# Patient Record
Sex: Female | Born: 1955
Health system: Southern US, Community
[De-identification: ages and names within clinical notes are randomized; demographics above are authoritative.]

## PROBLEM LIST (undated history)

## (undated) DIAGNOSIS — E079 Disorder of thyroid, unspecified: Secondary | ICD-10-CM

## (undated) DIAGNOSIS — E119 Type 2 diabetes mellitus without complications: Secondary | ICD-10-CM

## (undated) DIAGNOSIS — E785 Hyperlipidemia, unspecified: Secondary | ICD-10-CM

## (undated) DIAGNOSIS — I8390 Asymptomatic varicose veins of unspecified lower extremity: Secondary | ICD-10-CM

## (undated) DIAGNOSIS — E039 Hypothyroidism, unspecified: Secondary | ICD-10-CM

## (undated) DIAGNOSIS — C541 Malignant neoplasm of endometrium: Secondary | ICD-10-CM

## (undated) DIAGNOSIS — M8580 Other specified disorders of bone density and structure, unspecified site: Secondary | ICD-10-CM

## (undated) DIAGNOSIS — I10 Essential (primary) hypertension: Secondary | ICD-10-CM

## (undated) DIAGNOSIS — E559 Vitamin D deficiency, unspecified: Secondary | ICD-10-CM

## (undated) DIAGNOSIS — D509 Iron deficiency anemia, unspecified: Secondary | ICD-10-CM

## (undated) DIAGNOSIS — Z780 Asymptomatic menopausal state: Secondary | ICD-10-CM

## (undated) HISTORY — DX: Vitamin D deficiency, unspecified: E55.9

## (undated) HISTORY — DX: Asymptomatic varicose veins of unspecified lower extremity: I83.90

## (undated) HISTORY — DX: Hyperlipidemia, unspecified: E78.5

## (undated) HISTORY — DX: Malignant neoplasm of endometrium: C54.1

## (undated) HISTORY — DX: Asymptomatic menopausal state: Z78.0

## (undated) HISTORY — DX: Iron deficiency anemia, unspecified: D50.9

## (undated) HISTORY — DX: Essential (primary) hypertension: I10

## (undated) HISTORY — DX: Type 2 diabetes mellitus without complications: E11.9

## (undated) HISTORY — DX: Other specified disorders of bone density and structure, unspecified site: M85.80

## (undated) HISTORY — DX: Disorder of thyroid, unspecified: E07.9

---

## 1988-03-04 DIAGNOSIS — I739 Peripheral vascular disease, unspecified: Secondary | ICD-10-CM

## 1988-03-04 HISTORY — DX: Peripheral vascular disease, unspecified: I73.9

## 1997-04-07 ENCOUNTER — Ambulatory Visit (HOSPITAL_COMMUNITY): Admission: RE | Admit: 1997-04-07 | Discharge: 1997-04-07 | Payer: Self-pay | Admitting: *Deleted

## 1997-05-10 ENCOUNTER — Other Ambulatory Visit: Admission: RE | Admit: 1997-05-10 | Discharge: 1997-05-10 | Payer: Self-pay | Admitting: *Deleted

## 2000-10-24 ENCOUNTER — Encounter: Payer: Self-pay | Admitting: Internal Medicine

## 2000-10-24 ENCOUNTER — Ambulatory Visit (HOSPITAL_COMMUNITY): Admission: RE | Admit: 2000-10-24 | Discharge: 2000-10-24 | Payer: Self-pay | Admitting: Internal Medicine

## 2003-09-06 ENCOUNTER — Other Ambulatory Visit: Admission: RE | Admit: 2003-09-06 | Discharge: 2003-09-06 | Payer: Self-pay | Admitting: *Deleted

## 2004-02-15 ENCOUNTER — Encounter: Payer: Self-pay | Admitting: Internal Medicine

## 2004-03-15 ENCOUNTER — Ambulatory Visit (HOSPITAL_COMMUNITY): Admission: RE | Admit: 2004-03-15 | Discharge: 2004-03-15 | Payer: Self-pay | Admitting: Internal Medicine

## 2006-12-09 ENCOUNTER — Other Ambulatory Visit: Admission: RE | Admit: 2006-12-09 | Discharge: 2006-12-09 | Payer: Self-pay | Admitting: Internal Medicine

## 2008-10-18 ENCOUNTER — Encounter: Payer: Self-pay | Admitting: Internal Medicine

## 2008-11-30 ENCOUNTER — Ambulatory Visit: Payer: Self-pay | Admitting: Internal Medicine

## 2008-12-13 ENCOUNTER — Encounter: Payer: Self-pay | Admitting: Internal Medicine

## 2008-12-13 ENCOUNTER — Ambulatory Visit: Payer: Self-pay | Admitting: Internal Medicine

## 2008-12-13 HISTORY — PX: COLONOSCOPY: SHX174

## 2008-12-15 ENCOUNTER — Encounter: Payer: Self-pay | Admitting: Internal Medicine

## 2009-01-04 ENCOUNTER — Ambulatory Visit: Payer: Self-pay | Admitting: Oncology

## 2009-01-16 LAB — CMP (CANCER CENTER ONLY)
ALT(SGPT): 22 U/L (ref 10–47)
Albumin: 3.9 g/dL (ref 3.3–5.5)
CO2: 28 mEq/L (ref 18–33)
Calcium: 9.4 mg/dL (ref 8.0–10.3)
Chloride: 103 mEq/L (ref 98–108)
Creat: 0.6 mg/dl (ref 0.6–1.2)
Potassium: 3.9 mEq/L (ref 3.3–4.7)
Sodium: 139 mEq/L (ref 128–145)
Total Protein: 7.3 g/dL (ref 6.4–8.1)

## 2009-01-16 LAB — MORPHOLOGY - CHCC SATELLITE: PLT EST ~~LOC~~: INCREASED

## 2009-01-16 LAB — CBC WITH DIFFERENTIAL (CANCER CENTER ONLY)
Eosinophils Absolute: 0.1 10*3/uL (ref 0.0–0.5)
LYMPH#: 1.8 10*3/uL (ref 0.9–3.3)
MONO#: 0.4 10*3/uL (ref 0.1–0.9)
NEUT#: 5 10*3/uL (ref 1.5–6.5)
Platelets: 407 10*3/uL — ABNORMAL HIGH (ref 145–400)
RBC: 5.02 10*6/uL (ref 3.70–5.32)
WBC: 7.4 10*3/uL (ref 3.9–10.0)

## 2009-01-18 LAB — ERYTHROPOIETIN: Erythropoietin: 63.3 m[IU]/mL — ABNORMAL HIGH (ref 2.6–34.0)

## 2009-01-18 LAB — PROTEIN ELECTROPHORESIS, SERUM
Alpha-1-Globulin: 4.5 % (ref 2.9–4.9)
Alpha-2-Globulin: 10.2 % (ref 7.1–11.8)
Total Protein, Serum Electrophoresis: 7 g/dL (ref 6.0–8.3)

## 2009-01-18 LAB — RETICULOCYTES (CHCC)
ABS Retic: 51 10*3/uL (ref 19.0–186.0)
RBC.: 5.1 MIL/uL (ref 3.87–5.11)
Retic Ct Pct: 1 % (ref 0.4–3.1)

## 2009-01-18 LAB — VITAMIN B12: Vitamin B-12: 526 pg/mL (ref 211–911)

## 2009-01-18 LAB — LACTATE DEHYDROGENASE: LDH: 143 U/L (ref 94–250)

## 2009-01-18 LAB — HEMOGLOBINOPATHY EVALUATION
Hemoglobin Other: 0 % (ref 0.0–0.0)
Hgb S Quant: 0 % (ref 0.0–0.0)

## 2009-02-15 ENCOUNTER — Emergency Department (HOSPITAL_BASED_OUTPATIENT_CLINIC_OR_DEPARTMENT_OTHER): Admission: EM | Admit: 2009-02-15 | Discharge: 2009-02-15 | Payer: Self-pay | Admitting: Emergency Medicine

## 2009-02-15 ENCOUNTER — Ambulatory Visit: Payer: Self-pay | Admitting: Diagnostic Radiology

## 2009-02-15 IMAGING — CT CT PELVIS W/ CM
2 of 4 series · 16 of 46 positions shown, 18 images · IV contrast (APPLIED)
Comparison: None.

CT ABDOMEN

CLINICAL DATA: Right groin pain, leg pain.

CT ABDOMEN AND PELVIS WITH CONTRAST
TECHNIQUE: Multidetector CT imaging of the abdomen and pelvis was
performed using the standard protocol following bolus
administration of intravenous contrast.
Contrast: 100 ml Omnipaque 300 IV.

[Series 2: abd/pelvis 5.0 b31f · axial · 0.65mm/px · z∈[+894,+1334]mm · 13 of 98 slices shown, 15 images]
[im 5/98  soft-tissue]
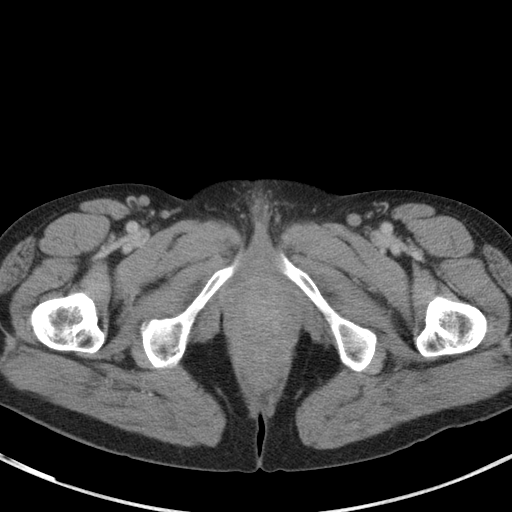
[im 5/98  bone]
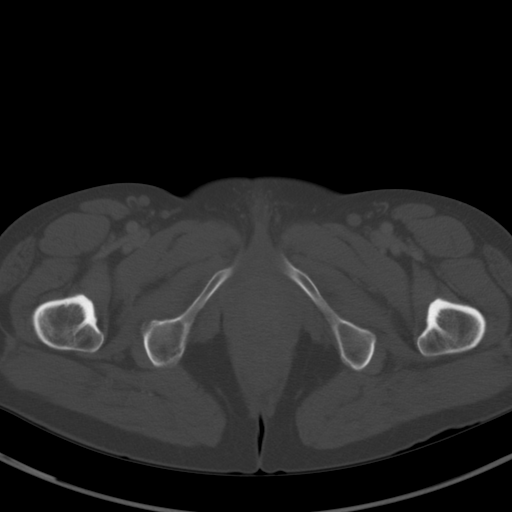
[im 13/98  soft-tissue]
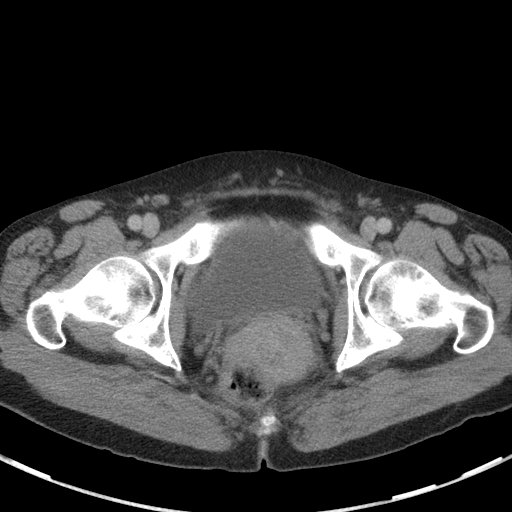
[im 22/98  soft-tissue]
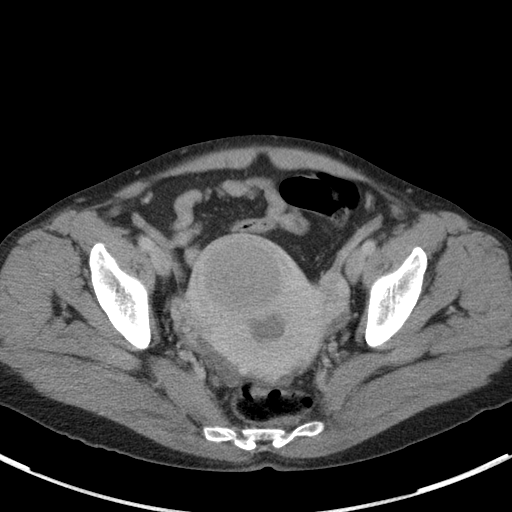
[im 26/98  soft-tissue]
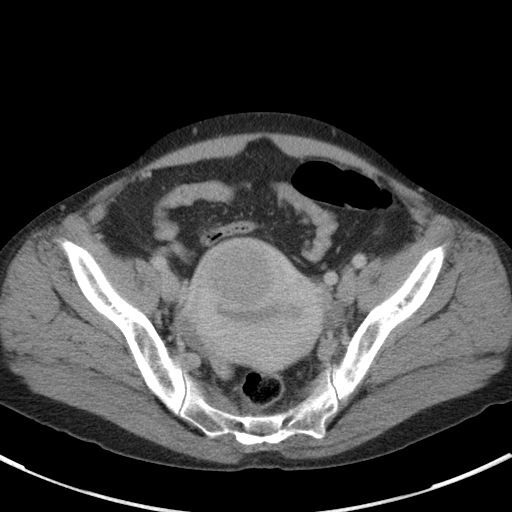
[im 34/98  soft-tissue]
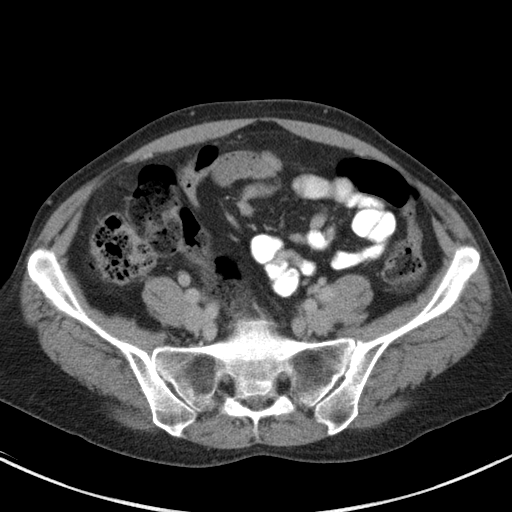
[im 43/98  soft-tissue]
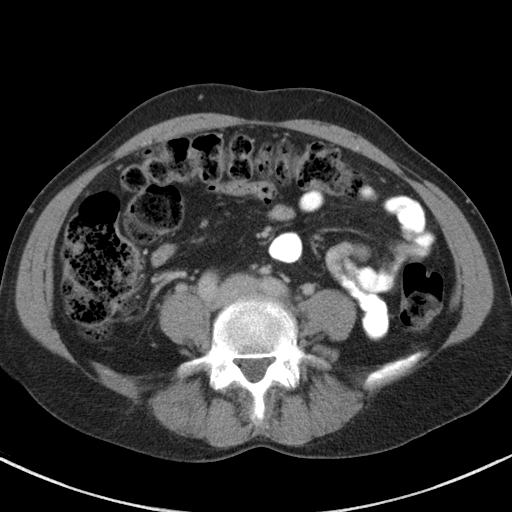
[im 51/98  soft-tissue]
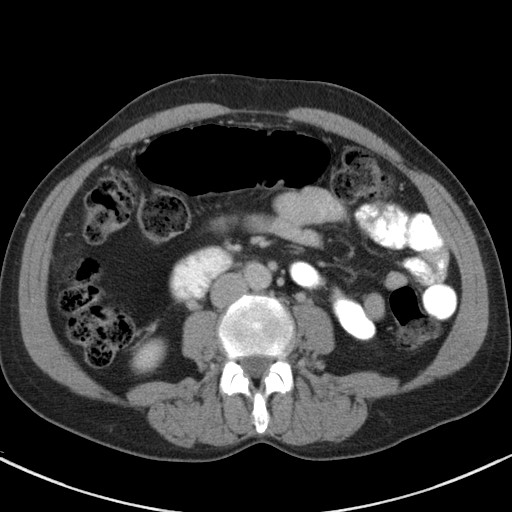
[im 55/98  soft-tissue]
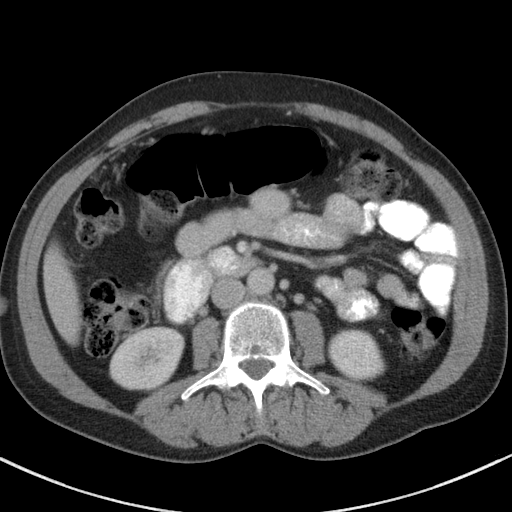
[im 64/98  soft-tissue]
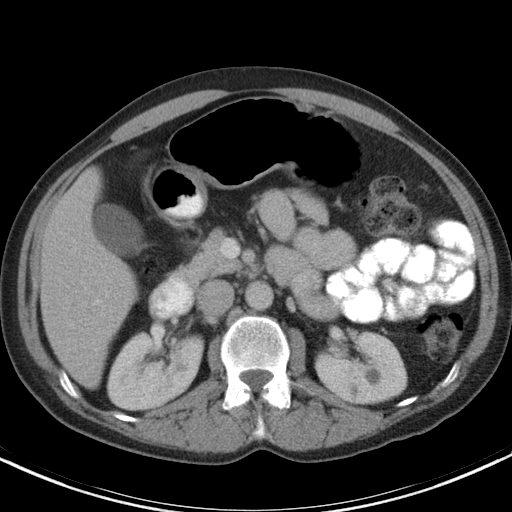
[im 64/98  bone]
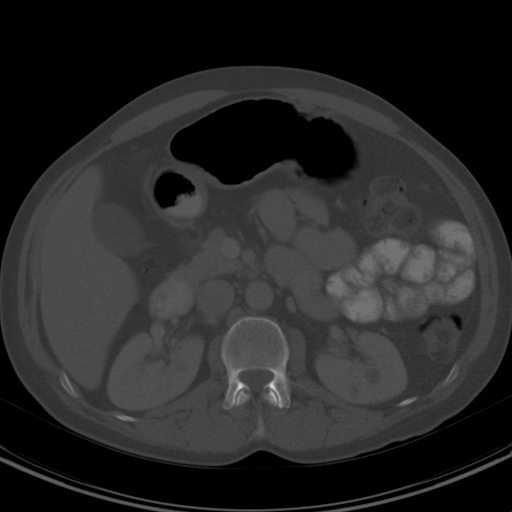
[im 72/98  soft-tissue]
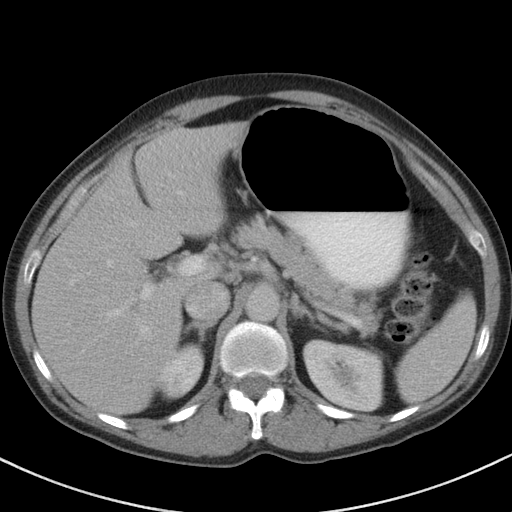
[im 76/98  soft-tissue]
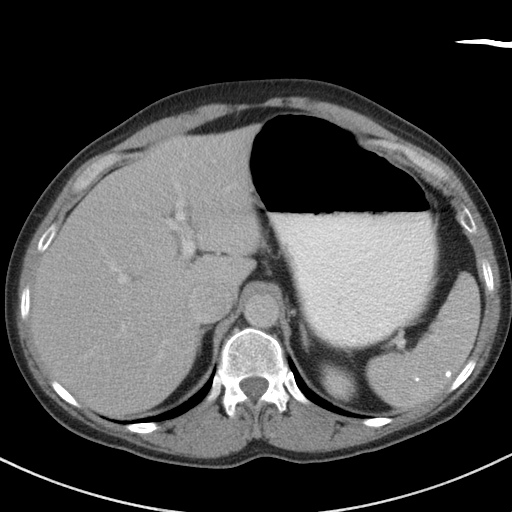
[im 85/98  soft-tissue]
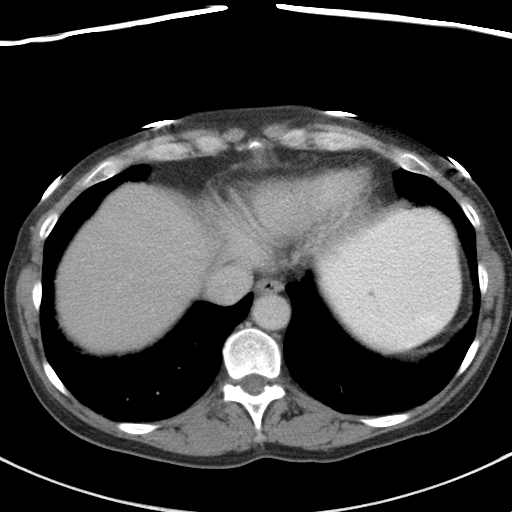
[im 93/98  soft-tissue]
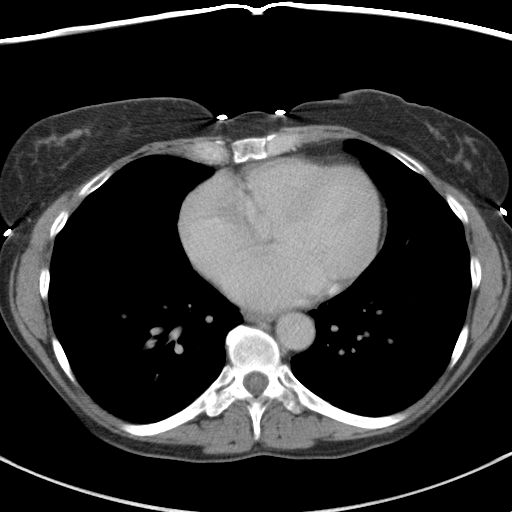

[Series 5: abd/pelvis 3.0 coronal · coronal · 0.67mm/px · 3 of 84 slices shown]
[im 28/84  soft-tissue]
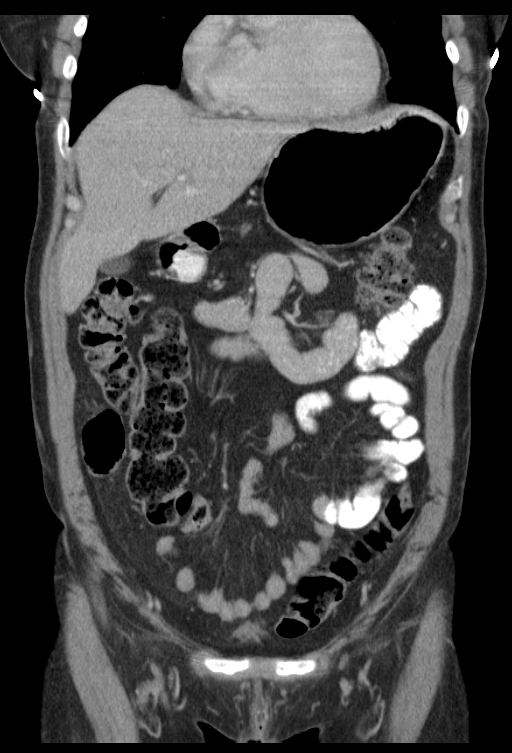
[im 37/84  soft-tissue]
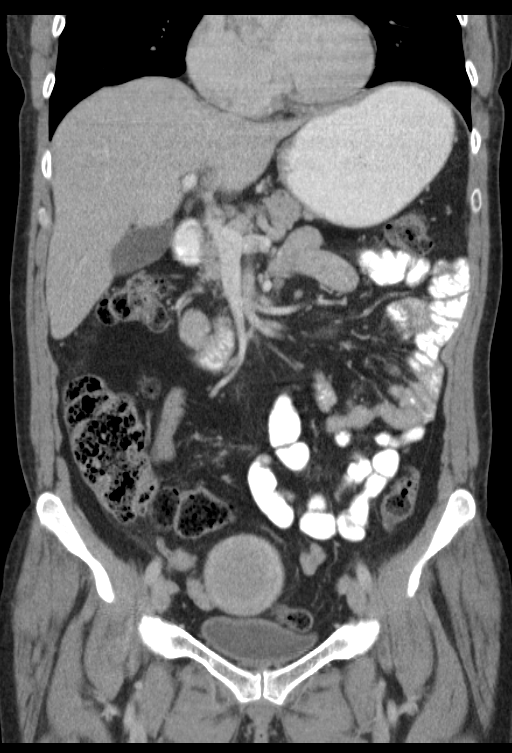
[im 47/84  soft-tissue]
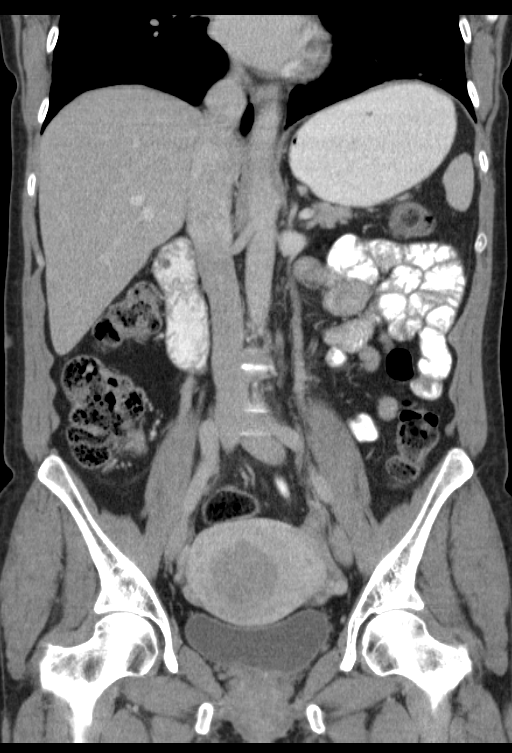

[16 of 46 positions shown; findings below may reference images not displayed]

FINDINGS: Linear atelectasis in the lung bases.  No effusions.
Heart is normal size.

Calcifications seen within the spleen compatible with old
granulomatous disease.  Liver, pancreas, adrenals unremarkable.
Small bilateral low-density lesions within the kidneys, likely
small cysts.  No stones or hydronephrosis.

Gallbladder unremarkable.  Large amount of stool throughout the
colon.  Small bowel unremarkable.

Small scattered retroperitoneal lymph nodes, none pathologically
enlarged.  Aorta is normal caliber.
IMPRESSION: No acute findings in the abdomen.

Old granulomatous disease within the spleen.

Small bilateral renal cysts.

CT PELVIS
FINDINGS: A large hypodense mass within the fundus of the uterus
measuring 5 cm compatible with fibroid. Small exophytic fibroid
also noted. There is fluid within the endometrium.  Small amount of
free fluid in the pelvis.

Ovaries unremarkable.  Appendix is visualized and is normal.
Moderate stool throughout the colon. Pelvic large and small bowel
grossly unremarkable.

No abnormality in the right groin region.  Small scattered
bilateral inguinal lymph nodes.

No acute bony abnormality.
IMPRESSION: Uterine fibroids.

Fluid within the endometrium.

Moderate stool burden throughout the colon.

## 2009-02-21 ENCOUNTER — Ambulatory Visit: Payer: Self-pay | Admitting: Oncology

## 2009-02-23 LAB — CBC WITH DIFFERENTIAL (CANCER CENTER ONLY)
BASO%: 0.6 % (ref 0.0–2.0)
Eosinophils Absolute: 0.1 10*3/uL (ref 0.0–0.5)
HCT: 39.3 % (ref 34.8–46.6)
LYMPH#: 2 10*3/uL (ref 0.9–3.3)
LYMPH%: 30.8 % (ref 14.0–48.0)
MCV: 76 fL — ABNORMAL LOW (ref 81–101)
MONO#: 0.4 10*3/uL (ref 0.1–0.9)
Platelets: 276 10*3/uL (ref 145–400)
RBC: 5.17 10*6/uL (ref 3.70–5.32)
RDW: 23.1 % — ABNORMAL HIGH (ref 10.5–14.6)
WBC: 6.6 10*3/uL (ref 3.9–10.0)

## 2009-03-21 ENCOUNTER — Ambulatory Visit: Payer: Self-pay | Admitting: Oncology

## 2009-05-25 ENCOUNTER — Ambulatory Visit: Payer: Self-pay | Admitting: Oncology

## 2009-05-30 LAB — CBC WITH DIFFERENTIAL (CANCER CENTER ONLY)
BASO%: 0.7 % (ref 0.0–2.0)
EOS%: 1.6 % (ref 0.0–7.0)
LYMPH%: 31.2 % (ref 14.0–48.0)
MCH: 27.8 pg (ref 26.0–34.0)
MCHC: 32.7 g/dL (ref 32.0–36.0)
MCV: 85 fL (ref 81–101)
MONO%: 4.3 % (ref 0.0–13.0)
Platelets: 255 10*3/uL (ref 145–400)
RDW: 12.4 % (ref 10.5–14.6)
WBC: 6.3 10*3/uL (ref 3.9–10.0)

## 2009-05-30 LAB — IRON AND TIBC
%SAT: 32 % (ref 20–55)
Iron: 129 ug/dL (ref 42–145)
TIBC: 406 ug/dL (ref 250–470)
UIBC: 277 ug/dL

## 2009-05-30 LAB — FERRITIN: Ferritin: 20 ng/mL (ref 10–291)

## 2009-06-21 ENCOUNTER — Ambulatory Visit: Payer: Self-pay | Admitting: Oncology

## 2009-06-29 LAB — IRON AND TIBC
%SAT: 80 % — ABNORMAL HIGH (ref 20–55)
Iron: 366 ug/dL — ABNORMAL HIGH (ref 42–145)
TIBC: 457 ug/dL (ref 250–470)

## 2009-06-29 LAB — CBC WITH DIFFERENTIAL (CANCER CENTER ONLY)
BASO#: 0 10*3/uL (ref 0.0–0.2)
EOS%: 1.8 % (ref 0.0–7.0)
Eosinophils Absolute: 0.1 10*3/uL (ref 0.0–0.5)
HCT: 37.3 % (ref 34.8–46.6)
HGB: 12.5 g/dL (ref 11.6–15.9)
LYMPH#: 1.5 10*3/uL (ref 0.9–3.3)
MONO#: 0.3 10*3/uL (ref 0.1–0.9)
NEUT#: 2.8 10*3/uL (ref 1.5–6.5)
NEUT%: 58.9 % (ref 39.6–80.0)
RBC: 4.46 10*6/uL (ref 3.70–5.32)
WBC: 4.8 10*3/uL (ref 3.9–10.0)

## 2009-06-29 LAB — RETICULOCYTES (CHCC)
ABS Retic: 58.1 10*3/uL (ref 19.0–186.0)
RBC.: 4.47 MIL/uL (ref 3.87–5.11)

## 2009-08-07 ENCOUNTER — Ambulatory Visit: Payer: Self-pay | Admitting: Oncology

## 2009-08-11 LAB — CBC WITH DIFFERENTIAL (CANCER CENTER ONLY)
BASO#: 0 10*3/uL (ref 0.0–0.2)
Eosinophils Absolute: 0.1 10*3/uL (ref 0.0–0.5)
HCT: 38.8 % (ref 34.8–46.6)
LYMPH%: 30.5 % (ref 14.0–48.0)
MCH: 27.7 pg (ref 26.0–34.0)
MCV: 84 fL (ref 81–101)
MONO#: 0.4 10*3/uL (ref 0.1–0.9)
MONO%: 5.8 % (ref 0.0–13.0)
NEUT%: 61.7 % (ref 39.6–80.0)
Platelets: 264 10*3/uL (ref 145–400)
RBC: 4.65 10*6/uL (ref 3.70–5.32)

## 2009-08-11 LAB — IRON AND TIBC
%SAT: 19 % — ABNORMAL LOW (ref 20–55)
Iron: 72 ug/dL (ref 42–145)

## 2009-08-11 LAB — FERRITIN: Ferritin: 11 ng/mL (ref 10–291)

## 2009-11-02 ENCOUNTER — Ambulatory Visit: Payer: Self-pay | Admitting: Oncology

## 2009-12-22 ENCOUNTER — Ambulatory Visit: Payer: Self-pay | Admitting: Oncology

## 2009-12-28 LAB — CBC WITH DIFFERENTIAL (CANCER CENTER ONLY)
BASO%: 0.5 % (ref 0.0–2.0)
EOS%: 1.9 % (ref 0.0–7.0)
LYMPH#: 2.1 10*3/uL (ref 0.9–3.3)
MCH: 28.9 pg (ref 26.0–34.0)
MCHC: 34.1 g/dL (ref 32.0–36.0)
MONO%: 6.2 % (ref 0.0–13.0)
NEUT#: 3.4 10*3/uL (ref 1.5–6.5)
NEUT%: 56.9 % (ref 39.6–80.0)
RDW: 12.1 % (ref 10.5–14.6)

## 2009-12-28 LAB — IRON AND TIBC: TIBC: 428 ug/dL (ref 250–470)

## 2010-02-05 ENCOUNTER — Ambulatory Visit: Payer: Self-pay | Admitting: Oncology

## 2010-02-06 LAB — CBC WITH DIFFERENTIAL/PLATELET
Basophils Absolute: 0 10*3/uL (ref 0.0–0.1)
EOS%: 0.7 % (ref 0.0–7.0)
Eosinophils Absolute: 0.1 10*3/uL (ref 0.0–0.5)
HCT: 39.2 % (ref 34.8–46.6)
HGB: 13 g/dL (ref 11.6–15.9)
MCH: 29.2 pg (ref 25.1–34.0)
MCV: 88.1 fL (ref 79.5–101.0)
MONO%: 6.2 % (ref 0.0–14.0)
NEUT%: 71.2 % (ref 38.4–76.8)

## 2010-02-06 LAB — FERRITIN: Ferritin: 84 ng/mL (ref 10–291)

## 2010-02-06 LAB — IRON AND TIBC: TIBC: 318 ug/dL (ref 250–470)

## 2010-03-25 ENCOUNTER — Encounter: Payer: Self-pay | Admitting: Internal Medicine

## 2010-05-24 ENCOUNTER — Encounter (HOSPITAL_BASED_OUTPATIENT_CLINIC_OR_DEPARTMENT_OTHER): Payer: BC Managed Care – PPO | Admitting: Oncology

## 2010-05-24 ENCOUNTER — Other Ambulatory Visit: Payer: Self-pay | Admitting: Oncology

## 2010-05-24 DIAGNOSIS — E039 Hypothyroidism, unspecified: Secondary | ICD-10-CM

## 2010-05-24 LAB — CBC WITH DIFFERENTIAL/PLATELET
BASO%: 0.4 % (ref 0.0–2.0)
Eosinophils Absolute: 0 10*3/uL (ref 0.0–0.5)
HCT: 39 % (ref 34.8–46.6)
LYMPH%: 24.4 % (ref 14.0–49.7)
MONO#: 0.5 10*3/uL (ref 0.1–0.9)
NEUT#: 4.4 10*3/uL (ref 1.5–6.5)
Platelets: 257 10*3/uL (ref 145–400)
RBC: 4.48 10*6/uL (ref 3.70–5.45)
WBC: 6.6 10*3/uL (ref 3.9–10.3)
lymph#: 1.6 10*3/uL (ref 0.9–3.3)

## 2010-05-24 LAB — IRON AND TIBC
%SAT: 35 % (ref 20–55)
TIBC: 401 ug/dL (ref 250–470)
UIBC: 260 ug/dL

## 2010-05-24 LAB — FERRITIN: Ferritin: 8 ng/mL — ABNORMAL LOW (ref 10–291)

## 2010-06-05 LAB — URINALYSIS, ROUTINE W REFLEX MICROSCOPIC
Nitrite: NEGATIVE
Protein, ur: NEGATIVE mg/dL
Specific Gravity, Urine: 1.016 (ref 1.005–1.030)
Urobilinogen, UA: 0.2 mg/dL (ref 0.0–1.0)

## 2010-06-05 LAB — DIFFERENTIAL
Basophils Absolute: 0 10*3/uL (ref 0.0–0.1)
Basophils Relative: 0 % (ref 0–1)
Lymphocytes Relative: 10 % — ABNORMAL LOW (ref 12–46)
Neutro Abs: 7.7 10*3/uL (ref 1.7–7.7)
Neutrophils Relative %: 88 % — ABNORMAL HIGH (ref 43–77)

## 2010-06-05 LAB — BASIC METABOLIC PANEL
BUN: 16 mg/dL (ref 6–23)
Calcium: 9.9 mg/dL (ref 8.4–10.5)
Creatinine, Ser: 0.7 mg/dL (ref 0.4–1.2)
GFR calc non Af Amer: >60 mL/min (ref >60)
Glucose, Bld: 137 mg/dL — ABNORMAL HIGH (ref 70–99)
Sodium: 143 mEq/L (ref 135–145)

## 2010-06-05 LAB — CBC
Platelets: 247 10*3/uL (ref 150–400)
RDW: 24.6 % — ABNORMAL HIGH (ref 11.5–15.5)

## 2010-06-05 LAB — PREGNANCY, URINE: Preg Test, Ur: NEGATIVE

## 2010-06-05 LAB — URINE MICROSCOPIC-ADD ON

## 2010-06-07 ENCOUNTER — Other Ambulatory Visit: Payer: Self-pay | Admitting: Oncology

## 2010-06-07 ENCOUNTER — Encounter (HOSPITAL_BASED_OUTPATIENT_CLINIC_OR_DEPARTMENT_OTHER): Payer: BC Managed Care – PPO | Admitting: Oncology

## 2010-06-07 DIAGNOSIS — D509 Iron deficiency anemia, unspecified: Secondary | ICD-10-CM

## 2010-06-07 DIAGNOSIS — E039 Hypothyroidism, unspecified: Secondary | ICD-10-CM

## 2010-06-07 LAB — CBC & DIFF AND RETIC
BASO%: 0.8 % (ref 0.0–2.0)
EOS%: 0.8 % (ref 0.0–7.0)
Immature Retic Fract: 2.4 % (ref 0.00–10.70)
LYMPH%: 26.1 % (ref 14.0–49.7)
MCH: 28.2 pg (ref 25.1–34.0)
MCHC: 33.2 g/dL (ref 31.5–36.0)
MONO#: 0.6 10*3/uL (ref 0.1–0.9)
Platelets: 266 10*3/uL (ref 145–400)
RBC: 4.93 10*6/uL (ref 3.70–5.45)
WBC: 9.1 10*3/uL (ref 3.9–10.3)
lymph#: 2.4 10*3/uL (ref 0.9–3.3)
nRBC: 0 % (ref 0–0)

## 2010-06-07 LAB — IRON AND TIBC
%SAT: 72 % — ABNORMAL HIGH (ref 20–55)
TIBC: 897 ug/dL — ABNORMAL HIGH (ref 250–470)

## 2010-06-14 ENCOUNTER — Encounter (HOSPITAL_BASED_OUTPATIENT_CLINIC_OR_DEPARTMENT_OTHER): Payer: BC Managed Care – PPO | Admitting: Oncology

## 2010-06-14 DIAGNOSIS — D509 Iron deficiency anemia, unspecified: Secondary | ICD-10-CM

## 2010-08-20 ENCOUNTER — Emergency Department (INDEPENDENT_AMBULATORY_CARE_PROVIDER_SITE_OTHER): Payer: BC Managed Care – PPO

## 2010-08-20 ENCOUNTER — Emergency Department (HOSPITAL_BASED_OUTPATIENT_CLINIC_OR_DEPARTMENT_OTHER)
Admission: EM | Admit: 2010-08-20 | Discharge: 2010-08-20 | Disposition: A | Discharge status: Home / Self Care | Payer: BC Managed Care – PPO | Attending: Emergency Medicine | Admitting: Emergency Medicine

## 2010-08-20 DIAGNOSIS — R079 Chest pain, unspecified: Secondary | ICD-10-CM

## 2010-08-20 DIAGNOSIS — E039 Hypothyroidism, unspecified: Secondary | ICD-10-CM | POA: Insufficient documentation

## 2010-08-20 DIAGNOSIS — M79609 Pain in unspecified limb: Secondary | ICD-10-CM | POA: Insufficient documentation

## 2010-08-20 LAB — CK TOTAL AND CKMB (NOT AT ARMC)
CK, MB: 3 ng/mL (ref 0.3–4.0)
Relative Index: INVALID (ref 0.0–2.5)
Total CK: 92 U/L (ref 7–177)

## 2010-08-20 LAB — DIFFERENTIAL
Basophils Absolute: 0 10*3/uL (ref 0.0–0.1)
Basophils Relative: 0 % (ref 0–1)
Lymphocytes Relative: 26 % (ref 12–46)
Monocytes Absolute: 0.4 10*3/uL (ref 0.1–1.0)
Neutro Abs: 4.1 10*3/uL (ref 1.7–7.7)
Neutrophils Relative %: 67 % (ref 43–77)

## 2010-08-20 LAB — BASIC METABOLIC PANEL
Creatinine, Ser: 0.5 mg/dL (ref 0.50–1.10)
Glucose, Bld: 107 mg/dL — ABNORMAL HIGH (ref 70–99)
Potassium: 3.7 mEq/L (ref 3.5–5.1)
Sodium: 140 mEq/L (ref 135–145)

## 2010-08-20 LAB — TROPONIN I: Troponin I: <0.3 ng/mL (ref <0.30)

## 2010-08-20 LAB — CBC
HCT: 26.1 % — ABNORMAL LOW (ref 36.0–46.0)
Hemoglobin: 8.6 g/dL — ABNORMAL LOW (ref 12.0–15.0)
RBC: 3.05 MIL/uL — ABNORMAL LOW (ref 3.87–5.11)
WBC: 6.1 10*3/uL (ref 4.0–10.5)

## 2010-09-12 ENCOUNTER — Encounter (HOSPITAL_BASED_OUTPATIENT_CLINIC_OR_DEPARTMENT_OTHER): Payer: BC Managed Care – PPO | Admitting: Oncology

## 2010-09-12 ENCOUNTER — Other Ambulatory Visit: Payer: Self-pay | Admitting: Oncology

## 2010-09-12 DIAGNOSIS — D509 Iron deficiency anemia, unspecified: Secondary | ICD-10-CM

## 2010-09-12 DIAGNOSIS — E039 Hypothyroidism, unspecified: Secondary | ICD-10-CM

## 2010-09-12 LAB — CBC & DIFF AND RETIC
BASO%: 0.6 % (ref 0.0–2.0)
HCT: 37.5 % (ref 34.8–46.6)
LYMPH%: 26.6 % (ref 14.0–49.7)
MCH: 28.5 pg (ref 25.1–34.0)
MCHC: 31.7 g/dL (ref 31.5–36.0)
MONO#: 0.5 10*3/uL (ref 0.1–0.9)
NEUT%: 64.5 % (ref 38.4–76.8)
Platelets: 270 10*3/uL (ref 145–400)
WBC: 6.7 10*3/uL (ref 3.9–10.3)

## 2010-09-12 LAB — IRON AND TIBC
%SAT: 16 % — ABNORMAL LOW (ref 20–55)
TIBC: 390 ug/dL (ref 250–470)
UIBC: 328 ug/dL

## 2010-09-12 LAB — FERRITIN: Ferritin: 14 ng/mL (ref 10–291)

## 2010-12-13 ENCOUNTER — Other Ambulatory Visit: Payer: Self-pay | Admitting: Oncology

## 2010-12-13 ENCOUNTER — Encounter (HOSPITAL_BASED_OUTPATIENT_CLINIC_OR_DEPARTMENT_OTHER): Payer: BC Managed Care – PPO | Admitting: Oncology

## 2010-12-13 DIAGNOSIS — D509 Iron deficiency anemia, unspecified: Secondary | ICD-10-CM

## 2010-12-13 DIAGNOSIS — E039 Hypothyroidism, unspecified: Secondary | ICD-10-CM

## 2010-12-13 LAB — CBC WITH DIFFERENTIAL/PLATELET
Eosinophils Absolute: 0.1 10*3/uL (ref 0.0–0.5)
HCT: 36.8 % (ref 34.8–46.6)
HGB: 12 g/dL (ref 11.6–15.9)
LYMPH%: 33.2 % (ref 14.0–49.7)
MONO#: 0.5 10*3/uL (ref 0.1–0.9)
NEUT#: 2.5 10*3/uL (ref 1.5–6.5)
NEUT%: 53.8 % (ref 38.4–76.8)
Platelets: 307 10*3/uL (ref 145–400)
RBC: 4.26 10*6/uL (ref 3.70–5.45)
WBC: 4.7 10*3/uL (ref 3.9–10.3)

## 2010-12-13 LAB — IRON AND TIBC
%SAT: 44 % (ref 20–55)
TIBC: 413 ug/dL (ref 250–470)

## 2010-12-13 LAB — FERRITIN: Ferritin: 8 ng/mL — ABNORMAL LOW (ref 10–291)

## 2010-12-19 ENCOUNTER — Other Ambulatory Visit: Payer: Self-pay | Admitting: Obstetrics & Gynecology

## 2010-12-19 DIAGNOSIS — R928 Other abnormal and inconclusive findings on diagnostic imaging of breast: Secondary | ICD-10-CM

## 2010-12-27 ENCOUNTER — Other Ambulatory Visit: Payer: BC Managed Care – PPO

## 2011-01-16 ENCOUNTER — Telehealth: Payer: Self-pay | Admitting: *Deleted

## 2011-01-16 ENCOUNTER — Ambulatory Visit
Admission: RE | Admit: 2011-01-16 | Discharge: 2011-01-16 | Disposition: A | Discharge status: Home / Self Care | Payer: BC Managed Care – PPO | Source: Ambulatory Visit | Attending: Obstetrics & Gynecology | Admitting: Obstetrics & Gynecology

## 2011-01-16 DIAGNOSIS — R928 Other abnormal and inconclusive findings on diagnostic imaging of breast: Secondary | ICD-10-CM

## 2011-01-16 NOTE — Telephone Encounter (Signed)
Mailed out calendar to inform patient of the new date and time of the 07-19-2011 appointment

## 2011-01-17 ENCOUNTER — Encounter: Payer: Self-pay | Admitting: Oncology

## 2011-01-17 ENCOUNTER — Other Ambulatory Visit: Payer: Self-pay | Admitting: Oncology

## 2011-01-17 DIAGNOSIS — D509 Iron deficiency anemia, unspecified: Secondary | ICD-10-CM

## 2011-01-17 HISTORY — DX: Iron deficiency anemia, unspecified: D50.9

## 2011-01-18 ENCOUNTER — Telehealth: Payer: Self-pay | Admitting: *Deleted

## 2011-01-18 NOTE — Telephone Encounter (Signed)
GAVE PATIENT APPOINTMENT FOR 07-2011.  

## 2011-01-21 ENCOUNTER — Telehealth: Payer: Self-pay | Admitting: Oncology

## 2011-01-21 NOTE — Telephone Encounter (Signed)
called pt lmovm for appt on 11/23

## 2011-01-22 ENCOUNTER — Telehealth: Payer: Self-pay | Admitting: Oncology

## 2011-01-22 ENCOUNTER — Telehealth: Payer: Self-pay | Admitting: *Deleted

## 2011-01-22 NOTE — Telephone Encounter (Signed)
Per MD notified pt of appt on 01/25/11 at 1015 for Fereheme Injection. Request pt call back to confirm msg was received.

## 2011-01-22 NOTE — Telephone Encounter (Signed)
pt called and confirmed appt for 11/23

## 2011-01-25 ENCOUNTER — Ambulatory Visit (HOSPITAL_BASED_OUTPATIENT_CLINIC_OR_DEPARTMENT_OTHER): Payer: BC Managed Care – PPO

## 2011-01-25 VITALS — BP 115/74 | HR 65 | Temp 98.3°F

## 2011-01-25 DIAGNOSIS — D509 Iron deficiency anemia, unspecified: Secondary | ICD-10-CM

## 2011-01-25 MED ORDER — FERUMOXYTOL INJECTION 510 MG/17 ML
510.0000 mg | Freq: Once | INTRAVENOUS | Status: AC
  Administered 2011-01-25: 510 mg via INTRAVENOUS
  Filled 2011-01-25: qty 17

## 2011-01-25 MED ORDER — SODIUM CHLORIDE 0.9 % IV SOLN
Freq: Once | INTRAVENOUS | Status: AC
  Administered 2011-01-25: 10:00:00 via INTRAVENOUS

## 2011-01-25 NOTE — Patient Instructions (Signed)
1037-Pt discharged ambulatory with next appointment confirmed.  Pt aware to call with any questions or concerns.

## 2011-02-13 ENCOUNTER — Other Ambulatory Visit: Payer: Self-pay | Admitting: Obstetrics & Gynecology

## 2011-07-18 ENCOUNTER — Other Ambulatory Visit: Payer: Self-pay | Admitting: Family

## 2011-07-18 DIAGNOSIS — D509 Iron deficiency anemia, unspecified: Secondary | ICD-10-CM

## 2011-07-19 ENCOUNTER — Other Ambulatory Visit (HOSPITAL_BASED_OUTPATIENT_CLINIC_OR_DEPARTMENT_OTHER): Payer: BC Managed Care – PPO | Admitting: Lab

## 2011-07-19 ENCOUNTER — Telehealth: Payer: Self-pay | Admitting: *Deleted

## 2011-07-19 ENCOUNTER — Encounter: Payer: Self-pay | Admitting: Family

## 2011-07-19 ENCOUNTER — Ambulatory Visit (HOSPITAL_BASED_OUTPATIENT_CLINIC_OR_DEPARTMENT_OTHER): Payer: BC Managed Care – PPO | Admitting: Family

## 2011-07-19 VITALS — BP 127/89 | HR 76 | Temp 98.6°F | Ht 64.7 in | Wt 141.3 lb

## 2011-07-19 DIAGNOSIS — D509 Iron deficiency anemia, unspecified: Secondary | ICD-10-CM

## 2011-07-19 DIAGNOSIS — E039 Hypothyroidism, unspecified: Secondary | ICD-10-CM

## 2011-07-19 LAB — CBC WITH DIFFERENTIAL/PLATELET
BASO%: 1 % (ref 0.0–2.0)
EOS%: 0.7 % (ref 0.0–7.0)
HGB: 14 g/dL (ref 11.6–15.9)
MCH: 28.9 pg (ref 25.1–34.0)
MCHC: 33.3 g/dL (ref 31.5–36.0)
RBC: 4.84 10*6/uL (ref 3.70–5.45)
RDW: 12.8 % (ref 11.2–14.5)
lymph#: 1.4 10*3/uL (ref 0.9–3.3)
nRBC: 0 % (ref 0–0)

## 2011-07-19 LAB — IRON AND TIBC
Iron: 78 ug/dL (ref 42–145)
UIBC: 287 ug/dL (ref 125–400)

## 2011-07-19 NOTE — Progress Notes (Signed)
[facility]  Name: Mary Frank                  DATE: 07/19/2011 MRN: [id number]                      DOB: 09-Jun-1955  REFERRING PHYSICIAN: Nadean Corwin,*  DIAGNOSIS: Patient Active Problem List  Diagnoses Date Noted  . Iron deficiency anemia 01/17/2011     Encounter Diagnosis  Name Primary?  . Iron deficiency anemia Yes    PREVIOUS THERAPY: Feraheme as indicated, last received 01/25/11.   CURRENT THERAPY: Oral iron 325 mg daily.   INTERIM HISTORY: Feels well, states she can usually tell when her iron is low, and that she does not feel low at present. Denies dyspnea or fatigue. No headache or blurred vision. No cough, no abdominal pain or bone pain. Bowel and bladder function are normal, no constipation or GI upset from oral iron. Appetite is good, with adequate fluid intake. Remainder of the 10 point  review of systems is negative.  PHYSICAL EXAM: BP 127/89  Pulse 76  Temp(Src) 98.6 F (37 C) (Oral)  Ht 5' 4.7" (1.643 m)  Wt 141 lb 4.8 oz (64.093 kg)  BMI 23.73 kg/m2 General: Well developed, well nourished, in no acute distress.  EENT: No ocular or oral lesions. No stomatitis.  Respiratory: Lungs are clear to auscultation bilaterally with normal respiratory movement and no accessory muscle use. Cardiac: No murmur, rub or tachycardia. No upper or lower extremity edema.  GI: Abdomen is soft, no palpable hepatosplenomegaly. No fluid wave. No tenderness. Musculoskeletal: No kyphosis, no tenderness over the spine, ribs or hips. Lymph: No cervical, infraclavicular, axillary or inguinal adenopathy. Neuro: No focal neurological deficits. Psych: Alert and oriented X 3, appropriate mood and affect.    LABORATORY STUDIES:   Results for orders placed in visit on 07/19/11  CBC WITH DIFFERENTIAL      Component Value Range   WBC 6.2  3.9 - 10.3 (10e3/uL)   NEUT# 4.2  1.5 - 6.5 (10e3/uL)   HGB 14.0  11.6 - 15.9 (g/dL)   HCT 14.7  82.9 - 56.2 (%)   Platelets  231  145 - 400 (10e3/uL)   MCV 86.8  79.5 - 101.0 (fL)   MCH 28.9  25.1 - 34.0 (pg)   MCHC 33.3  31.5 - 36.0 (g/dL)   RBC 1.30  8.65 - 7.84 (10e6/uL)   RDW 12.8  11.2 - 14.5 (%)   lymph# 1.4  0.9 - 3.3 (10e3/uL)   MONO# 0.5  0.1 - 0.9 (10e3/uL)   Eosinophils Absolute 0.0  0.0 - 0.5 (10e3/uL)   Basophils Absolute 0.1  0.0 - 0.1 (10e3/uL)   NEUT% 68.3  38.4 - 76.8 (%)   LYMPH% 22.7  14.0 - 49.7 (%)   MONO% 7.3  0.0 - 14.0 (%)   EOS% 0.7  0.0 - 7.0 (%)   BASO% 1.0  0.0 - 2.0 (%)   nRBC 0  0 - 0 (%)    IMPRESSION:  56 y/o female with:  1. History iron deficiency anemia, requiring Feraheme infusion in the past.  2. Blood counts stable, possibly attributable to oral iron intake.  3. Abnormal mammogram October 2012, ultrasound showed no area suspicious for malignancy. Short term (6 month) follow-up recommended.   PLAN:   1. Continue on oral iron. 2. Return in 6 months for lab and appt with Dr. Welton Flakes.  3. She prefers to follow-up  with primary care to arrange short interval follow-up breast ultrasound as [facility].   DISCUSSION:

## 2011-07-19 NOTE — Telephone Encounter (Signed)
gave patient appointment for 01-13-2012 starting at 10:00am printed out calendar and gave to the patient

## 2011-10-22 ENCOUNTER — Encounter: Payer: Self-pay | Admitting: Internal Medicine

## 2012-01-13 ENCOUNTER — Other Ambulatory Visit (HOSPITAL_BASED_OUTPATIENT_CLINIC_OR_DEPARTMENT_OTHER): Payer: BC Managed Care – PPO | Admitting: Lab

## 2012-01-13 ENCOUNTER — Telehealth: Payer: Self-pay | Admitting: Oncology

## 2012-01-13 ENCOUNTER — Encounter: Payer: Self-pay | Admitting: Oncology

## 2012-01-13 ENCOUNTER — Ambulatory Visit (HOSPITAL_BASED_OUTPATIENT_CLINIC_OR_DEPARTMENT_OTHER): Payer: BC Managed Care – PPO | Admitting: Oncology

## 2012-01-13 VITALS — BP 171/82 | HR 74 | Temp 98.1°F | Resp 20 | Ht 64.5 in | Wt 142.5 lb

## 2012-01-13 DIAGNOSIS — D509 Iron deficiency anemia, unspecified: Secondary | ICD-10-CM

## 2012-01-13 LAB — CBC WITH DIFFERENTIAL/PLATELET
BASO%: 0.6 % (ref 0.0–2.0)
Basophils Absolute: 0 10*3/uL (ref 0.0–0.1)
EOS%: 0.6 % (ref 0.0–7.0)
HCT: 41.9 % (ref 34.8–46.6)
HGB: 13.7 g/dL (ref 11.6–15.9)
LYMPH%: 21.8 % (ref 14.0–49.7)
MCH: 28.4 pg (ref 25.1–34.0)
MCHC: 32.7 g/dL (ref 31.5–36.0)
NEUT%: 70.2 % (ref 38.4–76.8)
Platelets: 242 10*3/uL (ref 145–400)

## 2012-01-13 NOTE — Patient Instructions (Addendum)
Doing well labs look stable  We will see you back in May 2014

## 2012-01-13 NOTE — Progress Notes (Signed)
OFFICE PROGRESS NOTE  CC  MCKEOWN,WILLIAM DAVID, MD [address] [place] [address]  DIAGNOSIS: 56 year old female with iron deficiency anemia  PRIOR THERAPY:  #1 patient has received parenteral iron in the past. She is also on oral iron daily.  CURRENT THERAPY: Oral iron 325 mg daily  INTERVAL HISTORY: Mary Frank 56 y.o. female returns for followup visit at 6 months. Clinically she seems to be doing well without any significant complaints. She is denying any fevers chills night sweats headaches shortness of breath chest pains palpitations no bowel or bladder changes. No hematuria hematochezia or melena hemoptysis or hematemesis. Remainder of the 10 point review of systems is negative.  MEDICAL HISTORY: Past Medical History  Diagnosis Date  . Iron deficiency anemia 01/17/2011    ALLERGIES:   has no known allergies.  MEDICATIONS:  Current Outpatient Prescriptions  Medication Sig Dispense Refill  . Magnesium 100 MG CAPS Take 1 each by mouth.      . ferrous fumarate (HEMOCYTE - 106 MG FE) 325 (106 FE) MG TABS Take 1 tablet by mouth daily.      Marland Kitchen PREMARIN 0.625 MG tablet       . progesterone (PROMETRIUM) 200 MG capsule         SURGICAL HISTORY: No past surgical history on file.  REVIEW OF SYSTEMS:  Pertinent items are noted in HPI.   HEALTH MAINTENANCE:  PHYSICAL EXAMINATION: Blood pressure 171/82, pulse 74, temperature 98.1 F (36.7 C), temperature source Oral, resp. rate 20, height 5' 4.5" (1.638 m), weight 142 lb 8 oz (64.638 kg). Body mass index is 24.08 kg/(m^2). ECOG PERFORMANCE STATUS: 0 - Asymptomatic   General appearance: alert, cooperative and appears stated age Lymph nodes: Cervical, supraclavicular, and axillary nodes normal. Resp: clear to auscultation bilaterally Back: symmetric, no curvature. ROM normal. No CVA tenderness. Cardio: regular rate and rhythm GI: soft, non-tender; bowel sounds normal; no masses,  no  organomegaly Extremities: extremities normal, atraumatic, no cyanosis or edema Neurologic: Grossly normal   LABORATORY DATA: Lab Results  Component Value Date   WBC 7.2 01/13/2012   HGB 13.7 01/13/2012   HCT 41.9 01/13/2012   MCV 86.7 01/13/2012   PLT 242 01/13/2012      Chemistry      Component Value Date/Time   NA 140 08/20/2010 1310   NA 139 01/16/2009 1135   K 3.7 08/20/2010 1310   K 3.9 01/16/2009 1135   CL 106 08/20/2010 1310   CL 103 01/16/2009 1135   CO2 24 08/20/2010 1310   CO2 28 01/16/2009 1135   BUN 13 08/20/2010 1310   BUN 13 01/16/2009 1135   CREATININE 0.50 08/20/2010 1310   CREATININE 0.6 01/16/2009 1135      Component Value Date/Time   CALCIUM 8.9 08/20/2010 1310   CALCIUM 9.4 01/16/2009 1135   ALKPHOS 120* 01/16/2009 1135   AST 19 01/16/2009 1135   BILITOT 0.40 01/16/2009 1135       RADIOGRAPHIC STUDIES:  No results found.  ASSESSMENT: 56 year old female with iron deficiency anemia. Patient is on oral iron 325 mg daily. However in order to keep with her losses we'll also give her parenteral iron as needed to keep her ferritin above 100. Her hemoglobin today looks great iron studies are pending.   PLAN:   #1 we will followup on her iron studies. If her ferritin is low we will go ahead and call her and get her set up for fera heme.  #2 she will be set  up to see [place] back in 6 months time with iron studies and a CBC.   All questions were answered. The patient knows to call the clinic with any problems, questions or concerns. We can certainly see the patient much sooner if necessary.  I spent 15 minutes counseling the patient face to face. The total time spent in the appointment was 30 minutes.    Drue Second, MD Medical/Oncology [facility] [phone] (beeper) [phone] (Office)  01/13/2012, 10:35 AM

## 2012-01-13 NOTE — Telephone Encounter (Signed)
gve the pt her may 2014 appt calendar 

## 2012-06-25 ENCOUNTER — Encounter: Payer: Self-pay | Admitting: Internal Medicine

## 2012-07-16 ENCOUNTER — Other Ambulatory Visit: Payer: BC Managed Care – PPO | Admitting: Lab

## 2012-07-16 ENCOUNTER — Ambulatory Visit: Payer: BC Managed Care – PPO | Admitting: Adult Health

## 2013-02-03 ENCOUNTER — Encounter: Payer: Self-pay | Admitting: Internal Medicine

## 2013-02-04 ENCOUNTER — Ambulatory Visit (INDEPENDENT_AMBULATORY_CARE_PROVIDER_SITE_OTHER): Payer: BC Managed Care – PPO | Admitting: Internal Medicine

## 2013-02-04 ENCOUNTER — Encounter: Payer: Self-pay | Admitting: Internal Medicine

## 2013-02-04 VITALS — BP 118/70 | HR 66 | Temp 97.9°F | Resp 16 | Ht 64.75 in | Wt 141.8 lb

## 2013-02-04 DIAGNOSIS — Z1212 Encounter for screening for malignant neoplasm of rectum: Secondary | ICD-10-CM

## 2013-02-04 DIAGNOSIS — Z113 Encounter for screening for infections with a predominantly sexual mode of transmission: Secondary | ICD-10-CM

## 2013-02-04 DIAGNOSIS — E119 Type 2 diabetes mellitus without complications: Secondary | ICD-10-CM

## 2013-02-04 DIAGNOSIS — Z79899 Other long term (current) drug therapy: Secondary | ICD-10-CM

## 2013-02-04 DIAGNOSIS — E559 Vitamin D deficiency, unspecified: Secondary | ICD-10-CM

## 2013-02-04 DIAGNOSIS — E139 Other specified diabetes mellitus without complications: Secondary | ICD-10-CM | POA: Insufficient documentation

## 2013-02-04 DIAGNOSIS — E039 Hypothyroidism, unspecified: Secondary | ICD-10-CM

## 2013-02-04 DIAGNOSIS — R7402 Elevation of levels of lactic acid dehydrogenase (LDH): Secondary | ICD-10-CM

## 2013-02-04 DIAGNOSIS — E1169 Type 2 diabetes mellitus with other specified complication: Secondary | ICD-10-CM | POA: Insufficient documentation

## 2013-02-04 DIAGNOSIS — I10 Essential (primary) hypertension: Secondary | ICD-10-CM

## 2013-02-04 DIAGNOSIS — E782 Mixed hyperlipidemia: Secondary | ICD-10-CM

## 2013-02-04 DIAGNOSIS — R7309 Other abnormal glucose: Secondary | ICD-10-CM

## 2013-02-04 DIAGNOSIS — Z0000 Encounter for general adult medical examination without abnormal findings: Secondary | ICD-10-CM

## 2013-02-04 HISTORY — DX: Type 2 diabetes mellitus without complications: E11.9

## 2013-02-04 LAB — LIPID PANEL
Cholesterol: 176 mg/dL (ref 0–200)
HDL: 70 mg/dL (ref >39)
LDL Cholesterol: 84 mg/dL (ref 0–99)
Triglycerides: 111 mg/dL (ref <150)
VLDL: 22 mg/dL (ref 0–40)

## 2013-02-04 LAB — CBC WITH DIFFERENTIAL/PLATELET
Basophils Absolute: 0 10*3/uL (ref 0.0–0.1)
Basophils Relative: 1 % (ref 0–1)
HCT: 41.3 % (ref 36.0–46.0)
Lymphocytes Relative: 29 % (ref 12–46)
MCHC: 34.4 g/dL (ref 30.0–36.0)
Neutro Abs: 4.8 10*3/uL (ref 1.7–7.7)
Neutrophils Relative %: 62 % (ref 43–77)
Platelets: 293 10*3/uL (ref 150–400)
RDW: 13.7 % (ref 11.5–15.5)
WBC: 7.5 10*3/uL (ref 4.0–10.5)

## 2013-02-04 LAB — BASIC METABOLIC PANEL WITH GFR
BUN: 15 mg/dL (ref 6–23)
Calcium: 9.4 mg/dL (ref 8.4–10.5)
Chloride: 101 mEq/L (ref 96–112)
Creat: 0.66 mg/dL (ref 0.50–1.10)
GFR, Est African American: >89 mL/min
GFR, Est Non African American: >89 mL/min

## 2013-02-04 LAB — HEPATIC FUNCTION PANEL
ALT: 25 U/L (ref 0–35)
Bilirubin, Direct: 0.1 mg/dL (ref 0.0–0.3)
Indirect Bilirubin: 0.4 mg/dL (ref 0.0–0.9)
Total Bilirubin: 0.5 mg/dL (ref 0.3–1.2)

## 2013-02-04 LAB — HEMOGLOBIN A1C: Mean Plasma Glucose: 194 mg/dL — ABNORMAL HIGH (ref <117)

## 2013-02-04 MED ORDER — METFORMIN HCL ER 500 MG PO TB24: ORAL_TABLET | ORAL | Status: DC

## 2013-02-04 MED ORDER — MEDROXYPROGESTERONE ACETATE 5 MG PO TABS: ORAL_TABLET | ORAL | Status: DC

## 2013-02-04 NOTE — Patient Instructions (Addendum)

## 2013-02-04 NOTE — Progress Notes (Signed)
Patient ID: Mary Frank, female   DOB: 10/06/1955, 57 y.o.   MRN: [id number]  Annual Screening Comprehensive Examination  This very nice 57 yo WWF presents for complete physical.  Patient has been followed for HTN (2004),  T2 Diabetes ( 2003), Hyperlipidemia, and Vitamin D Deficiency.   Patient's BP has been controlled at home. Patient denies any cardiac symptoms as chest pain, palpitations, shortness of breath, dizziness or ankle swelling.   Patient's hyperlipidemia is controlled with diet and medications. Patient denies myalgias or other medication SE's. Last cholesterol last visit was 164, triglycerides   170, HDL 60 and LDL 70 at goal.     Patient has T2 DM predating from 2003 with last A1c 8.5% in September admitting poor dietary compliance at that time. Patient denies reactive hypoglycemic symptoms, visual blurring, diabetic polys, or paresthesias.    She was Dx'd and Tx'd for Graves' Dz with RAI-131 in 1991 and has been on replacement therapy since.   Finally, patient has history of Vitamin D Deficiency with last vitamin D of 70 in September.     Medication Sig Dispense Refill  . atorvastatin (LIPITOR) 80 MG tablet Take 40 mg by mouth daily.      . cholecalciferol (VITAMIN D) 1000 UNITS tablet Take 4,000 Units by mouth daily.      . ferrous fumarate (HEMOCYTE - 106 MG FE) 325 (106 FE) MG TABS Take 1 tablet by mouth daily.      Marland Kitchen levothyroxine (SYNTHROID, LEVOTHROID) 100 MCG tablet Take 200 mcg by mouth daily before breakfast. Take 200 5 times a week. Take 100 2 times a week.      . Magnesium 100 MG CAPS Take 1 each by mouth.      Marland Kitchen PREMARIN 0.625 MG tablet       . progesterone (PROMETRIUM) 200 MG capsule          No Known Allergies  Past Medical History  Diagnosis Date  . Iron deficiency anemia 01/17/2011  . Hypertension   . Hyperlipidemia   . Diabetes mellitus without complication   . Thyroid disease   . Vitamin D deficiency     Past Surgical History  Procedure  Laterality Date  . Vein ligation and stripping Left     Family History  Problem Relation Age of Onset  . Diabetes Mother   . Thyroid disease Mother   . Diabetes Father   . Osteoporosis Father   . Thyroid disease Sister     History  Substance Use Topics  . Smoking status: Former Smoker -- 1.00 packs/day    Quit date: 03/20/2010  . Smokeless tobacco: Not on file  . Alcohol Use: Yes    ROS Constitutional: Denies fever, chills, weight loss/gain, headaches, insomnia, fatigue, night sweats, and change in appetite. Eyes: Denies redness, blurred vision, diplopia, discharge, itchy, watery eyes.  ENT: Denies discharge, congestion, post nasal drip, epistaxis, sore throat, earache, hearing loss, dental pain, Tinnitus, Vertigo, Sinus pain, snoring.  Cardio: Denies chest pain, palpitations, irregular heartbeat, syncope, dyspnea, diaphoresis, orthopnea, PND, claudication, edema Respiratory: denies cough, dyspnea, DOE, pleurisy, hoarseness, laryngitis, wheezing.  Gastrointestinal: Denies dysphagia, heartburn, reflux, water brash, pain, cramps, nausea, vomiting, bloating, diarrhea, constipation, hematemesis, melena, hematochezia, jaundice, hemorrhoids Genitourinary: Denies dysuria, frequency, urgency, nocturia, hesitancy, discharge, hematuria, flank pain Breast:Breast lumps, nipple discharge, bleeding.  Musculoskeletal: Denies arthralgia, myalgia, stiffness, Jt. Swelling, pain, limp, and strain/sprain. Skin: Denies puritis, rash, hives, warts, acne, eczema, changing in skin lesion Neuro: No weakness, tremor, incoordination, spasms, paresthesia,  pain Psychiatric: Denies confusion, memory loss, sensory loss Endocrine: Denies change in weight, skin, hair change, nocturia, and paresthesia, diabetic polys, visual blurring, hyper / hypo glycemic episodes.  Heme/Lymph: No excessive bleeding, bruising, enlarged lymph nodes.  Filed Vitals:   02/04/13 1002  BP: 118/70  Pulse: 66  Temp: 97.9 F (36.6 C)   Resp: 16   Estimated body mass index is 23.77 kg/(m^2) as calculated from the following:   Height as of this encounter: 5' 4.75" (1.645 m).   Weight as of this encounter: 141 lb 12.8 oz (64.32 kg).  Physical Exam General Appearance: Well nourished, in no apparent distress. Eyes: PERRLA, EOMs, conjunctiva no swelling or erythema, normal fundi and vessels. Sinuses: No frontal/maxillary tenderness ENT/Mouth: EACs patent / TMs  nl. Nares clear without erythema, swelling, mucoid exudates. Oral hygiene is good. No erythema, swelling, or exudate. Tongue normal, non-obstructing. Tonsils not swollen or erythematous. Hearing normal.  Neck: Supple, thyroid normal. No bruits, nodes or JVD. Respiratory: Respiratory effort normal.  BS equal and clear bilateral without rales, rhonci, wheezing or stridor. Cardio: Heart sounds are normal with regular rate and rhythm and no murmurs, rubs or gallops. Peripheral pulses are normal and equal bilaterally without edema. No aortic or femoral bruits. Chest: symmetric with normal excursions and percussion. Breasts: Deferred to Dr Jennette Kettle (has upcoming appt) Abdomen: Flat, soft, with bowl sounds. Nontender, no guarding, rebound, hernias, masses, or organomegaly.  Lymphatics: Non tender without lymphadenopathy.  Genitourinary: Deferred to Dr Jennette Kettle. Musculoskeletal: Full ROM all peripheral extremities, joint stability, 5/5 strength, and normal gait. Skin: Warm and dry without rashes, lesions, cyanosis, clubbing or  ecchymosis.  Neuro: Cranial nerves intact, reflexes equal bilaterally. Normal muscle tone, no cerebellar symptoms. Sensation intact.  Pysch: Awake and oriented X 3, normal affect, Insight and Judgment appropriate.   Assessment and Plan  1. Annual Screening Examination 2. Hypertension  3. Hyperlipidemia 4. T2 Diabetes 5. Vitamin D Deficiency  Continue prudent diet as discussed, weight control, regular exercise, and medications. Discussed med's effects and  SE's. Screening labs and tests as requested with regular follow-up as recommended.

## 2013-02-05 LAB — URINALYSIS, MICROSCOPIC ONLY
Bacteria, UA: NONE SEEN
Casts: NONE SEEN
Crystals: NONE SEEN
Squamous Epithelial / LPF: NONE SEEN

## 2013-02-05 LAB — RPR

## 2013-02-05 LAB — MICROALBUMIN / CREATININE URINE RATIO: Creatinine, Urine: 108.8 mg/dL

## 2013-02-05 LAB — HIV ANTIBODY (ROUTINE TESTING W REFLEX): HIV: NONREACTIVE

## 2013-02-05 LAB — HEPATITIS B CORE ANTIBODY, TOTAL: Hep B Core Total Ab: NONREACTIVE

## 2013-02-05 LAB — INSULIN, FASTING: Insulin fasting, serum: 4 u[IU]/mL (ref 3–28)

## 2013-02-05 LAB — HEPATITIS B E ANTIBODY: Hepatitis Be Antibody: NEGATIVE

## 2013-02-05 LAB — HEPATITIS C ANTIBODY: HCV Ab: NEGATIVE

## 2013-02-13 ENCOUNTER — Other Ambulatory Visit: Payer: Self-pay | Admitting: Internal Medicine

## 2013-03-04 HISTORY — PX: ENDOMETRIAL ABLATION W/ NOVASURE: SUR434

## 2013-05-06 ENCOUNTER — Other Ambulatory Visit: Payer: Self-pay | Admitting: Physician Assistant

## 2013-05-07 ENCOUNTER — Ambulatory Visit: Payer: Self-pay | Admitting: Physician Assistant

## 2013-05-12 NOTE — Telephone Encounter (Signed)
LMOM TO CALL  

## 2013-05-18 ENCOUNTER — Ambulatory Visit (INDEPENDENT_AMBULATORY_CARE_PROVIDER_SITE_OTHER): Payer: BC Managed Care – PPO | Admitting: Emergency Medicine

## 2013-05-18 ENCOUNTER — Encounter: Payer: Self-pay | Admitting: Emergency Medicine

## 2013-05-18 VITALS — BP 122/80 | HR 68 | Temp 98.0°F | Resp 18 | Ht 64.5 in | Wt 146.0 lb

## 2013-05-18 DIAGNOSIS — E782 Mixed hyperlipidemia: Secondary | ICD-10-CM

## 2013-05-18 DIAGNOSIS — E119 Type 2 diabetes mellitus without complications: Secondary | ICD-10-CM

## 2013-05-18 DIAGNOSIS — I10 Essential (primary) hypertension: Secondary | ICD-10-CM

## 2013-05-18 DIAGNOSIS — E039 Hypothyroidism, unspecified: Secondary | ICD-10-CM

## 2013-05-18 LAB — CBC WITH DIFFERENTIAL/PLATELET
Basophils Absolute: 0.1 10*3/uL (ref 0.0–0.1)
Basophils Relative: 1 % (ref 0–1)
EOS ABS: 0.2 10*3/uL (ref 0.0–0.7)
Eosinophils Relative: 3 % (ref 0–5)
HCT: 39.5 % (ref 36.0–46.0)
Hemoglobin: 13.5 g/dL (ref 12.0–15.0)
Lymphocytes Relative: 28 % (ref 12–46)
Lymphs Abs: 2.2 10*3/uL (ref 0.7–4.0)
MCH: 28.2 pg (ref 26.0–34.0)
MCHC: 34.2 g/dL (ref 30.0–36.0)
MCV: 82.6 fL (ref 78.0–100.0)
MONO ABS: 0.5 10*3/uL (ref 0.1–1.0)
MONOS PCT: 6 % (ref 3–12)
Neutro Abs: 5 10*3/uL (ref 1.7–7.7)
Neutrophils Relative %: 62 % (ref 43–77)
PLATELETS: 323 10*3/uL (ref 150–400)
RBC: 4.78 MIL/uL (ref 3.87–5.11)
RDW: 13.2 % (ref 11.5–15.5)
WBC: 8 10*3/uL (ref 4.0–10.5)

## 2013-05-18 LAB — HEMOGLOBIN A1C
Hgb A1c MFr Bld: 7.7 % — ABNORMAL HIGH (ref <5.7)
Mean Plasma Glucose: 174 mg/dL — ABNORMAL HIGH (ref <117)

## 2013-05-18 MED ORDER — DAPAGLIFLOZIN PROPANEDIOL 5 MG PO TABS: 5.0000 mg | ORAL_TABLET | Freq: Every day | ORAL | Status: DC

## 2013-05-18 NOTE — Patient Instructions (Signed)
We want weight loss that will last so you should lose 1-2 pounds a week.  THAT IS IT! Please pick THREE things a month to change. Once it is a habit check off the item. Then pick another three items off the list to become habits.  If you are already doing a habit on the list GREAT!  Cross that item off! o Don't drink your calories. Ie, alcohol, soda, fruit juice, and sweet tea.  o Drink more water. Drink a glass when you feel hungry or before each meal.  o Eat breakfast - Complex carb and protein (likeDannon light and fit yogurt, oatmeal, fruit, eggs, [place] bacon). o Measure your cereal.  Eat no more than one cup a day. (ie [place]) o Eat an apple a day. o Add a vegetable a day. o Try a new vegetable a month. o Use Pam! Stop using oil or butter to cook. o Don't finish your plate or use smaller plates. o Share your dessert. o Eat sugar free Jello for dessert or frozen grapes. o Don't eat 2-3 hours before bed. o Switch to whole wheat bread, pasta, and brown rice. o Make healthier choices when you eat out. No fries! o Pick baked chicken, NOT fried. o Don't forget to SLOW DOWN when you eat. It is not going anywhere.  o Take the stairs. o Park far away in the parking lot o [employer] (or weights) for 10 minutes while watching TV. o Walk at work for 10 minutes during break. o Walk outside 1 time a week with your friend, kids, dog, or significant other. o Start a walking group at [place] the mall as much as you can tolerate.  o Keep a food diary. o Weigh yourself daily. o Walk for 15 minutes 3 days per week. o Cook at home more often and eat out less.  If life happens and you go back to old habits, it is okay.  Just start over. You can do it!   If you experience chest pain, get short of breath, or tired during the exercise, please stop immediately and inform your doctor.    Bad carbs also include fruit juice, alcohol, and sweet tea. These are empty calories that do not signal to  your brain that you are full.   Please remember the good carbs are still carbs which convert into sugar. So please measure them out no more than 1/2-1 cup of rice, oatmeal, pasta, and beans.  Veggies are however free foods! Pile them on.   I like lean protein at every meal such as chicken, [place], pork chops, cottage cheese, etc. Just do not fry these meats and please center your meal around vegetable, the meats should be a side dish.   No all fruit is created equal. Please see the list below, the fruit at the bottom is higher in sugars than the fruit at the top   Diabetes Meal Planning Guide The diabetes meal planning guide is a tool to help you plan your meals and snacks. It is important for people with diabetes to manage their blood glucose (sugar) levels. Choosing the right foods and the right amounts throughout your day will help control your blood glucose. Eating right can even help you improve your blood pressure and reach or maintain a healthy weight. CARBOHYDRATE COUNTING MADE EASY When you eat carbohydrates, they turn to sugar. This raises your blood glucose level. Counting carbohydrates can help you control this level so you feel better.  When you plan your meals by counting carbohydrates, you can have more flexibility in what you eat and balance your medicine with your food intake. Carbohydrate counting simply means adding up the total amount of carbohydrate grams in your meals and snacks. Try to eat about the same amount at each meal. Foods with carbohydrates are listed below. Each portion below is 1 carbohydrate serving or 15 grams of carbohydrates. Ask your dietician how many grams of carbohydrates you should eat at each meal or snack. Grains and Starches  1 slice bread.   English muffin or hotdog/hamburger bun.   cup cold cereal (unsweetened).   cup cooked pasta or rice.   cup starchy vegetables (corn, potatoes, peas, beans, winter squash).  1 tortilla (6 inches).    bagel.  1 waffle or pancake (size of a CD).   cup cooked cereal.  4 to 6 small crackers. *Whole grain is recommended. Fruit  1 cup fresh unsweetened berries, melon, papaya, pineapple.  1 small fresh fruit.   banana or mango.   cup fruit juice (4 oz unsweetened).   cup canned fruit in natural juice or water.  2 tbs dried fruit.  12 to 15 grapes or cherries. Milk and Yogurt  1 cup fat-free or 1% milk.  1 cup soy milk.  6 oz light yogurt with sugar-free sweetener.  6 oz low-fat soy yogurt.  6 oz plain yogurt. Vegetables  1 cup raw or  cup cooked is counted as 0 carbohydrates or a "free" food.  If you eat 3 or more servings at 1 meal, count them as 1 carbohydrate serving. Other Carbohydrates   oz chips or pretzels.   cup ice cream or frozen yogurt.   cup sherbet or sorbet.  2 inch square cake, no frosting.  1 tbs honey, sugar, jam, jelly, or syrup.  2 small cookies.  3 squares of graham crackers.  3 cups popcorn.  6 crackers.  1 cup broth-based soup.  Count 1 cup casserole or other mixed foods as 2 carbohydrate servings.  Foods with less than 20 calories in a serving may be counted as 0 carbohydrates or a "free" food. You may want to purchase a book or computer software that lists the carbohydrate gram counts of different foods. In addition, the nutrition facts panel on the labels of the foods you eat are a good source of this information. The label will tell you how big the serving size is and the total number of carbohydrate grams you will be eating per serving. Divide this number by 15 to obtain the number of carbohydrate servings in a portion. Remember, 1 carbohydrate serving equals 15 grams of carbohydrate. SERVING SIZES Measuring foods and serving sizes helps you make sure you are getting the right amount of food. The list below tells how big or small some common serving sizes are.  1 oz.........4 stacked dice.  3 oz........Marland KitchenDeck of  cards.  1 tsp.......Marland KitchenTip of little finger.  1 tbs......Marland KitchenMarland KitchenThumb.  2 tbs.......Marland KitchenGolf ball.   cup......Marland KitchenHalf of a fist.  1 cup.......Marland KitchenA fist. SAMPLE DIABETES MEAL PLAN Below is a sample meal plan that includes foods from the grain and starches, dairy, vegetable, fruit, and meat groups. A dietician can individualize a meal plan to fit your calorie needs and tell you the number of servings needed from each food group. However, controlling the total amount of carbohydrates in your meal or snack is more important than making sure you include all of the food groups at every meal. You  may interchange carbohydrate containing foods (dairy, starches, and fruits). The meal plan below is an example of a 2000 calorie diet using carbohydrate counting. This meal plan has 17 carbohydrate servings. Breakfast  1 cup oatmeal (2 carb servings).   cup light yogurt (1 carb serving).  1 cup blueberries (1 carb serving).   cup almonds. Snack  1 large apple (2 carb servings).  1 low-fat string cheese stick. Lunch  Chicken breast salad.  1 cup spinach.   cup chopped tomatoes.  2 oz chicken breast, sliced.  2 tbs low-fat [place] dressing.  12 whole-wheat crackers (2 carb servings).  12 to 15 grapes (1 carb serving).  1 cup low-fat milk (1 carb serving). Snack  1 cup carrots.   cup hummus (1 carb serving). Dinner  3 oz broiled salmon.  1 cup brown rice (3 carb servings). Snack  1  cups steamed broccoli (1 carb serving) drizzled with 1 tsp olive oil and lemon juice.  1 cup light pudding (2 carb servings). DIABETES MEAL PLANNING WORKSHEET Your dietician can use this worksheet to help you decide how many servings of foods and what types of foods are right for you.  BREAKFAST Food Group and Servings / Carb Servings Grain/Starches __________________________________ Dairy __________________________________________ Vegetable ______________________________________ Fruit  ___________________________________________ Meat __________________________________________ Fat ____________________________________________ LUNCH Food Group and Servings / Carb Servings Grain/Starches ___________________________________ Dairy ___________________________________________ Fruit ____________________________________________ Meat ___________________________________________ Fat _____________________________________________ [place] Food Group and Servings / Carb Servings Grain/Starches ___________________________________ Dairy ___________________________________________ Fruit ____________________________________________ Meat ___________________________________________ Fat _____________________________________________ SNACKS Food Group and Servings / Carb Servings Grain/Starches ___________________________________ Dairy ___________________________________________ Vegetable _______________________________________ Fruit ____________________________________________ Meat ___________________________________________ Fat _____________________________________________ DAILY TOTALS Starches _________________________ Vegetable ________________________ Fruit ____________________________ Dairy ____________________________ Meat ____________________________ Fat ______________________________ Document Released: 11/15/2004 Document Revised: 05/13/2011 Document Reviewed: 09/26/2008 [employer] Patient Information 2014 [employer]. Diabetes and Foot Care Diabetes may cause you to have problems because of poor blood supply (circulation) to your feet and legs. This may cause the skin on your feet to become thinner, break easier, and heal more slowly. Your skin may become dry, and the skin may peel and crack. You may also have nerve damage in your legs and feet causing decreased feeling in them. You may not notice minor injuries to your feet that could lead to infections or more serious  problems. Taking care of your feet is one of the most important things you can do for yourself.  HOME CARE INSTRUCTIONS  Wear shoes at all times, even in the house. Do not go barefoot. Bare feet are easily injured.  Check your feet daily for blisters, cuts, and redness. If you cannot see the bottom of your feet, use a mirror or ask someone for help.  Wash your feet with warm water (do not use hot water) and mild soap. Then pat your feet and the areas between your toes until they are completely dry. Do not soak your feet as this can dry your skin.  Apply a moisturizing lotion or petroleum jelly (that does not contain alcohol and is unscented) to the skin on your feet and to dry, brittle toenails. Do not apply lotion between your toes.  Trim your toenails straight across. Do not dig under them or around the cuticle. File the edges of your nails with an emery board or nail file.  Do not cut corns or calluses or try to remove them with medicine.  Wear clean socks or stockings every day. Make sure they are not too tight. Do not wear knee-high stockings since  they may decrease blood flow to your legs.  Wear shoes that fit properly and have enough cushioning. To break in new shoes, wear them for just a few hours a day. This prevents you from injuring your feet. Always look in your shoes before you put them on to be sure there are no objects inside.  Do not cross your legs. This may decrease the blood flow to your feet.  If you find a minor scrape, cut, or break in the skin on your feet, keep it and the skin around it clean and dry. These areas may be cleansed with mild soap and water. Do not cleanse the area with peroxide, alcohol, or iodine.  When you remove an adhesive bandage, be sure not to damage the skin around it.  If you have a wound, look at it several times a day to make sure it is healing.  Do not use heating pads or hot water bottles. They may burn your skin. If you have lost feeling  in your feet or legs, you may not know it is happening until it is too late.  Make sure your health care provider performs a complete foot exam at least annually or more often if you have foot problems. Report any cuts, sores, or bruises to your health care provider immediately. SEEK MEDICAL CARE IF:   You have an injury that is not healing.  You have cuts or breaks in the skin.  You have an ingrown nail.  You notice redness on your legs or feet.  You feel burning or tingling in your legs or feet.  You have pain or cramps in your legs and feet.  Your legs or feet are numb.  Your feet always feel cold. SEEK IMMEDIATE MEDICAL CARE IF:   There is increasing redness, swelling, or pain in or around a wound.  There is a red line that goes up your leg.  Pus is coming from a wound.  You develop a fever or as directed by your health care provider.  You notice a bad smell coming from an ulcer or wound. Document Released: 02/16/2000 Document Revised: 10/21/2012 Document Reviewed: 07/28/2012 [facility] Patient Information 2014 [place].

## 2013-05-18 NOTE — Progress Notes (Signed)
Subjective:    Patient ID: Mary Frank, female    DOB: 14-Nov-1955, 58 y.o.   MRN: [id number]  HPI Comments: 58 yo female presents for 3 month F/U for HTN, Cholesterol, DM, D. Deficient. She has been trying to eat healthier and exercises routinely. She notes BP good at home. She notes she is not checking BS. She checks feet routinely and denies skin break down or neuropathy increase. She is concerned because she keeps gaining weight. She is concerned about thyroid dose change.  Last labs CHOL         176   02/04/2013 HDL           70   02/04/2013 LDLCALC       84   02/04/2013 TRIG         111   02/04/2013 CHOLHDL      2.5   02/04/2013 ALT           25   02/04/2013 AST           14   02/04/2013 ALKPHOS       85   02/04/2013 BILITOT      0.5   02/04/2013 CREATININE     0.66   02/04/2013 BUN              15   02/04/2013 NA              138   02/04/2013 K               4.5   02/04/2013 CL              101   02/04/2013 CO2              29   02/04/2013 HGBA1C      8.4   02/04/2013 WBC      7.5   02/04/2013 HGB     14.2   02/04/2013 HCT     41.3   02/04/2013 MCV     81.8   02/04/2013 PLT      293   02/04/2013   Hyperlipidemia    Current Outpatient Prescriptions on File Prior to Visit  Medication Sig Dispense Refill  . atorvastatin (LIPITOR) 80 MG tablet Take 40 mg by mouth daily.      . cholecalciferol (VITAMIN D) 1000 UNITS tablet Take 4,000 Units by mouth daily.      Marland Kitchen estradiol (ESTRACE) 2 MG tablet Take 2 mg by mouth daily.       . ferrous fumarate (HEMOCYTE - 106 MG FE) 325 (106 FE) MG TABS Take 1 tablet by mouth daily.      Marland Kitchen levothyroxine (SYNTHROID, LEVOTHROID) 100 MCG tablet Take 200 mcg by mouth daily before breakfast. Takes 1/2 = 50 mcg daily      . lisinopril (PRINIVIL,ZESTRIL) 10 MG tablet take 1/2 or 1 tablet by mouth once daily  90 tablet  0  . medroxyPROGESTERone (PROVERA) 5 MG tablet Take 1 tablet 10 days every 3 months      . metFORMIN (GLUCOPHAGE-XR) 500 MG 24 hr tablet take 1  tablet by mouth WITH BREAKFAST and 1 AT LUNCH and 2 WITH SUPPER  120 tablet  2  . PREMARIN 0.625 MG tablet 0.625 mg daily.        No current facility-administered medications on file prior to visit.   No Known Allergies Past Medical History  Diagnosis Date  . Iron deficiency anemia 01/17/2011  . Hypertension   .  Hyperlipidemia   . Diabetes mellitus without complication   . Thyroid disease   . Vitamin D deficiency      Review of Systems  Constitutional: Positive for unexpected weight change.  All other systems reviewed and are negative.   BP 122/80  Pulse 68  Temp(Src) 98 F (36.7 C) (Temporal)  Resp 18  Ht 5' 4.5" (1.638 m)  Wt 146 lb (66.225 kg)  BMI 24.68 kg/m2  LMP 05/18/2013     Objective:   Physical Exam  Nursing note and vitals reviewed. Constitutional: She is oriented to person, place, and time. She appears well-developed and well-nourished. No distress.  HENT:  Head: Normocephalic and atraumatic.  Right Ear: External ear normal.  Left Ear: External ear normal.  Nose: Nose normal.  Mouth/Throat: Oropharynx is clear and moist.  Eyes: Conjunctivae and EOM are normal.  Neck: Normal range of motion. Neck supple. No JVD present. No thyromegaly present.  Cardiovascular: Normal rate, regular rhythm, normal heart sounds and intact distal pulses.   Pulmonary/Chest: Effort normal and breath sounds normal.  Abdominal: Soft. Bowel sounds are normal. She exhibits no distension and no mass. There is no tenderness. There is no rebound and no guarding.  Musculoskeletal: Normal range of motion. She exhibits no edema and no tenderness.  Lymphadenopathy:    She has no cervical adenopathy.  Neurological: She is alert and oriented to person, place, and time. No cranial nerve deficit.  Skin: Skin is warm and dry. No rash noted. No erythema. No pallor.  Callus left foot nickel size  Psychiatric: She has a normal mood and affect. Her behavior is normal. Judgment and thought  content normal.          Assessment & Plan:  1.  3 month F/U for HTN, Cholesterol,DM, D. Deficient. Needs healthy diet, cardio QD and obtain healthy weight. Check Labs, Check BP if >130/80 call office, Check BS if >200 call office. Advise dof foot hygiene, will monitor and call with results Farxiga 5 mg Samples #42 Hold pending labs  2. Hypothyroid with concern for weight gain- recheck labs

## 2013-05-19 ENCOUNTER — Encounter: Payer: Self-pay | Admitting: Emergency Medicine

## 2013-05-19 LAB — BASIC METABOLIC PANEL WITH GFR
BUN: 14 mg/dL (ref 6–23)
CALCIUM: 9.9 mg/dL (ref 8.4–10.5)
CHLORIDE: 102 meq/L (ref 96–112)
CO2: 28 mEq/L (ref 19–32)
CREATININE: 0.66 mg/dL (ref 0.50–1.10)
GFR, Est African American: >89 mL/min
GFR, Est Non African American: >89 mL/min
GLUCOSE: 162 mg/dL — AB (ref 70–99)
POTASSIUM: 4.6 meq/L (ref 3.5–5.3)
Sodium: 137 mEq/L (ref 135–145)

## 2013-05-19 LAB — INSULIN, FASTING: INSULIN FASTING, SERUM: 3 u[IU]/mL (ref 3–28)

## 2013-05-19 LAB — HEPATIC FUNCTION PANEL
ALT: 23 U/L (ref 0–35)
AST: 14 U/L (ref 0–37)
Albumin: 4.3 g/dL (ref 3.5–5.2)
Alkaline Phosphatase: 86 U/L (ref 39–117)
Bilirubin, Direct: 0.1 mg/dL (ref 0.0–0.3)
Indirect Bilirubin: 0.3 mg/dL (ref 0.2–1.2)
Total Bilirubin: 0.4 mg/dL (ref 0.2–1.2)
Total Protein: 6.5 g/dL (ref 6.0–8.3)

## 2013-05-19 LAB — LIPID PANEL
CHOL/HDL RATIO: 2.8 ratio
Cholesterol: 171 mg/dL (ref 0–200)
HDL: 62 mg/dL (ref >39)
LDL Cholesterol: 80 mg/dL (ref 0–99)
Triglycerides: 143 mg/dL (ref <150)
VLDL: 29 mg/dL (ref 0–40)

## 2013-05-19 LAB — TSH: TSH: 11.514 u[IU]/mL — ABNORMAL HIGH (ref 0.350–4.500)

## 2013-05-31 NOTE — Progress Notes (Signed)
lmom to call to schedule

## 2013-06-01 NOTE — Progress Notes (Signed)
LMOM TO CALL & SCHEDULE  

## 2013-06-14 ENCOUNTER — Other Ambulatory Visit: Payer: Self-pay | Admitting: *Deleted

## 2013-06-14 MED ORDER — METFORMIN HCL ER 500 MG PO TB24: ORAL_TABLET | ORAL | Status: DC

## 2013-06-29 ENCOUNTER — Encounter: Payer: Self-pay | Admitting: Emergency Medicine

## 2013-06-29 ENCOUNTER — Ambulatory Visit (INDEPENDENT_AMBULATORY_CARE_PROVIDER_SITE_OTHER): Payer: BC Managed Care – PPO | Admitting: Emergency Medicine

## 2013-06-29 VITALS — BP 104/62 | HR 72 | Temp 98.0°F | Resp 18 | Ht 64.5 in | Wt 135.0 lb

## 2013-06-29 DIAGNOSIS — E119 Type 2 diabetes mellitus without complications: Secondary | ICD-10-CM

## 2013-06-29 DIAGNOSIS — E039 Hypothyroidism, unspecified: Secondary | ICD-10-CM

## 2013-06-29 LAB — BASIC METABOLIC PANEL WITH GFR
BUN: 16 mg/dL (ref 6–23)
CO2: 28 mEq/L (ref 19–32)
Calcium: 9.9 mg/dL (ref 8.4–10.5)
Chloride: 103 mEq/L (ref 96–112)
Creat: 0.61 mg/dL (ref 0.50–1.10)
GFR, Est African American: >89 mL/min
GFR, Est Non African American: >89 mL/min
GLUCOSE: 77 mg/dL (ref 70–99)
POTASSIUM: 4.4 meq/L (ref 3.5–5.3)
Sodium: 139 mEq/L (ref 135–145)

## 2013-06-29 NOTE — Progress Notes (Signed)
Subjective:    Patient ID: Mary Frank, female    DOB: 1955/05/10, 58 y.o.   MRN: [id number]  HPI Comments: 58 yo DM and TSH recheck with dose changes. She is taking 1/2 MWF AD, she is feeling much better with dose change and whole rest of the week. She also added Farxiga at last OV. She notes BS 140- 120. She is exercising routinely and eating healthy.      Medication List       This list is accurate as of: 06/29/13 11:59 PM.  Always use your most recent med list.               atorvastatin 80 MG tablet  Commonly known as:  LIPITOR  Take 40 mg by mouth daily.     cholecalciferol 1000 UNITS tablet  Commonly known as:  VITAMIN D  Take 4,000 Units by mouth daily.     Dapagliflozin Propanediol 5 MG Tabs  Commonly known as:  FARXIGA  Take 5 mg by mouth daily.     estradiol 2 MG tablet  Commonly known as:  ESTRACE  Take 2 mg by mouth daily.     ferrous fumarate 325 (106 FE) MG Tabs tablet  Commonly known as:  HEMOCYTE - 106 mg FE  Take 1 tablet by mouth daily.     lisinopril 10 MG tablet  Commonly known as:  PRINIVIL,ZESTRIL  take 1/2 or 1 tablet by mouth once daily     Magnesium 500 MG Caps  Take 500 mg by mouth daily.     medroxyPROGESTERone 5 MG tablet  Commonly known as:  PROVERA  Take 1 tablet 10 days every 3 months     metFORMIN 500 MG 24 hr tablet  Commonly known as:  GLUCOPHAGE-XR  take 1 tablet by mouth WITH BREAKFAST and 1 AT LUNCH and 2 WITH SUPPER     PREMARIN 0.625 MG tablet  Generic drug:  estrogens (conjugated)  0.625 mg daily.       UNCERTAIN IF Levothyroxine is 162mcg or 200 mcg but takes 1/2 MWF 1 rest of week  No Known Allergies  Past Medical History  Diagnosis Date  . Iron deficiency anemia 01/17/2011  . Hypertension   . Hyperlipidemia   . Diabetes mellitus without complication   . Thyroid disease   . Vitamin D deficiency   . Type II or unspecified type diabetes mellitus without mention of complication, not stated as uncontrolled  02/04/2013       Review of Systems  All other systems reviewed and are negative.  BP 104/62  Pulse 72  Temp(Src) 98 F (36.7 C) (Temporal)  Resp 18  Ht 5' 4.5" (1.638 m)  Wt 135 lb (61.236 kg)  BMI 22.82 kg/m2  LMP 06/22/2013     Objective:   Physical Exam  Nursing note and vitals reviewed. Constitutional: She is oriented to person, place, and time. She appears well-developed and well-nourished.  HENT:  Head: Normocephalic and atraumatic.  Right Ear: External ear normal.  Left Ear: External ear normal.  Nose: Nose normal.  Eyes: Conjunctivae and EOM are normal.  Neck: Normal range of motion. No thyromegaly present.  Cardiovascular: Normal rate, regular rhythm, normal heart sounds and intact distal pulses.   Pulmonary/Chest: Effort normal and breath sounds normal.  Musculoskeletal: Normal range of motion.  Lymphadenopathy:    She has no cervical adenopathy.  Neurological: She is alert and oriented to person, place, and time.  Skin: Skin is warm and  dry.  Psychiatric: She has a normal mood and affect. Judgment normal.          Assessment & Plan:  1. DM- Recheck with new start Farxiga 5 mg WE will send in RX/ checklabs  2. Hypothyroid- Recheck with dose change

## 2013-06-29 NOTE — Patient Instructions (Signed)

## 2013-06-30 ENCOUNTER — Encounter: Payer: Self-pay | Admitting: *Deleted

## 2013-06-30 ENCOUNTER — Telehealth: Payer: Self-pay | Admitting: *Deleted

## 2013-06-30 ENCOUNTER — Other Ambulatory Visit: Payer: Self-pay | Admitting: Emergency Medicine

## 2013-06-30 LAB — TSH: TSH: 0.065 u[IU]/mL — ABNORMAL LOW (ref 0.350–4.500)

## 2013-06-30 MED ORDER — LEVOTHYROXINE SODIUM 200 MCG PO TABS: 200.0000 ug | ORAL_TABLET | Freq: Every day | ORAL | Status: DC

## 2013-06-30 MED ORDER — DAPAGLIFLOZIN PROPANEDIOL 5 MG PO TABS: 5.0000 mg | ORAL_TABLET | Freq: Every day | ORAL | Status: DC

## 2013-06-30 NOTE — Telephone Encounter (Signed)
Pt is calling with the dose of leveothyroxine is 258mcg just a fyi

## 2013-07-07 ENCOUNTER — Encounter: Payer: Self-pay | Admitting: Emergency Medicine

## 2013-07-12 ENCOUNTER — Encounter: Payer: Self-pay | Admitting: Internal Medicine

## 2013-07-19 ENCOUNTER — Other Ambulatory Visit: Payer: Self-pay | Admitting: *Deleted

## 2013-07-19 DIAGNOSIS — I83893 Varicose veins of bilateral lower extremities with other complications: Secondary | ICD-10-CM

## 2013-07-29 ENCOUNTER — Other Ambulatory Visit: Payer: Self-pay | Admitting: Emergency Medicine

## 2013-07-29 DIAGNOSIS — E039 Hypothyroidism, unspecified: Secondary | ICD-10-CM

## 2013-07-30 ENCOUNTER — Other Ambulatory Visit (INDEPENDENT_AMBULATORY_CARE_PROVIDER_SITE_OTHER): Payer: BC Managed Care – PPO

## 2013-07-30 DIAGNOSIS — I83893 Varicose veins of bilateral lower extremities with other complications: Secondary | ICD-10-CM

## 2013-07-30 DIAGNOSIS — D509 Iron deficiency anemia, unspecified: Secondary | ICD-10-CM

## 2013-07-30 DIAGNOSIS — E039 Hypothyroidism, unspecified: Secondary | ICD-10-CM

## 2013-07-30 LAB — BASIC METABOLIC PANEL WITH GFR
BUN: 17 mg/dL (ref 6–23)
CHLORIDE: 105 meq/L (ref 96–112)
CO2: 29 mEq/L (ref 19–32)
CREATININE: 0.69 mg/dL (ref 0.50–1.10)
Calcium: 9.8 mg/dL (ref 8.4–10.5)
GFR, Est Non African American: >89 mL/min
Glucose, Bld: 97 mg/dL (ref 70–99)
Potassium: 4.3 mEq/L (ref 3.5–5.3)
Sodium: 139 mEq/L (ref 135–145)

## 2013-07-31 LAB — TSH: TSH: 0.121 u[IU]/mL — AB (ref 0.350–4.500)

## 2013-08-19 ENCOUNTER — Encounter: Payer: Self-pay | Admitting: Emergency Medicine

## 2013-08-19 ENCOUNTER — Ambulatory Visit (INDEPENDENT_AMBULATORY_CARE_PROVIDER_SITE_OTHER): Payer: BC Managed Care – PPO | Admitting: Emergency Medicine

## 2013-08-19 VITALS — BP 100/64 | HR 78 | Temp 98.0°F | Resp 18 | Ht 64.5 in | Wt 131.0 lb

## 2013-08-19 DIAGNOSIS — E119 Type 2 diabetes mellitus without complications: Secondary | ICD-10-CM

## 2013-08-19 DIAGNOSIS — E039 Hypothyroidism, unspecified: Secondary | ICD-10-CM

## 2013-08-19 DIAGNOSIS — I10 Essential (primary) hypertension: Secondary | ICD-10-CM

## 2013-08-19 DIAGNOSIS — E782 Mixed hyperlipidemia: Secondary | ICD-10-CM

## 2013-08-19 LAB — CBC WITH DIFFERENTIAL/PLATELET
BASOS ABS: 0.1 10*3/uL (ref 0.0–0.1)
BASOS PCT: 1 % (ref 0–1)
Eosinophils Absolute: 0.2 10*3/uL (ref 0.0–0.7)
Eosinophils Relative: 3 % (ref 0–5)
HCT: 44 % (ref 36.0–46.0)
Hemoglobin: 15 g/dL (ref 12.0–15.0)
Lymphocytes Relative: 29 % (ref 12–46)
Lymphs Abs: 2.1 10*3/uL (ref 0.7–4.0)
MCH: 28.5 pg (ref 26.0–34.0)
MCHC: 34.1 g/dL (ref 30.0–36.0)
MCV: 83.5 fL (ref 78.0–100.0)
MONO ABS: 0.7 10*3/uL (ref 0.1–1.0)
Monocytes Relative: 9 % (ref 3–12)
NEUTROS ABS: 4.2 10*3/uL (ref 1.7–7.7)
Neutrophils Relative %: 58 % (ref 43–77)
Platelets: 322 10*3/uL (ref 150–400)
RBC: 5.27 MIL/uL — ABNORMAL HIGH (ref 3.87–5.11)
RDW: 12.8 % (ref 11.5–15.5)
WBC: 7.3 10*3/uL (ref 4.0–10.5)

## 2013-08-19 LAB — HEMOGLOBIN A1C
Hgb A1c MFr Bld: 6.9 % — ABNORMAL HIGH (ref <5.7)
Mean Plasma Glucose: 151 mg/dL — ABNORMAL HIGH (ref <117)

## 2013-08-19 NOTE — Progress Notes (Signed)
Subjective:    Patient ID: Mary Frank, female    DOB: Oct 14, 1955, 58 y.o.   MRN: [id number]  HPI Comments: 58 yo WF presents for 3 month F/U for HTN, Cholesterol, DM, D. Deficient. She has been eating better. She notes BS has been better. She is exercising routinely. She notes last week had crampy period and spotting. She did change thyroid dose at last lab to 1/2 daily. She checks feet routinely and denies skin break down or neuropathy increase.  WBC             8.0   05/18/2013 HGB            13.5   05/18/2013 HCT            39.5   05/18/2013 PLT             323   05/18/2013 GLUCOSE          97   07/30/2013 CHOL            171   05/18/2013 TRIG            143   05/18/2013 HDL              62   05/18/2013 LDLCALC          80   05/18/2013 ALT              23   05/18/2013 AST              14   05/18/2013 NA              139   07/30/2013 K               4.3   07/30/2013 CL              105   07/30/2013 CREATININE     0.69   07/30/2013 BUN              17   07/30/2013 CO2              29   07/30/2013 TSH           0.121   07/30/2013 HGBA1C          7.7   05/18/2013 MICROALBUR     0.50   02/04/2013     Medication List       This list is accurate as of: 08/19/13  9:10 AM.  Always use your most recent med list.               atorvastatin 80 MG tablet  Commonly known as:  LIPITOR  Take 40 mg by mouth daily.     cholecalciferol 1000 UNITS tablet  Commonly known as:  VITAMIN D  Take 4,000 Units by mouth daily.     Dapagliflozin Propanediol 5 MG Tabs  Commonly known as:  FARXIGA  Take 5 mg by mouth daily.     estradiol 2 MG tablet  Commonly known as:  ESTRACE  Take 2 mg by mouth daily.     ferrous fumarate 325 (106 FE) MG Tabs tablet  Commonly known as:  HEMOCYTE - 106 mg FE  Take 1 tablet by mouth daily.     levothyroxine 200 MCG tablet  Commonly known as:  SYNTHROID, LEVOTHROID  Take 200 mcg by mouth daily. Takes 1/2 pill = 100 mcg daily     lisinopril 10 MG tablet  Commonly known  as:  PRINIVIL,ZESTRIL  take 1/2 or 1 tablet by mouth once daily     Magnesium 500 MG Caps  Take 500 mg by mouth daily.     medroxyPROGESTERone 5 MG tablet  Commonly known as:  PROVERA  Take 1 tablet 10 days every 3 months     metFORMIN 500 MG 24 hr tablet  Commonly known as:  GLUCOPHAGE-XR  take 1 tablet by mouth WITH BREAKFAST and 1 AT LUNCH and 2 WITH SUPPER     PREMARIN 0.625 MG tablet  Generic drug:  estrogens (conjugated)  0.625 mg daily.       No Known Allergies  Past Medical History  Diagnosis Date  . Iron deficiency anemia 01/17/2011  . Hypertension   . Hyperlipidemia   . Diabetes mellitus without complication   . Thyroid disease   . Vitamin D deficiency   . Type II or unspecified type diabetes mellitus without mention of complication, not stated as uncontrolled 02/04/2013      Review of Systems  Genitourinary: Positive for menstrual problem.  All other systems reviewed and are negative.  BP 100/64  Pulse 78  Temp(Src) 98 F (36.7 C) (Temporal)  Resp 18  Ht 5' 4.5" (1.638 m)  Wt 131 lb (59.421 kg)  BMI 22.15 kg/m2  LMP 08/11/2013     Objective:   Physical Exam  Nursing note and vitals reviewed. Constitutional: She is oriented to person, place, and time. She appears well-developed and well-nourished. No distress.  HENT:  Head: Normocephalic and atraumatic.  Right Ear: External ear normal.  Left Ear: External ear normal.  Nose: Nose normal.  Eyes: Conjunctivae and EOM are normal.  Neck: Normal range of motion. Neck supple. No JVD present. No thyromegaly present.  Cardiovascular: Normal rate, regular rhythm, normal heart sounds and intact distal pulses.   Pulmonary/Chest: Effort normal and breath sounds normal.  Abdominal: Soft. Bowel sounds are normal. She exhibits no distension and no mass. There is no tenderness. There is no rebound and no guarding.  Musculoskeletal: Normal range of motion. She exhibits no edema and no tenderness.   Lymphadenopathy:    She has no cervical adenopathy.  Neurological: She is alert and oriented to person, place, and time. No cranial nerve deficit.  Skin: Skin is warm and dry. No rash noted. No erythema. No pallor.  Mildly dry bilateral feet, mid foot mild callus   Psychiatric: She has a normal mood and affect. Her behavior is normal. Judgment and thought content normal.          Assessment & Plan:  1.  3 month F/U for HTN, Cholesterol,DM, D. Deficient. Needs healthy diet, cardio QD and obtain healthy weight. Check Labs, Check BP if >130/80 call office, Check BS if >200 call office  2. Abnormal cycles- f/u at GYN if reoccurs.  3. Callus/ dry skin DM- Advised needs podiatry evaluation with  DM Hx if no improvement, epsom salt soaks, vaseline treatment QD, monitor QD and call with any concerns/ adverse changes

## 2013-08-19 NOTE — Patient Instructions (Signed)
Diabetes and Foot Care Diabetes may cause you to have problems because of poor blood supply (circulation) to your feet and legs. This may cause the skin on your feet to become thinner, break easier, and heal more slowly. Your skin may become dry, and the skin may peel and crack. You may also have nerve damage in your legs and feet causing decreased feeling in them. You may not notice minor injuries to your feet that could lead to infections or more serious problems. Taking care of your feet is one of the most important things you can do for yourself.  HOME CARE INSTRUCTIONS  Wear shoes at all times, even in the house. Do not go barefoot. Bare feet are easily injured.  Check your feet daily for blisters, cuts, and redness. If you cannot see the bottom of your feet, use a mirror or ask someone for help.  Wash your feet with warm water (do not use hot water) and mild soap. Then pat your feet and the areas between your toes until they are completely dry. Do not soak your feet as this can dry your skin.  Apply a moisturizing lotion or petroleum jelly (that does not contain alcohol and is unscented) to the skin on your feet and to dry, brittle toenails. Do not apply lotion between your toes.  Trim your toenails straight across. Do not dig under them or around the cuticle. File the edges of your nails with an emery board or nail file.  Do not cut corns or calluses or try to remove them with medicine.  Wear clean socks or stockings every day. Make sure they are not too tight. Do not wear knee-high stockings since they may decrease blood flow to your legs.  Wear shoes that fit properly and have enough cushioning. To break in new shoes, wear them for just a few hours a day. This prevents you from injuring your feet. Always look in your shoes before you put them on to be sure there are no objects inside.  Do not cross your legs. This may decrease the blood flow to your feet.  If you find a minor scrape,  cut, or break in the skin on your feet, keep it and the skin around it clean and dry. These areas may be cleansed with mild soap and water. Do not cleanse the area with peroxide, alcohol, or iodine.  When you remove an adhesive bandage, be sure not to damage the skin around it.  If you have a wound, look at it several times a day to make sure it is healing.  Do not use heating pads or hot water bottles. They may burn your skin. If you have lost feeling in your feet or legs, you may not know it is happening until it is too late.  Make sure your health care provider performs a complete foot exam at least annually or more often if you have foot problems. Report any cuts, sores, or bruises to your health care provider immediately. SEEK MEDICAL CARE IF:   You have an injury that is not healing.  You have cuts or breaks in the skin.  You have an ingrown nail.  You notice redness on your legs or feet.  You feel burning or tingling in your legs or feet.  You have pain or cramps in your legs and feet.  Your legs or feet are numb.  Your feet always feel cold. SEEK IMMEDIATE MEDICAL CARE IF:   There is increasing redness,   swelling, or pain in or around a wound.  There is a red line that goes up your leg.  Pus is coming from a wound.  You develop a fever or as directed by your health care provider.  You notice a bad smell coming from an ulcer or wound. Document Released: 02/16/2000 Document Revised: 10/21/2012 Document Reviewed: 07/28/2012 [employer] Patient Information 2015 [employer]. This information is not intended to replace advice given to you by your health care provider. Make sure you discuss any questions you have with your health care provider.  

## 2013-08-20 LAB — BASIC METABOLIC PANEL WITH GFR
BUN: 23 mg/dL (ref 6–23)
CO2: 29 mEq/L (ref 19–32)
Calcium: 10.7 mg/dL — ABNORMAL HIGH (ref 8.4–10.5)
Chloride: 102 mEq/L (ref 96–112)
Creat: 0.75 mg/dL (ref 0.50–1.10)
Glucose, Bld: 100 mg/dL — ABNORMAL HIGH (ref 70–99)
POTASSIUM: 4.5 meq/L (ref 3.5–5.3)
Sodium: 138 mEq/L (ref 135–145)

## 2013-08-20 LAB — TSH: TSH: 0.618 u[IU]/mL (ref 0.350–4.500)

## 2013-08-20 LAB — HEPATIC FUNCTION PANEL
ALT: 17 U/L (ref 0–35)
AST: 14 U/L (ref 0–37)
Albumin: 4.3 g/dL (ref 3.5–5.2)
Alkaline Phosphatase: 71 U/L (ref 39–117)
BILIRUBIN INDIRECT: 0.3 mg/dL (ref 0.2–1.2)
BILIRUBIN TOTAL: 0.4 mg/dL (ref 0.2–1.2)
Bilirubin, Direct: 0.1 mg/dL (ref 0.0–0.3)
Total Protein: 6.9 g/dL (ref 6.0–8.3)

## 2013-08-20 LAB — LIPID PANEL
CHOL/HDL RATIO: 2.3 ratio
CHOLESTEROL: 142 mg/dL (ref 0–200)
HDL: 63 mg/dL (ref >39)
LDL Cholesterol: 66 mg/dL (ref 0–99)
Triglycerides: 65 mg/dL (ref <150)
VLDL: 13 mg/dL (ref 0–40)

## 2013-08-20 LAB — INSULIN, FASTING: Insulin fasting, serum: 5 u[IU]/mL (ref 3–28)

## 2013-09-22 ENCOUNTER — Other Ambulatory Visit: Payer: Self-pay | Admitting: *Deleted

## 2013-09-22 MED ORDER — ATORVASTATIN CALCIUM 80 MG PO TABS: 80.0000 mg | ORAL_TABLET | Freq: Every day | ORAL | Status: DC

## 2013-10-04 ENCOUNTER — Encounter: Payer: Self-pay | Admitting: Vascular Surgery

## 2013-10-05 ENCOUNTER — Ambulatory Visit (HOSPITAL_COMMUNITY)
Admission: RE | Admit: 2013-10-05 | Discharge: 2013-10-05 | Disposition: A | Discharge status: Home / Self Care | Payer: 59 | Source: Ambulatory Visit | Attending: Vascular Surgery | Admitting: Vascular Surgery

## 2013-10-05 ENCOUNTER — Ambulatory Visit (INDEPENDENT_AMBULATORY_CARE_PROVIDER_SITE_OTHER): Payer: 59 | Admitting: Vascular Surgery

## 2013-10-05 ENCOUNTER — Encounter: Payer: Self-pay | Admitting: Vascular Surgery

## 2013-10-05 VITALS — BP 108/68 | HR 68 | Resp 16 | Ht 65.0 in | Wt 133.0 lb

## 2013-10-05 DIAGNOSIS — I83893 Varicose veins of bilateral lower extremities with other complications: Secondary | ICD-10-CM | POA: Diagnosis present

## 2013-10-05 NOTE — Progress Notes (Signed)
Subjective:     Patient ID: Mary Frank, female   DOB: 03-18-1955, 58 y.o.   MRN: [id number]  HPI this 58 year-old female is evaluated for varicose veins in the right leg. She has previously had laser ablation of her left leg performed at [facility] about one year ago. She has bulging painful varicosities in the anterior lateral thigh which increase symptomatically as the day progresses. She has tried long-leg elastic compression stockings in the past with no improvement. She has no history of DVT, thrombophlebitis, bleeding, or ulcers.  Past Medical History  Diagnosis Date  . Iron deficiency anemia 01/17/2011  . Hypertension   . Hyperlipidemia   . Diabetes mellitus without complication   . Thyroid disease   . Vitamin D deficiency   . Type II or unspecified type diabetes mellitus without mention of complication, not stated as uncontrolled 02/04/2013    History  Substance Use Topics  . Smoking status: Former Smoker -- 1.00 packs/day    Quit date: 03/20/2010  . Smokeless tobacco: Not on file  . Alcohol Use: Yes    Family History  Problem Relation Age of Onset  . Diabetes Mother   . Thyroid disease Mother   . Diabetes Father   . Osteoporosis Father   . Thyroid disease Sister     No Known Allergies  Current outpatient prescriptions:atorvastatin (LIPITOR) 80 MG tablet, Take 1 tablet (80 mg total) by mouth daily., Disp: 30 tablet, Rfl: 6;  cholecalciferol (VITAMIN D) 1000 UNITS tablet, Take 4,000 Units by mouth daily., Disp: , Rfl: ;  Dapagliflozin Propanediol (FARXIGA) 5 MG TABS, Take 5 mg by mouth daily., Disp: 30 tablet, Rfl: 6;  estradiol (ESTRACE) 2 MG tablet, Take 2 mg by mouth daily. , Disp: , Rfl:  ferrous fumarate (HEMOCYTE - 106 MG FE) 325 (106 FE) MG TABS, Take 1 tablet by mouth daily., Disp: , Rfl: ;  levothyroxine (SYNTHROID, LEVOTHROID) 200 MCG tablet, Take 200 mcg by mouth daily. Takes 1/2 pill = 100 mcg daily, Disp: , Rfl: ;  lisinopril (PRINIVIL,ZESTRIL) 10 MG tablet,  take 1/2 or 1 tablet by mouth once daily, Disp: 90 tablet, Rfl: 0;  Magnesium 500 MG CAPS, Take 500 mg by mouth daily., Disp: , Rfl:  medroxyPROGESTERone (PROVERA) 5 MG tablet, Take 1 tablet 10 days every 3 months, Disp: , Rfl: ;  metFORMIN (GLUCOPHAGE-XR) 500 MG 24 hr tablet, take 1 tablet by mouth WITH BREAKFAST and 1 AT LUNCH and 2 WITH SUPPER, Disp: 120 tablet, Rfl: 2;  PREMARIN 0.625 MG tablet, 0.625 mg daily. , Disp: , Rfl:   BP 108/68  Pulse 68  Resp 16  Ht 5\' 5"  (1.651 m)  Wt 133 lb (60.328 kg)  BMI 22.13 kg/m2  Body mass index is 22.13 kg/(m^2).          Review of Systems denies chest pain, dyspnea on exertion, PND, orthopnea, hemoptysis. All systems negative complete review of systems    Objective:   Physical Exam BP 108/68  Pulse 68  Resp 16  Ht 5\' 5"  (1.651 m)  Wt 133 lb (60.328 kg)  BMI 22.13 kg/m2  Gen.-alert and oriented x3 in no apparent distress HEENT normal for age Lungs no rhonchi or wheezing Cardiovascular regular rhythm no murmurs carotid pulses 3+ palpable no bruits audible Abdomen soft nontender no palpable masses Musculoskeletal free of  major deformities Skin clear -no rashes Neurologic normal Lower extremities 3+ femoral and dorsalis pedis pulses palpable bilaterally with no edema Right leg has  prominent varicosities beginning in the upper third of the thigh anteriorly extending down lateral to the knee. Also prominent varicosities over the patella. Large nest of reticular and spider veins in the lateral compartment below the knee. No hyperpigmentation or active ulceration noted. Left leg free of bulging varicosities.  Today I ordered a venous duplex exam of the right leg which are reviewed and interpreted. She has gross reflux in the anterior accessory branch of the right great saphenous vein which is clearly supplying these bulging painful varicosities. There is no DVT or deep venous reflux of significance.       Assessment:     Painful  varicosities right leg due to reflux right great saphenous system    Plan:         #1 long leg elastic compression stockings 20-30 mm gradient #2 elevate legs as much as possible #3 ibuprofen daily on a regular basis for pain #4 return in 3 months-if no significant improvement then she will need #1 laser ablation right great saphenous system with greater than 20 stab phlebectomy followed by #22 courses of sclerotherapy Will return in 3 months for further evaluation

## 2013-10-28 ENCOUNTER — Encounter (INDEPENDENT_AMBULATORY_CARE_PROVIDER_SITE_OTHER): Payer: Self-pay

## 2013-11-01 ENCOUNTER — Encounter: Payer: Self-pay | Admitting: Internal Medicine

## 2013-11-01 ENCOUNTER — Other Ambulatory Visit: Payer: Self-pay | Admitting: *Deleted

## 2013-11-01 MED ORDER — METFORMIN HCL ER 500 MG PO TB24: ORAL_TABLET | ORAL | Status: DC

## 2013-11-10 ENCOUNTER — Other Ambulatory Visit: Payer: Self-pay | Admitting: *Deleted

## 2013-11-10 MED ORDER — LISINOPRIL 10 MG PO TABS: ORAL_TABLET | ORAL | Status: DC

## 2013-12-05 ENCOUNTER — Encounter: Payer: Self-pay | Admitting: Internal Medicine

## 2013-12-05 DIAGNOSIS — Z79899 Other long term (current) drug therapy: Secondary | ICD-10-CM | POA: Insufficient documentation

## 2013-12-05 DIAGNOSIS — E559 Vitamin D deficiency, unspecified: Secondary | ICD-10-CM | POA: Insufficient documentation

## 2013-12-05 NOTE — Patient Instructions (Signed)

## 2013-12-05 NOTE — Progress Notes (Signed)
Patient ID: Mary Frank, female   DOB: [birth year], 58 y.o.   MRN: [id number]   This very nice 58 y.o.recently remarried WF presents for 3 month follow up with Hypertension, Hyperlipidemia, Pre-Diabetes and Vitamin D Deficiency. Patient also is Post RAI 131 Tx of Graves' Dz in 1991 and has been adequately compensated.   Patient has hx/o labile HTN predating since 2004 and has been monitored expectantly.& BP has been controlled and today's BP: 118/70 mmHg. Patient has had no complaints of any cardiac type chest pain, palpitations, dyspnea/orthopnea/PND, dizziness, claudication, or dependent edema.   Hyperlipidemia is controlled with diet & meds. Patient denies myalgias or other med SE's. Last Lipids were Total Chol 142; HDL  63; LDL  66; Trig 65 - all at goal on 08/19/2013.   Also, the patient has history of PreDiabetes and has had no symptoms of reactive hypoglycemia, diabetic polys, paresthesias or visual blurring.  Last A1c was  6.9% on 08/19/2013.    Further, the patient also has history of Vitamin D Deficiency (25 in 2008) and supplements vitamin D without any suspected side-effects. Last vitamin D was  82 on 02/04/2013.   Medication List   atorvastatin 80 MG tablet  Commonly known as:  LIPITOR  Take 1 tablet (80 mg total) by mouth daily.     cholecalciferol 1000 UNITS tablet  Commonly known as:  VITAMIN D  Take 4,000 Units by mouth daily.     Dapagliflozin Propanediol 5 MG Tabs  Commonly known as:  FARXIGA  Take 5 mg by mouth daily.     estradiol 2 MG tablet  Commonly known as:  ESTRACE  Take 2 mg by mouth daily.     ferrous fumarate 325 (106 FE) MG Tabs tablet  Commonly known as:  HEMOCYTE - 106 mg FE  Take 1 tablet by mouth daily.     levothyroxine 200 MCG tablet  Commonly known as:  SYNTHROID, LEVOTHROID  Take 200 mcg by mouth daily. Takes 1/2 pill = 100 mcg daily     lisinopril 10 MG tablet  Commonly known as:  PRINIVIL,ZESTRIL  take 1/2 or 1 tablet by mouth once daily      Magnesium 500 MG Caps  Take 500 mg by mouth daily.     medroxyPROGESTERone 5 MG tablet  Commonly known as:  PROVERA  Take 1 tablet 10 days every 3 months     metFORMIN 500 MG 24 hr tablet  Commonly known as:  GLUCOPHAGE-XR  take 1 tablet by mouth WITH BREAKFAST and 1 AT LUNCH and 2 WITH SUPPER     PREMARIN 0.625 MG tablet  Generic drug:  estrogens (conjugated)  0.625 mg daily.     No Known Allergies  PMHx:   Past Medical History  Diagnosis Date  . Iron deficiency anemia 01/17/2011  . Hypertension   . Hyperlipidemia   . Diabetes mellitus without complication   . Thyroid disease   . Vitamin D deficiency   . Type II or unspecified type diabetes mellitus without mention of complication, not stated as uncontrolled 02/04/2013   FHx:    Reviewed / unchanged  SHx:    Reviewed / unchanged  Systems Review:  Constitutional: Denies fever, chills, wt changes, headaches, insomnia, fatigue, night sweats, change in appetite. Eyes: Denies redness, blurred vision, diplopia, discharge, itchy, watery eyes.  ENT: Denies discharge, congestion, post nasal drip, epistaxis, sore throat, earache, hearing loss, dental pain, tinnitus, vertigo, sinus pain, snoring.  CV: Denies chest pain, palpitations, irregular heartbeat,  syncope, dyspnea, diaphoresis, orthopnea, PND, claudication or edema. Respiratory: denies cough, dyspnea, DOE, pleurisy, hoarseness, laryngitis, wheezing.  Gastrointestinal: Denies dysphagia, odynophagia, heartburn, reflux, water brash, abdominal pain or cramps, nausea, vomiting, bloating, diarrhea, constipation, hematemesis, melena, hematochezia  or hemorrhoids. Genitourinary: Denies dysuria, frequency, urgency, nocturia, hesitancy, discharge, hematuria or flank pain. Musculoskeletal: Denies arthralgias, myalgias, stiffness, jt. swelling, pain, limping or strain/sprain.  Skin: Denies pruritus, rash, hives, warts, acne, eczema or change in skin lesion(s). Neuro: No weakness,  tremor, incoordination, spasms, paresthesia or pain. Psychiatric: Denies confusion, memory loss or sensory loss. Endo: Denies change in weight, skin or hair change.  Heme/Lymph: No excessive bleeding, bruising or enlarged lymph nodes.  Exam:  BP 118/70  Pulse 72  Temp(Src) 97.8 F (36.6 C) (Temporal)  Resp 16  Ht 5' 4.5" (1.638 m)  Wt 140 lb 12.8 oz (63.866 kg)  BMI 23.80 kg/m2  Appears well nourished and in no distress. Eyes: PERRLA, EOMs, conjunctiva no swelling or erythema. Sinuses: No frontal/maxillary tenderness ENT/Mouth: EAC's clear, TM's nl w/o erythema, bulging. Nares clear w/o erythema, swelling, exudates. Oropharynx clear without erythema or exudates. Oral hygiene is good. Tongue normal, non obstructing. Hearing intact.  Neck: Supple. Thyroid nl. Car 2+/2+ without bruits, nodes or JVD. Chest: Respirations nl with BS clear & equal w/o rales, rhonchi, wheezing or stridor.  Cor: Heart sounds normal w/ regular rate and rhythm without sig. murmurs, gallops, clicks, or rubs. Peripheral pulses normal and equal  without edema.  Abdomen: Soft & bowel sounds normal. Non-tender w/o guarding, rebound, hernias, masses, or organomegaly.  Lymphatics: Unremarkable.  Musculoskeletal: Full ROM all peripheral extremities, joint stability, 5/5 strength, and normal gait.  Skin: Warm, dry without exposed rashes, lesions or ecchymosis apparent.  Neuro: Cranial nerves intact, reflexes equal bilaterally. Sensory-motor testing grossly intact. Tendon reflexes grossly intact.  Pysch: Alert & oriented x 3.  Insight and judgement nl & appropriate. No ideations.  Assessment and Plan:  1. Hypertension - Continue monitor blood pressure at home. Continue diet/meds same.  2. Hyperlipidemia - Continue diet/meds, exercise,& lifestyle modifications. Continue monitor periodic cholesterol/liver & renal functions   3. Pre-Diabetes - Continue diet, exercise, lifestyle modifications. Monitor appropriate  labs.  4. Vitamin D Deficiency - Continue supplementation.   Recommended regular exercise, BP monitoring, weight control, and discussed med and SE's. Recommended labs to assess and monitor clinical status. Further disposition pending results of labs.

## 2013-12-06 ENCOUNTER — Encounter: Payer: Self-pay | Admitting: Internal Medicine

## 2013-12-06 ENCOUNTER — Ambulatory Visit (INDEPENDENT_AMBULATORY_CARE_PROVIDER_SITE_OTHER): Payer: 59 | Admitting: Internal Medicine

## 2013-12-06 VITALS — BP 118/70 | HR 72 | Temp 97.8°F | Resp 16 | Ht 64.5 in | Wt 140.8 lb

## 2013-12-06 DIAGNOSIS — Z79899 Other long term (current) drug therapy: Secondary | ICD-10-CM

## 2013-12-06 DIAGNOSIS — I10 Essential (primary) hypertension: Secondary | ICD-10-CM

## 2013-12-06 DIAGNOSIS — E782 Mixed hyperlipidemia: Secondary | ICD-10-CM

## 2013-12-06 DIAGNOSIS — E119 Type 2 diabetes mellitus without complications: Secondary | ICD-10-CM

## 2013-12-06 DIAGNOSIS — Z23 Encounter for immunization: Secondary | ICD-10-CM

## 2013-12-06 DIAGNOSIS — E559 Vitamin D deficiency, unspecified: Secondary | ICD-10-CM

## 2013-12-06 MED ORDER — ASPIRIN EC 81 MG PO TBEC: 81.0000 mg | DELAYED_RELEASE_TABLET | Freq: Every day | ORAL | Status: AC

## 2013-12-06 MED ORDER — FISH OIL 1200 MG PO CAPS: 1200.0000 mg | ORAL_CAPSULE | Freq: Two times a day (BID) | ORAL | Status: AC

## 2013-12-07 LAB — CBC WITH DIFFERENTIAL/PLATELET
BASOS ABS: 0.1 10*3/uL (ref 0.0–0.1)
BASOS PCT: 1 % (ref 0–1)
Eosinophils Absolute: 0.3 10*3/uL (ref 0.0–0.7)
Eosinophils Relative: 4 % (ref 0–5)
HEMATOCRIT: 42.9 % (ref 36.0–46.0)
Hemoglobin: 14.2 g/dL (ref 12.0–15.0)
Lymphocytes Relative: 26 % (ref 12–46)
Lymphs Abs: 1.9 10*3/uL (ref 0.7–4.0)
MCH: 28 pg (ref 26.0–34.0)
MCHC: 33.1 g/dL (ref 30.0–36.0)
MCV: 84.6 fL (ref 78.0–100.0)
MONO ABS: 0.4 10*3/uL (ref 0.1–1.0)
Monocytes Relative: 5 % (ref 3–12)
Neutro Abs: 4.7 10*3/uL (ref 1.7–7.7)
Neutrophils Relative %: 64 % (ref 43–77)
Platelets: 312 10*3/uL (ref 150–400)
RBC: 5.07 MIL/uL (ref 3.87–5.11)
RDW: 14 % (ref 11.5–15.5)
WBC: 7.4 10*3/uL (ref 4.0–10.5)

## 2013-12-07 LAB — HEMOGLOBIN A1C
HEMOGLOBIN A1C: 8.3 % — AB (ref <5.7)
MEAN PLASMA GLUCOSE: 192 mg/dL — AB (ref <117)

## 2013-12-07 LAB — HEPATIC FUNCTION PANEL
ALBUMIN: 4.3 g/dL (ref 3.5–5.2)
ALT: 19 U/L (ref 0–35)
AST: 14 U/L (ref 0–37)
Alkaline Phosphatase: 84 U/L (ref 39–117)
Bilirubin, Direct: 0.1 mg/dL (ref 0.0–0.3)
Indirect Bilirubin: 0.2 mg/dL (ref 0.2–1.2)
TOTAL PROTEIN: 6.9 g/dL (ref 6.0–8.3)
Total Bilirubin: 0.3 mg/dL (ref 0.2–1.2)

## 2013-12-07 LAB — INSULIN, FASTING: INSULIN FASTING, SERUM: 3.8 u[IU]/mL (ref 2.0–19.6)

## 2013-12-07 LAB — BASIC METABOLIC PANEL WITH GFR
BUN: 20 mg/dL (ref 6–23)
CHLORIDE: 102 meq/L (ref 96–112)
CO2: 27 mEq/L (ref 19–32)
Calcium: 10 mg/dL (ref 8.4–10.5)
Creat: 0.75 mg/dL (ref 0.50–1.10)
GFR, EST NON AFRICAN AMERICAN: 89 mL/min
Glucose, Bld: 261 mg/dL — ABNORMAL HIGH (ref 70–99)
Potassium: 4.7 mEq/L (ref 3.5–5.3)
Sodium: 139 mEq/L (ref 135–145)

## 2013-12-07 LAB — LIPID PANEL
Cholesterol: 187 mg/dL (ref 0–200)
HDL: 73 mg/dL (ref >39)
LDL CALC: 85 mg/dL (ref 0–99)
TRIGLYCERIDES: 146 mg/dL (ref <150)
Total CHOL/HDL Ratio: 2.6 Ratio
VLDL: 29 mg/dL (ref 0–40)

## 2013-12-07 LAB — VITAMIN D 25 HYDROXY (VIT D DEFICIENCY, FRACTURES): Vit D, 25-Hydroxy: 92 ng/mL — ABNORMAL HIGH (ref 30–89)

## 2013-12-07 LAB — MAGNESIUM: Magnesium: 2.1 mg/dL (ref 1.5–2.5)

## 2013-12-07 LAB — TSH: TSH: 9.461 u[IU]/mL — AB (ref 0.350–4.500)

## 2014-01-03 ENCOUNTER — Other Ambulatory Visit: Payer: Self-pay | Admitting: Nurse Practitioner

## 2014-01-04 ENCOUNTER — Ambulatory Visit: Payer: BC Managed Care – PPO | Admitting: Vascular Surgery

## 2014-01-05 ENCOUNTER — Other Ambulatory Visit: Payer: Self-pay | Admitting: Internal Medicine

## 2014-01-05 DIAGNOSIS — E038 Other specified hypothyroidism: Secondary | ICD-10-CM

## 2014-01-06 ENCOUNTER — Other Ambulatory Visit: Payer: 59

## 2014-01-06 DIAGNOSIS — E038 Other specified hypothyroidism: Secondary | ICD-10-CM

## 2014-01-07 LAB — TSH: TSH: 0.53 u[IU]/mL (ref 0.350–4.500)

## 2014-01-10 ENCOUNTER — Encounter: Payer: Self-pay | Admitting: Vascular Surgery

## 2014-01-11 ENCOUNTER — Ambulatory Visit (INDEPENDENT_AMBULATORY_CARE_PROVIDER_SITE_OTHER): Payer: 59 | Admitting: Vascular Surgery

## 2014-01-11 ENCOUNTER — Encounter: Payer: Self-pay | Admitting: Vascular Surgery

## 2014-01-11 VITALS — BP 103/70 | HR 69 | Ht 64.5 in | Wt 133.7 lb

## 2014-01-11 DIAGNOSIS — I83899 Varicose veins of unspecified lower extremities with other complications: Secondary | ICD-10-CM

## 2014-01-11 HISTORY — DX: Varicose veins of unspecified lower extremity with other complications: I83.899

## 2014-01-11 NOTE — Progress Notes (Signed)
Subjective:     Patient ID: Mary Frank, female   DOB: Jun 17, 1955, 58 y.o.   MRN: [id number]  HPIthis 58 year old female returns for continued follow-up regarding her painful varicosities in the right leg. She has aching throbbing and burning discomfort which worsens the longer she stands. She has tried long-leg elastic compression stockings 20-30 mm gradient as well as elevation ibuprofen with no success. She continues to have symptoms on a daily basis which are affecting her daily living. She has no history of DVT.  Past Medical History  Diagnosis Date  . Iron deficiency anemia 01/17/2011  . Hypertension   . Hyperlipidemia   . Diabetes mellitus without complication   . Thyroid disease   . Vitamin D deficiency   . Type II or unspecified type diabetes mellitus without mention of complication, not stated as uncontrolled 02/04/2013    History  Substance Use Topics  . Smoking status: Former Smoker -- 1.00 packs/day    Quit date: 03/20/2010  . Smokeless tobacco: Not on file  . Alcohol Use: 0.0 oz/week    0 Not specified per week     Comment: ocassional    Family History  Problem Relation Age of Onset  . Diabetes Mother   . Thyroid disease Mother   . Diabetes Father   . Osteoporosis Father   . Thyroid disease Sister     No Known Allergies  Current outpatient prescriptions: aspirin EC 81 MG tablet, Take 1 tablet (81 mg total) by mouth daily., Disp: 150 tablet, Rfl: 2;  atorvastatin (LIPITOR) 80 MG tablet, Take 1 tablet (80 mg total) by mouth daily., Disp: 30 tablet, Rfl: 6;  cholecalciferol (VITAMIN D) 1000 UNITS tablet, Take 4,000 Units by mouth daily., Disp: , Rfl: ;  estradiol (ESTRACE) 2 MG tablet, Take 2 mg by mouth daily. , Disp: , Rfl:  ferrous fumarate (HEMOCYTE - 106 MG FE) 325 (106 FE) MG TABS, Take 1 tablet by mouth daily., Disp: , Rfl: ;  levothyroxine (SYNTHROID, LEVOTHROID) 200 MCG tablet, Take 200 mcg by mouth daily. Takes 1/2 pill = 100 mcg daily, Disp: , Rfl: ;   lisinopril (PRINIVIL,ZESTRIL) 10 MG tablet, take 1/2 or 1 tablet by mouth once daily, Disp: 90 tablet, Rfl: 0;  Magnesium 500 MG CAPS, Take 500 mg by mouth daily., Disp: , Rfl:  medroxyPROGESTERone (PROVERA) 5 MG tablet, Take 1 tablet 10 days every 3 months, Disp: , Rfl: ;  metFORMIN (GLUCOPHAGE-XR) 500 MG 24 hr tablet, take 1 tablet by mouth WITH BREAKFAST and 1 AT LUNCH and 2 WITH SUPPER, Disp: 120 tablet, Rfl: 2;  Omega-3 Fatty Acids (FISH OIL) 1200 MG CAPS, Take 1 capsule (1,200 mg total) by mouth 2 (two) times daily., Disp: , Rfl: ;  PREMARIN 0.625 MG tablet, 0.625 mg daily. , Disp: , Rfl:   BP 103/70 mmHg  Pulse 69  Ht 5' 4.5" (1.638 m)  Wt 133 lb 11.2 oz (60.646 kg)  BMI 22.60 kg/m2  SpO2 97%  Body mass index is 22.6 kg/(m^2).           Review of Systems Denies chest pain, dyspnea on exertion, PND, orthopnea, hemoptysis.     Objective:   Physical Exam BP 103/70 mmHg  Pulse 69  Ht 5' 4.5" (1.638 m)  Wt 133 lb 11.2 oz (60.646 kg)  BMI 22.60 kg/m2  SpO2 97%  Gen. Well-developed well-nourished female no apparent stress alert and oriented 3 Right leg with prominent bulging varicosities beginning at the upper third of the  thigh anteriorly extending into the lateral thigh down into the lateral calf and pretibial region with a large cluster of reticular veins. Also prominent varicosities across patella. No hyperpigmentation or ulceration noted. 3+ dorsalis pedis pulse palpable.     Assessment:     Painful varicosities right leg due to gross reflux anterior accessory great saphenous vein on the right-symptoms are affecting patient's daily living and resistant to conservative measures i.e. Elastic compression stockings 20-30 millimeters gradient long-leg, elevation, and ibuprofen.     Plan:     Patient needs laser ablation anterior accessory right great saphenous vein plus greater than 20 stab phlebectomy to be followed by 2 courses of sclerotherapy Will proceed with  recertification to perform this in the near future

## 2014-01-12 ENCOUNTER — Other Ambulatory Visit: Payer: Self-pay | Admitting: *Deleted

## 2014-01-12 DIAGNOSIS — I83891 Varicose veins of right lower extremities with other complications: Secondary | ICD-10-CM

## 2014-01-21 ENCOUNTER — Encounter: Payer: Self-pay | Admitting: Vascular Surgery

## 2014-01-24 ENCOUNTER — Ambulatory Visit (INDEPENDENT_AMBULATORY_CARE_PROVIDER_SITE_OTHER): Payer: 59 | Admitting: Vascular Surgery

## 2014-01-24 ENCOUNTER — Encounter: Payer: Self-pay | Admitting: Vascular Surgery

## 2014-01-24 VITALS — BP 114/79 | HR 80 | Resp 16 | Ht 64.0 in | Wt 130.0 lb

## 2014-01-24 DIAGNOSIS — I83899 Varicose veins of unspecified lower extremities with other complications: Secondary | ICD-10-CM

## 2014-01-24 NOTE — Progress Notes (Signed)
   Laser Ablation Procedure      Date: 01/24/2014    ANIKO FINNIGAN DOB:1955/04/15  Consent signed: Yes  Surgeon:J.D. Kellie Simmering  Procedure: Laser Ablation: right Greater Saphenous Vein  BP 114/79 mmHg  Pulse 80  Resp 16  Ht 5\' 4"  (1.626 m)  Wt 130 lb (58.968 kg)  BMI 22.30 kg/m2  Start time: 11:10   End time: 12:20  Tumescent Anesthesia: 200 cc 0.9% NaCl with 50 cc Lidocaine HCL with 1% Epi and 15 cc 8.4% NaHCO3  Local Anesthesia: 6 cc Lidocaine HCL and NaHCO3 (ratio 2:1)  Pulsed Mode: 15 watts, 500 ms delay, 1.0 duration Total energy: 789.46, total pulses: 53, total time: :52     Stab Phlebectomy: >20 Sites: Thigh and Calf  Patient tolerated procedure well: Yes  Notes:   Description of Procedure:  After marking the course of the secondary varicosities, the patient was placed on the operating table in the supine position, and the right leg was prepped and draped in sterile fashion.   Local anesthetic was administered and under ultrasound guidance the saphenous vein was accessed with a micro needle and guide wire; then the mirco puncture sheath was place.  A guide wire was inserted saphenofemoral junction , followed by a 5 french sheath.  The position of the sheath and then the laser fiber below the junction was confirmed using the ultrasound.  Tumescent anesthesia was administered along the course of the saphenous vein using ultrasound guidance. The patient was placed in Trendelenburg position and protective laser glasses were placed on patient and staff, and the laser was fired at 15 watt pulsed mode advancing 1-2 mm per sec for a total of 789.46 joules.   For stab phlebectomies, local anesthetic was administered at the previously marked varicosities, and tumescent anesthesia was administered around the vessels.  Greater than 20 stab wounds were made using the tip of an 11 blade. And using the vein hook, the phlebectomies were performed using a hemostat to avulse the varicosities.   Adequate hemostasis was achieved.     Steri strips were applied to the stab wounds and ABD pads and thigh high compression stockings were applied.  Ace wrap bandages were applied over the phlebectomy sites and at the top of the saphenofemoral junction. Blood loss was less than 15 cc.  The patient ambulated out of the operating room having tolerated the procedure well.

## 2014-01-24 NOTE — Progress Notes (Signed)
Subjective:     Patient ID: Mary Frank, female   DOB: 1956/02/11, 58 y.o.   MRN: [id number]  HPI this 58 year old female had laser ablation right great saphenous vein plus multiple stab phlebectomy of painful varicosities performed under local tumescent anesthesia. She tolerated the procedure well. A total of 790 J of energy was utilized. The vein was closed only in the upper third up to near the saphenofemoral junction   Review of Systems     Objective:   Physical Exam BP 114/79 mmHg  Pulse 80  Resp 16  Ht 5\' 4"  (1.626 m)  Wt 130 lb (58.968 kg)  BMI 22.30 kg/m2       Assessment:     Well-tolerated laser ablation right great saphenous vein plus greater than 20 stab phlebectomy of painful varicosities performed under local tumescent anesthesia    Plan:     Return in one week for venous duplex exams confirm closure right great saphenous vein in the upper third right leg

## 2014-01-26 ENCOUNTER — Encounter: Payer: Self-pay | Admitting: Vascular Surgery

## 2014-01-26 ENCOUNTER — Encounter: Payer: Self-pay | Admitting: Surgery

## 2014-01-31 ENCOUNTER — Ambulatory Visit (INDEPENDENT_AMBULATORY_CARE_PROVIDER_SITE_OTHER): Payer: 59 | Admitting: Vascular Surgery

## 2014-01-31 ENCOUNTER — Encounter: Payer: Self-pay | Admitting: Vascular Surgery

## 2014-01-31 ENCOUNTER — Ambulatory Visit (HOSPITAL_COMMUNITY)
Admission: RE | Admit: 2014-01-31 | Discharge: 2014-01-31 | Disposition: A | Discharge status: Home / Self Care | Payer: 59 | Source: Ambulatory Visit | Attending: Vascular Surgery | Admitting: Vascular Surgery

## 2014-01-31 VITALS — BP 116/79 | HR 66 | Resp 14

## 2014-01-31 DIAGNOSIS — I83891 Varicose veins of right lower extremities with other complications: Secondary | ICD-10-CM | POA: Diagnosis present

## 2014-01-31 DIAGNOSIS — I82890 Acute embolism and thrombosis of other specified veins: Secondary | ICD-10-CM | POA: Insufficient documentation

## 2014-01-31 NOTE — Progress Notes (Signed)
Subjective:     Patient ID: Mary Frank, female   DOB: 11-01-1955, 58 y.o.   MRN: [id number]  HPI this 58 year old female had laser ablation of the right great saphenous vein plus greater than 20 stab phlebectomy of painful varicosities performed 1 week ago. She has had no pain at the stab phlebectomy sites. She has had mild to moderate discomfort in the proximal thigh where the ablation was performed. She has had no chest pain or shortness of breath. She denies any distal edema. She has worn her long-leg elastic compression stockings and taken ibuprofen as instructed.   Review of Systems     Objective:   Physical Exam BP 116/79 mmHg  Pulse 66  Resp 14  Gen. well-developed well-nourished female no apparent stress alert and oriented 3 Right leg with moderate ecchymosis in the proximal thigh and nicely healing stab phlebectomy sites along anterior and lateral leg as well as patellar region. No distal edema. 3+ dorsalis pedis pulse palpable.  Today I ordered a venous duplex exam of the right leg which I reviewed and interpreted. There is no DVT. The right great saphenous vein is totally closed up to about 2.2 cm from the saphenofemoral junction. Anterior accessory branch is closed in its mid and distal portion but is compressible proximally.     Assessment:     Successful laser ablation right great saphenous vein plus greater than 20 stab phlebectomy of painful varicosities performed under local tumescent anesthesia Return for sclerotherapy in the near future and to see me on when necessary basis    Plan:

## 2014-02-07 ENCOUNTER — Other Ambulatory Visit: Payer: Self-pay | Admitting: *Deleted

## 2014-02-07 MED ORDER — METFORMIN HCL ER 500 MG PO TB24: ORAL_TABLET | ORAL | Status: DC

## 2014-02-08 ENCOUNTER — Ambulatory Visit (INDEPENDENT_AMBULATORY_CARE_PROVIDER_SITE_OTHER): Payer: 59 | Admitting: Internal Medicine

## 2014-02-08 ENCOUNTER — Encounter: Payer: Self-pay | Admitting: *Deleted

## 2014-02-08 ENCOUNTER — Encounter: Payer: Self-pay | Admitting: Internal Medicine

## 2014-02-08 VITALS — BP 114/70 | HR 72 | Temp 97.7°F | Resp 16 | Ht 64.5 in | Wt 133.2 lb

## 2014-02-08 DIAGNOSIS — I10 Essential (primary) hypertension: Secondary | ICD-10-CM

## 2014-02-08 DIAGNOSIS — Z0001 Encounter for general adult medical examination with abnormal findings: Secondary | ICD-10-CM

## 2014-02-08 DIAGNOSIS — Z23 Encounter for immunization: Secondary | ICD-10-CM

## 2014-02-08 DIAGNOSIS — E119 Type 2 diabetes mellitus without complications: Secondary | ICD-10-CM

## 2014-02-08 DIAGNOSIS — E782 Mixed hyperlipidemia: Secondary | ICD-10-CM

## 2014-02-08 DIAGNOSIS — E559 Vitamin D deficiency, unspecified: Secondary | ICD-10-CM

## 2014-02-08 DIAGNOSIS — Z1212 Encounter for screening for malignant neoplasm of rectum: Secondary | ICD-10-CM

## 2014-02-08 DIAGNOSIS — Z111 Encounter for screening for respiratory tuberculosis: Secondary | ICD-10-CM

## 2014-02-08 DIAGNOSIS — R6889 Other general symptoms and signs: Secondary | ICD-10-CM

## 2014-02-08 DIAGNOSIS — E039 Hypothyroidism, unspecified: Secondary | ICD-10-CM

## 2014-02-08 MED ORDER — DAPAGLIFLOZIN PROPANEDIOL 10 MG PO TABS: ORAL_TABLET | ORAL | Status: DC

## 2014-02-08 NOTE — Patient Instructions (Signed)
Recommend the book "The END of DIETING" by Dr Baker Janus   and the book "The END of DIABETES " by Dr Excell Seltzer  At [email or web address] - get book & Audio CD's      Being diabetic has a  300% increased risk for heart attack, stroke, cancer, and alzheimer- type vascular dementia. It is very important that you work harder with diet by avoiding all foods that are white except chicken & fish. Avoid white rice (brown & wild rice is OK), white potatoes (sweetpotatoes in moderation is OK), White bread or wheat bread or anything made out of white flour like bagels, donuts, rolls, buns, biscuits, cakes, pastries, cookies, pizza crust, and pasta (made from white flour & egg whites) - vegetarian pasta or spinach or wheat pasta is OK. Multigrain breads like Arnold's or [employer], or multigrain sandwich thins or flatbreads.  Diet, exercise and weight loss can reverse and cure diabetes in the early stages.  Diet, exercise and weight loss is very important in the control and prevention of complications of diabetes which affects every system in your body, ie. Brain - dementia/stroke, eyes - glaucoma/blindness, heart - heart attack/heart failure, kidneys - dialysis, stomach - gastric paralysis, intestines - malabsorption, nerves - severe painful neuritis, circulation - gangrene & loss of a leg(s), and finally cancer and Alzheimers.    I recommend avoid fried & greasy foods,  sweets/candy, white rice (brown or wild rice or Quinoa is OK), white potatoes (sweet potatoes are OK) - anything made from white flour - bagels, doughnuts, rolls, buns, biscuits,white and wheat breads, pizza crust and traditional pasta made of white flour & egg white(vegetarian pasta or spinach or wheat pasta is OK).  Multi-grain bread is OK - like multi-grain flat bread or sandwich thins. Avoid alcohol in excess. Exercise is also important.    Eat all the vegetables you want - avoid meat, especially red meat and dairy - especially cheese.  Cheese  is the most concentrated form of trans-fats which is the worst thing to clog up our arteries. Veggie cheese is OK which can be found in the fresh produce section at [employer] or [employer] or [employer]  Preventive Care for Adults A healthy lifestyle and preventive care can promote health and wellness. Preventive health guidelines for women include the following key practices.  A routine yearly physical is a good way to check with your health care provider about your health and preventive screening. It is a chance to share any concerns and updates on your health and to receive a thorough exam.  Visit your dentist for a routine exam and preventive care every 6 months. Brush your teeth twice a day and floss once a day. Good oral hygiene prevents tooth decay and gum disease.  The frequency of eye exams is based on your age, health, family medical history, use of contact lenses, and other factors. Follow your health care provider's recommendations for frequency of eye exams.  Eat a healthy diet. Foods like vegetables, fruits, whole grains, low-fat dairy products, and lean protein foods contain the nutrients you need without too many calories. Decrease your intake of foods high in solid fats, added sugars, and salt. Eat the right amount of calories for you.Get information about a proper diet from your health care provider, if necessary.  Regular physical exercise is one of the most important things you can do for your health. Most adults should get at least 150 minutes of moderate-intensity exercise (any activity that increases  your heart rate and causes you to sweat) each week. In addition, most adults need muscle-strengthening exercises on 2 or more days a week.  Maintain a healthy weight. The body mass index (BMI) is a screening tool to identify possible weight problems. It provides an estimate of body fat based on height and weight. Your health care provider can find your BMI and can help you  achieve or maintain a healthy weight.For adults 20 years and older:  A BMI below 18.5 is considered underweight.  A BMI of 18.5 to 24.9 is normal.  A BMI of 25 to 29.9 is considered overweight.  A BMI of 30 and above is considered obese.  Maintain normal blood lipids and cholesterol levels by exercising and minimizing your intake of saturated fat. Eat a balanced diet with plenty of fruit and vegetables. Blood tests for lipids and cholesterol should begin at age 38 and be repeated every 5 years. If your lipid or cholesterol levels are high, you are over 50, or you are at high risk for heart disease, you may need your cholesterol levels checked more frequently.Ongoing high lipid and cholesterol levels should be treated with medicines if diet and exercise are not working.  If you smoke, find out from your health care provider how to quit. If you do not use tobacco, do not start.  Lung cancer screening is recommended for adults aged 64-80 years who are at high risk for developing lung cancer because of a history of smoking. A yearly low-dose CT scan of the lungs is recommended for people who have at least a 30-pack-year history of smoking and are a current smoker or have quit within the past 15 years. A pack year of smoking is smoking an average of 1 pack of cigarettes a day for 1 year (for example: 1 pack a day for 30 years or 2 packs a day for 15 years). Yearly screening should continue until the smoker has stopped smoking for at least 15 years. Yearly screening should be stopped for people who develop a health problem that would prevent them from having lung cancer treatment.  High blood pressure causes heart disease and increases the risk of stroke. Your blood pressure should be checked at least every 1 to 2 years. Ongoing high blood pressure should be treated with medicines if weight loss and exercise do not work.  If you are 79-57 years old, ask your health care provider if you should take  aspirin to prevent strokes.  Diabetes screening involves taking a blood sample to check your fasting blood sugar level. This should be done once every 3 years, after age 37, if you are within normal weight and without risk factors for diabetes. Testing should be considered at a younger age or be carried out more frequently if you are overweight and have at least 1 risk factor for diabetes.  Breast cancer screening is essential preventive care for women. You should practice "breast self-awareness." This means understanding the normal appearance and feel of your breasts and may include breast self-examination. Any changes detected, no matter how small, should be reported to a health care provider. Women in their 76s and 30s should have a clinical breast exam (CBE) by a health care provider as part of a regular health exam every 1 to 3 years. After age 55, women should have a CBE every year. Starting at age 79, women should consider having a mammogram (breast X-ray test) every year. Women who have a family history of breast  cancer should talk to their health care provider about genetic screening. Women at a high risk of breast cancer should talk to their health care providers about having an MRI and a mammogram every year.  Breast cancer gene (BRCA)-related cancer risk assessment is recommended for women who have family members with BRCA-related cancers. BRCA-related cancers include breast, ovarian, tubal, and peritoneal cancers. Having family members with these cancers may be associated with an increased risk for harmful changes (mutations) in the breast cancer genes BRCA1 and BRCA2. Results of the assessment will determine the need for genetic counseling and BRCA1 and BRCA2 testing.  Routine pelvic exams to screen for cancer are no longer recommended for nonpregnant women who are considered low risk for cancer of the pelvic organs (ovaries, uterus, and vagina) and who do not have symptoms. Ask your health  care provider if a screening pelvic exam is right for you.  If you have had past treatment for cervical cancer or a condition that could lead to cancer, you need Pap tests and screening for cancer for at least 20 years after your treatment. If Pap tests have been discontinued, your risk factors (such as having a new sexual partner) need to be reassessed to determine if screening should be resumed. Some women have medical problems that increase the chance of getting cervical cancer. In these cases, your health care provider may recommend more frequent screening and Pap tests.  Colorectal cancer can be detected and often prevented. Most routine colorectal cancer screening begins at the age of 58 years and continues through age 44 years. However, your health care provider may recommend screening at an earlier age if you have risk factors for colon cancer. On a yearly basis, your health care provider may provide home test kits to check for hidden blood in the stool. Use of a small camera at the end of a tube, to directly examine the colon (sigmoidoscopy or colonoscopy), can detect the earliest forms of colorectal cancer. Talk to your health care provider about this at age 84, when routine screening begins. Direct exam of the colon should be repeated every 5-10 years through age 33 years, unless early forms of pre-cancerous polyps or small growths are found.  Hepatitis C blood testing is recommended for all people born from [birth year] through 1965 and any individual with known risks for hepatitis C.  Pra  Osteoporosis is a disease in which the bones lose minerals and strength with aging. This can result in serious bone fractures or breaks. The risk of osteoporosis can be identified using a bone density scan. Women ages 36 years and over and women at risk for fractures or osteoporosis should discuss screening with their health care providers. Ask your health care provider whether you should take a calcium supplement  or vitamin D to reduce the rate of osteoporosis.  Menopause can be associated with physical symptoms and risks. Hormone replacement therapy is available to decrease symptoms and risks. You should talk to your health care provider about whether hormone replacement therapy is right for you.  Use sunscreen. Apply sunscreen liberally and repeatedly throughout the day. You should seek shade when your shadow is shorter than you. Protect yourself by wearing long sleeves, pants, a wide-brimmed hat, and sunglasses year round, whenever you are outdoors.  Once a month, do a whole body skin exam, using a mirror to look at the skin on your back. Tell your health care provider of new moles, moles that have irregular borders, moles that are  larger than a pencil eraser, or moles that have changed in shape or color.  Stay current with required vaccines (immunizations).  Influenza vaccine. All adults should be immunized every year.  Tetanus, diphtheria, and acellular pertussis (Td, Tdap) vaccine. Pregnant women should receive 1 dose of Tdap vaccine during each pregnancy. The dose should be obtained regardless of the length of time since the last dose. Immunization is preferred during the 27th-36th week of gestation. An adult who has not previously received Tdap or who does not know her vaccine status should receive 1 dose of Tdap. This initial dose should be followed by tetanus and diphtheria toxoids (Td) booster doses every 10 years. Adults with an unknown or incomplete history of completing a 3-dose immunization series with Td-containing vaccines should begin or complete a primary immunization series including a Tdap dose. Adults should receive a Td booster every 10 years.  Varicella vaccine. An adult without evidence of immunity to varicella should receive 2 doses or a second dose if she has previously received 1 dose. Pregnant females who do not have evidence of immunity should receive the first dose after  pregnancy. This first dose should be obtained before leaving the health care facility. The second dose should be obtained 4-8 weeks after the first dose.  Human papillomavirus (HPV) vaccine. Females aged 13-26 years who have not received the vaccine previously should obtain the 3-dose series. The vaccine is not recommended for use in pregnant females. However, pregnancy testing is not needed before receiving a dose. If a female is found to be pregnant after receiving a dose, no treatment is needed. In that case, the remaining doses should be delayed until after the pregnancy. Immunization is recommended for any person with an immunocompromised condition through the age of 26 years if she did not get any or all doses earlier. During the 3-dose series, the second dose should be obtained 4-8 weeks after the first dose. The third dose should be obtained 24 weeks after the first dose and 16 weeks after the second dose.  Zoster vaccine. One dose is recommended for adults aged 60 years or older unless certain conditions are present.  Measles, mumps, and rubella (MMR) vaccine. Adults born before 1957 generally are considered immune to measles and mumps. Adults born in 1957 or later should have 1 or more doses of MMR vaccine unless there is a contraindication to the vaccine or there is laboratory evidence of immunity to each of the three diseases. A routine second dose of MMR vaccine should be obtained at least 28 days after the first dose for students attending postsecondary schools, health care workers, or international travelers. People who received inactivated measles vaccine or an unknown type of measles vaccine during 1963-1967 should receive 2 doses of MMR vaccine. People who received inactivated mumps vaccine or an unknown type of mumps vaccine before 1979 and are at high risk for mumps infection should consider immunization with 2 doses of MMR vaccine. For females of childbearing age, rubella immunity should  be determined. If there is no evidence of immunity, females who are not pregnant should be vaccinated. If there is no evidence of immunity, females who are pregnant should delay immunization until after pregnancy. Unvaccinated health care workers born before 1957 who lack laboratory evidence of measles, mumps, or rubella immunity or laboratory confirmation of disease should consider measles and mumps immunization with 2 doses of MMR vaccine or rubella immunization with 1 dose of MMR vaccine.  Pneumococcal 13-valent conjugate (PCV13) vaccine. When   indicated, a person who is uncertain of her immunization history and has no record of immunization should receive the PCV13 vaccine. An adult aged 73 years or older who has certain medical conditions and has not been previously immunized should receive 1 dose of PCV13 vaccine. This PCV13 should be followed with a dose of pneumococcal polysaccharide (PPSV23) vaccine. The PPSV23 vaccine dose should be obtained at least 8 weeks after the dose of PCV13 vaccine. An adult aged 81 years or older who has certain medical conditions and previously received 1 or more doses of PPSV23 vaccine should receive 1 dose of PCV13. The PCV13 vaccine dose should be obtained 1 or more years after the last PPSV23 vaccine dose.    Pneumococcal polysaccharide (PPSV23) vaccine. When PCV13 is also indicated, PCV13 should be obtained first. All adults aged 69 years and older should be immunized. An adult younger than age 35 years who has certain medical conditions should be immunized. Any person who resides in a nursing home or long-term care facility should be immunized. An adult smoker should be immunized. People with an immunocompromised condition and certain other conditions should receive both PCV13 and PPSV23 vaccines. People with human immunodeficiency virus (HIV) infection should be immunized as soon as possible after diagnosis. Immunization during chemotherapy or radiation therapy should  be avoided. Routine use of PPSV23 vaccine is not recommended for American Indians, [place]atives, or people younger than 65 years unless there are medical conditions that require PPSV23 vaccine. When indicated, people who have unknown immunization and have no record of immunization should receive PPSV23 vaccine. One-time revaccination 5 years after the first dose of PPSV23 is recommended for people aged 19-64 years who have chronic kidney failure, nephrotic syndrome, asplenia, or immunocompromised conditions. People who received 1-2 doses of PPSV23 before age 79 years should receive another dose of PPSV23 vaccine at age 73 years or later if at least 5 years have passed since the previous dose. Doses of PPSV23 are not needed for people immunized with PPSV23 at or after age 19 years.  Preventive Services / Frequency   Ages 25 to 65 years  Blood pressure check.  Lipid and cholesterol check.  Lung cancer screening. / Every year if you are aged 8-80 years and have a 30-pack-year history of smoking and currently smoke or have quit within the past 15 years. Yearly screening is stopped once you have quit smoking for at least 15 years or develop a health problem that would prevent you from having lung cancer treatment.  Clinical breast exam.** / Every year after age 28 years.  BRCA-related cancer risk assessment.** / For women who have family members with a BRCA-related cancer (breast, ovarian, tubal, or peritoneal cancers).  Mammogram.** / Every year beginning at age 57 years and continuing for as long as you are in good health. Consult with your health care provider.  Pap test.** / Every 3 years starting at age 57 years through age 25 or 13 years with a history of 3 consecutive normal Pap tests.  HPV screening.** / Every 3 years from ages 21 years through ages 60 to 48 years with a history of 3 consecutive normal Pap tests.  Fecal occult blood test (FOBT) of stool. / Every year beginning at age 77  years and continuing until age 57 years. You may not need to do this test if you get a colonoscopy every 10 years.  Flexible sigmoidoscopy or colonoscopy.** / Every 5 years for a flexible sigmoidoscopy or every 10 years  for a colonoscopy beginning at age 104 years and continuing until age 39 years.  Hepatitis C blood test.** / For all people born from [birth year] through 1965 and any individual with known risks for hepatitis C.  Skin self-exam. / Monthly.  Influenza vaccine. / Every year.  Tetanus, diphtheria, and acellular pertussis (Tdap/Td) vaccine.** / Consult your health care provider. Pregnant women should receive 1 dose of Tdap vaccine during each pregnancy. 1 dose of Td every 10 years.  Varicella vaccine.** / Consult your health care provider. Pregnant females who do not have evidence of immunity should receive the first dose after pregnancy.  Zoster vaccine.** / 1 dose for adults aged 47 years or older.  Pneumococcal 13-valent conjugate (PCV13) vaccine.** / Consult your health care provider.  Pneumococcal polysaccharide (PPSV23) vaccine.** / 1 to 2 doses if you smoke cigarettes or if you have certain conditions.  Meningococcal vaccine.** / Consult your health care provider.  Hepatitis A vaccine.** / Consult your health care provider.  Hepatitis B vaccine.** / Consult your health care provider. Screening for abdominal aortic aneurysm (AAA)  by ultrasound is recommended for people over 50 who have history of high blood pressure or who are current or former smokers.

## 2014-02-08 NOTE — Progress Notes (Signed)
Patient ID: Mary Frank, female   DOB: 07/14/1955, 58 y.o.   MRN: [id number]  Annual Screening Comprehensive Examination  This very nice 58 y.o.SWF presents for complete physical.  Patient has been followed for HTN, T2_NIDDM, Hyperlipidemia, and Vitamin D Deficiency. Patient has post RAI-131 Tx of Grave's Dz in 1991.     HTN predates since 2004. Patient's BP has been controlled at home and patient denies any cardiac symptoms as chest pain, palpitations, shortness of breath, dizziness or ankle swelling. Today's BP: 114/70 mmHg    Patient's hyperlipidemia is controlled with diet and medications. Patient denies myalgias or other medication SE's. Last lipids were Total Cholesterol,  187; HDL  73; LDL 85; [id number] on 12/06/2013.   Patient has T2_NIDDM  predating since  2003 and monitored until Feb 2014 when Treatment started with Metformin  and patient denies reactive hypoglycemic symptoms, visual blurring, diabetic polys, or paresthesias. Last A1c was not at goal with A1c 8.3% on 12/06/2013.   Finally, patient has history of Vitamin D Deficiency of 25 in 2008  and last Vitamin D was  92 on 12/06/2013.  Medication Sig  . aspirin EC 81 MG tab Take 1 tablet (81 mg total) by mouth daily.  Marland Kitchen atorvastatin  80 MG tab Take 1 tablet (80 mg total) by mouth daily.  Marland Kitchen VITAMIN D  Take 4,000 Units by mouth daily.  Marland Kitchen estradiol (ESTRACE) 2 MG tab Take 2 mg by mouth daily.   . ferrous fumarate - 106 MG FE / 325  Take 1 tablet by mouth daily.  Marland Kitchen levothyroxine  200 MCG tab Takes 1 tab on M,W,F and 1/2 tab 4 days of the week  . lisinopril  10 MG tabl take 1/2 or 1 tablet by mouth once daily  . Magnesium 500 MG CAP Take 500 mg by mouth daily.  . medroxyPROGESTERone 5 MG tab Take 1 tablet 10 days every 3 months  . metFORMIN -XR 500 MG 24 take 1 tablet by mouth WITH BREAKFAST and 1 AT LUNCH and 2 WITH SUPPER  . FISH OIL 1200 MG Take 1 capsule (1,200 mg total) by mouth 2 (two) times daily.  Marland Kitchen PREMARIN 0.625 MG tab 0.625 mg  daily.    No Known Allergies   Past Medical History  Diagnosis Date  . Iron deficiency anemia 01/17/2011  . Hypertension   . Hyperlipidemia   . Diabetes mellitus without complication   . Thyroid disease   . Vitamin D deficiency   . Type II or unspecified type diabetes mellitus without mention of complication, not stated as uncontrolled 02/04/2013  . Varicose veins    Health Maintenance  Topic Date Due  . PNEUMOCOCCAL POLYSACCHARIDE VACCINE (1) 09/25/1959  . OPHTHALMOLOGY EXAM  09/25/1967  . MAMMOGRAM  01/15/2013  . PAP SMEAR  01/02/2014  . URINE MICROALBUMIN  02/04/2014  . FOOT EXAM  05/19/2014  . HEMOGLOBIN A1C  06/07/2014  . INFLUENZA VACCINE  10/03/2014  . TETANUS/TDAP  03/04/2016  . COLONOSCOPY  12/14/2018   Immunization History  Administered Date(s) Administered  . Influenza Split 12/06/2013  . Influenza Whole 01/28/2012  . Tdap 03/04/2006   Past Surgical History  Procedure Laterality Date  . Vein ligation and stripping Left    Family History  Problem Relation Age of Onset  . Diabetes Mother   . Thyroid disease Mother   . Diabetes Father   . Osteoporosis Father   . Thyroid disease Sister    History  Substance Use Topics  .  Smoking status: Former Smoker -- 1.00 packs/day    Quit date: 03/20/2010  . Smokeless tobacco: Never Used  . Alcohol Use: 0.0 oz/week    0 Not specified per week     Comment: ocassional   ROS  Constitutional: Denies fever, chills, weight loss/gain, headaches, insomnia, fatigue, night sweats, and change in appetite. Eyes: Denies redness, blurred vision, diplopia, discharge, itchy, watery eyes.  ENT: Denies discharge, congestion, post nasal drip, epistaxis, sore throat, earache, hearing loss, dental pain, Tinnitus, Vertigo, Sinus pain, snoring.  Cardio: Denies chest pain, palpitations, irregular heartbeat, syncope, dyspnea, diaphoresis, orthopnea, PND, claudication, edema Respiratory: denies cough, dyspnea, DOE, pleurisy, hoarseness,  laryngitis, wheezing.  Gastrointestinal: Denies dysphagia, heartburn, reflux, water brash, pain, cramps, nausea, vomiting, bloating, diarrhea, constipation, hematemesis, melena, hematochezia, jaundice, hemorrhoids Genitourinary: Denies dysuria, frequency, urgency, nocturia, hesitancy, discharge, hematuria, flank pain Breast: Breast lumps, nipple discharge, bleeding.  Musculoskeletal: Denies arthralgia, myalgia, stiffness, Jt. Swelling, pain, limp, and strain/sprain. Denies falls. Skin: Denies puritis, rash, hives, warts, acne, eczema, changing in skin lesion Neuro: No weakness, tremor, incoordination, spasms, paresthesia, pain Psychiatric: Denies confusion, memory loss, sensory loss. Denies Depression. Endocrine: Denies change in weight, skin, hair change, nocturia, and paresthesia, diabetic polys, visual blurring, hyper / hypo glycemic episodes.  Heme/Lymph: No excessive bleeding, bruising, enlarged lymph nodes.  Physical Exam  BP 114/70   Pulse 72  Temp 97.7 F   Resp 16  Ht 5' 4.5"   Wt 133 lb 3.2 oz   BMI 22.52   General Appearance: Well nourished and in no apparent distress. Eyes: PERRLA, EOMs, conjunctiva no swelling or erythema, normal fundi and vessels. Sinuses: No frontal/maxillary tenderness ENT/Mouth: EACs patent / TMs  nl. Nares clear without erythema, swelling, mucoid exudates. Oral hygiene is good. No erythema, swelling, or exudate. Tongue normal, non-obstructing. Tonsils not swollen or erythematous. Hearing normal.  Neck: Supple, thyroid normal. No bruits, nodes or JVD. Respiratory: Respiratory effort normal.  BS equal and clear bilateral without rales, rhonci, wheezing or stridor. Cardio: Heart sounds are normal with regular rate and rhythm and no murmurs, rubs or gallops. Peripheral pulses are normal and equal bilaterally without edema. No aortic or femoral bruits. Chest: symmetric with normal excursions and percussion. Breasts: Symmetric, without lumps, nipple  discharge, retractions, or fibrocystic changes.  Abdomen: Flat, soft, with bowl sounds. Nontender, no guarding, rebound, hernias, masses, or organomegaly.  Lymphatics: Non tender without lymphadenopathy.  Genitourinary:  Musculoskeletal: Full ROM all peripheral extremities, joint stability, 5/5 strength, and normal gait. Skin: Warm and dry without rashes, lesions, cyanosis, clubbing or  ecchymosis.  Neuro: Cranial nerves intact, reflexes equal bilaterally. Normal muscle tone, no cerebellar symptoms. Sensation intact to the toes by Monofilament testing.  Pysch: Awake and oriented X 3, normal affect, Insight and Judgment appropriate.   Assessment and Plan  1. Encounter for general adult medical examination with abnormal findings  - Urine Microscopic - Vitamin B12 - Iron and TIBC - CBC with Differential - BASIC METABOLIC PANEL WITH GFR - Hepatic function panel - Magnesium - Lipid panel - TSH - Hemoglobin A1c - Insulin, fasting  2. Essential hypertension  - EKG 12-Lead - [place], RETROPERITNL ABD,  LTD  3. Mixed hyperlipidemia   4. T2_NIDDM  - Microalbumin / creatinine urine ratio - HM DIABETES FOOT EXAM - LOW EXTREMITY NEUR EXAM DOCUM - dapagliflozin propanediol (FARXIGA) 10 MG TABS tablet; Take 1/2 to 1 tablet daily for diabetes  Dispense: 30 tablet; Refill: 99  5. Hypothyroidism   6. Vitamin D deficiency  -  Vit D  25 hydroxy (rtn osteoporosis monitoring)  7. Screening for rectal cancer  - POC Hemoccult Bld/Stl (3-Cd Home Screen); Future  8. Need for prophylactic vaccination and inoculation against influenza  - PPD  9. Screening examination for pulmonary tuberculosis   Continue prudent diet as discussed, weight control, BP monitoring, regular exercise, and medications. Discussed med's effects and SE's. Screening labs and tests as requested with regular follow-up as recommended.

## 2014-02-09 ENCOUNTER — Ambulatory Visit (INDEPENDENT_AMBULATORY_CARE_PROVIDER_SITE_OTHER): Payer: 59 | Admitting: *Deleted

## 2014-02-09 DIAGNOSIS — I83891 Varicose veins of right lower extremities with other complications: Secondary | ICD-10-CM

## 2014-02-09 LAB — VITAMIN B12: Vitamin B-12: 565 pg/mL (ref 211–911)

## 2014-02-09 LAB — HEMOGLOBIN A1C
Hgb A1c MFr Bld: 7.2 % — ABNORMAL HIGH (ref <5.7)
Mean Plasma Glucose: 160 mg/dL — ABNORMAL HIGH (ref <117)

## 2014-02-09 LAB — CBC WITH DIFFERENTIAL/PLATELET
Basophils Absolute: 0.1 10*3/uL (ref 0.0–0.1)
Basophils Relative: 1 % (ref 0–1)
EOS PCT: 2 % (ref 0–5)
Eosinophils Absolute: 0.2 10*3/uL (ref 0.0–0.7)
HCT: 41.5 % (ref 36.0–46.0)
HEMOGLOBIN: 13.3 g/dL (ref 12.0–15.0)
LYMPHS ABS: 1.9 10*3/uL (ref 0.7–4.0)
LYMPHS PCT: 23 % (ref 12–46)
MCH: 27.8 pg (ref 26.0–34.0)
MCHC: 32 g/dL (ref 30.0–36.0)
MCV: 86.6 fL (ref 78.0–100.0)
MPV: 10.7 fL (ref 9.4–12.4)
Monocytes Absolute: 0.4 10*3/uL (ref 0.1–1.0)
Monocytes Relative: 5 % (ref 3–12)
NEUTROS PCT: 69 % (ref 43–77)
Neutro Abs: 5.6 10*3/uL (ref 1.7–7.7)
PLATELETS: 326 10*3/uL (ref 150–400)
RBC: 4.79 MIL/uL (ref 3.87–5.11)
RDW: 12.3 % (ref 11.5–15.5)
WBC: 8.1 10*3/uL (ref 4.0–10.5)

## 2014-02-09 LAB — HEPATIC FUNCTION PANEL
ALBUMIN: 3.9 g/dL (ref 3.5–5.2)
ALK PHOS: 56 U/L (ref 39–117)
ALT: 16 U/L (ref 0–35)
AST: 13 U/L (ref 0–37)
BILIRUBIN TOTAL: 0.2 mg/dL (ref 0.2–1.2)
Total Protein: 5.8 g/dL — ABNORMAL LOW (ref 6.0–8.3)

## 2014-02-09 LAB — URINALYSIS, MICROSCOPIC ONLY
BACTERIA UA: NONE SEEN
Casts: NONE SEEN
SQUAMOUS EPITHELIAL / LPF: NONE SEEN

## 2014-02-09 LAB — IRON AND TIBC
%SAT: 20 % (ref 20–55)
Iron: 67 ug/dL (ref 42–145)
TIBC: 332 ug/dL (ref 250–470)
UIBC: 265 ug/dL (ref 125–400)

## 2014-02-09 LAB — MAGNESIUM: MAGNESIUM: 1.9 mg/dL (ref 1.5–2.5)

## 2014-02-09 LAB — BASIC METABOLIC PANEL WITH GFR
BUN: 25 mg/dL — ABNORMAL HIGH (ref 6–23)
CALCIUM: 9.4 mg/dL (ref 8.4–10.5)
CHLORIDE: 105 meq/L (ref 96–112)
CO2: 26 meq/L (ref 19–32)
Creat: 0.75 mg/dL (ref 0.50–1.10)
GFR, Est African American: >89 mL/min
GFR, Est Non African American: 89 mL/min
Glucose, Bld: 220 mg/dL — ABNORMAL HIGH (ref 70–99)
Potassium: 4.4 mEq/L (ref 3.5–5.3)
SODIUM: 141 meq/L (ref 135–145)

## 2014-02-09 LAB — MICROALBUMIN / CREATININE URINE RATIO
Creatinine, Urine: 134 mg/dL
Microalb Creat Ratio: 3.7 mg/g (ref 0.0–30.0)
Microalb, Ur: 0.5 mg/dL (ref <2.0)

## 2014-02-09 LAB — LIPID PANEL
Cholesterol: 129 mg/dL (ref 0–200)
HDL: 53 mg/dL (ref >39)
LDL Cholesterol: 48 mg/dL (ref 0–99)
Total CHOL/HDL Ratio: 2.4 Ratio
Triglycerides: 142 mg/dL (ref <150)
VLDL: 28 mg/dL (ref 0–40)

## 2014-02-09 LAB — TSH: TSH: 0.299 u[IU]/mL — ABNORMAL LOW (ref 0.350–4.500)

## 2014-02-09 LAB — INSULIN, FASTING: INSULIN FASTING, SERUM: 11.7 u[IU]/mL (ref 2.0–19.6)

## 2014-02-09 LAB — VITAMIN D 25 HYDROXY (VIT D DEFICIENCY, FRACTURES): VIT D 25 HYDROXY: 64 ng/mL (ref 30–100)

## 2014-02-09 NOTE — Progress Notes (Signed)
X=.3% Sotradecol administered with a 27g butterfly.  Patient received a total of 18cc.  Treated everything I saw on the right leg. She still has some bruising from the laser ablation and stab phlebs so will need to use her 2nd approved sclero session. Treated a combo of reticulars and spiders. Tol well. Will follow prn.  Photos: No.  Compression stockings applied: Yes.

## 2014-02-10 ENCOUNTER — Encounter: Payer: Self-pay | Admitting: *Deleted

## 2014-02-11 LAB — TB SKIN TEST
INDURATION: 0 mm
TB Skin Test: NEGATIVE

## 2014-02-14 ENCOUNTER — Telehealth: Payer: Self-pay

## 2014-02-14 NOTE — Telephone Encounter (Signed)
Left message for patient to return call for lab results. 

## 2014-02-14 NOTE — Telephone Encounter (Signed)
-----   Message from Unk Pinto, MD sent at 02/14/2014  8:15 AM EST ----- - B12 - Iron - CBC - Kidneys - Liver - Mag - all Ok - Chol 142 - great - Thyroid high - decrease to 1/2 tab 6 x /week & 1 tab Sat  - A1c better from 8.3% down to 7.2%  - but still too high - continue Metformin same  - 4 tabs/day and suggest add Cinnamon 1,000 mg tab - 2 x day (can get at [email or web address])  - Cinnamon increases insulin sensitivity and helps lower blood sugar -  Vit D 64 - great  - All else Ok - Suggest Lab in 1 month to recheck TSH

## 2014-03-14 ENCOUNTER — Other Ambulatory Visit: Payer: Self-pay | Admitting: Internal Medicine

## 2014-03-14 DIAGNOSIS — E039 Hypothyroidism, unspecified: Secondary | ICD-10-CM

## 2014-03-15 ENCOUNTER — Other Ambulatory Visit: Payer: 59

## 2014-03-15 DIAGNOSIS — E039 Hypothyroidism, unspecified: Secondary | ICD-10-CM

## 2014-03-15 LAB — TSH: TSH: 0.688 u[IU]/mL (ref 0.350–4.500)

## 2014-03-22 ENCOUNTER — Encounter: Payer: Self-pay | Admitting: *Deleted

## 2014-03-23 ENCOUNTER — Other Ambulatory Visit: Payer: Self-pay | Admitting: Emergency Medicine

## 2014-03-23 ENCOUNTER — Ambulatory Visit (INDEPENDENT_AMBULATORY_CARE_PROVIDER_SITE_OTHER): Payer: 59 | Admitting: *Deleted

## 2014-03-23 DIAGNOSIS — I83891 Varicose veins of right lower extremities with other complications: Secondary | ICD-10-CM

## 2014-03-23 DIAGNOSIS — I83899 Varicose veins of unspecified lower extremities with other complications: Secondary | ICD-10-CM

## 2014-03-23 MED ORDER — LEVOTHYROXINE SODIUM 200 MCG PO TABS: 200.0000 ug | ORAL_TABLET | Freq: Every day | ORAL | Status: DC

## 2014-03-23 NOTE — Progress Notes (Signed)
X=.3% Sotradecol administered with a 27g butterfly.  Patient received a total of 11cc.  Treated any remaining vessels that were left since the last tx. Other treated areas are resolving as anticipated. Easy access. Tol well. This was her last ins covered tx. Follow prn.  Photos: No.  Compression stockings applied: Yes.

## 2014-04-11 ENCOUNTER — Other Ambulatory Visit: Payer: Self-pay | Admitting: Internal Medicine

## 2014-04-11 MED ORDER — LISINOPRIL 10 MG PO TABS: ORAL_TABLET | ORAL | Status: DC

## 2014-05-09 ENCOUNTER — Other Ambulatory Visit: Payer: Self-pay | Admitting: Internal Medicine

## 2014-05-25 ENCOUNTER — Encounter: Payer: Self-pay | Admitting: Physician Assistant

## 2014-05-25 ENCOUNTER — Ambulatory Visit (INDEPENDENT_AMBULATORY_CARE_PROVIDER_SITE_OTHER): Payer: 59 | Admitting: Physician Assistant

## 2014-05-25 VITALS — BP 100/60 | HR 76 | Temp 98.1°F | Resp 16 | Ht 64.5 in | Wt 129.0 lb

## 2014-05-25 DIAGNOSIS — E039 Hypothyroidism, unspecified: Secondary | ICD-10-CM

## 2014-05-25 DIAGNOSIS — Z79899 Other long term (current) drug therapy: Secondary | ICD-10-CM

## 2014-05-25 DIAGNOSIS — E119 Type 2 diabetes mellitus without complications: Secondary | ICD-10-CM

## 2014-05-25 DIAGNOSIS — I10 Essential (primary) hypertension: Secondary | ICD-10-CM

## 2014-05-25 DIAGNOSIS — E559 Vitamin D deficiency, unspecified: Secondary | ICD-10-CM

## 2014-05-25 DIAGNOSIS — E782 Mixed hyperlipidemia: Secondary | ICD-10-CM

## 2014-05-25 LAB — CBC WITH DIFFERENTIAL/PLATELET
BASOS PCT: 1 % (ref 0–1)
Basophils Absolute: 0.1 10*3/uL (ref 0.0–0.1)
EOS ABS: 0.2 10*3/uL (ref 0.0–0.7)
EOS PCT: 2 % (ref 0–5)
HEMATOCRIT: 44.3 % (ref 36.0–46.0)
Hemoglobin: 14.6 g/dL (ref 12.0–15.0)
LYMPHS PCT: 24 % (ref 12–46)
Lymphs Abs: 1.9 10*3/uL (ref 0.7–4.0)
MCH: 27.7 pg (ref 26.0–34.0)
MCHC: 33 g/dL (ref 30.0–36.0)
MCV: 83.9 fL (ref 78.0–100.0)
MONOS PCT: 6 % (ref 3–12)
MPV: 10.3 fL (ref 8.6–12.4)
Monocytes Absolute: 0.5 10*3/uL (ref 0.1–1.0)
NEUTROS ABS: 5.4 10*3/uL (ref 1.7–7.7)
NEUTROS PCT: 67 % (ref 43–77)
Platelets: 281 10*3/uL (ref 150–400)
RBC: 5.28 MIL/uL — ABNORMAL HIGH (ref 3.87–5.11)
RDW: 13.8 % (ref 11.5–15.5)
WBC: 8.1 10*3/uL (ref 4.0–10.5)

## 2014-05-25 LAB — BASIC METABOLIC PANEL WITH GFR
BUN: 26 mg/dL — ABNORMAL HIGH (ref 6–23)
CALCIUM: 9.8 mg/dL (ref 8.4–10.5)
CHLORIDE: 105 meq/L (ref 96–112)
CO2: 23 mEq/L (ref 19–32)
Creat: 0.66 mg/dL (ref 0.50–1.10)
Glucose, Bld: 113 mg/dL — ABNORMAL HIGH (ref 70–99)
POTASSIUM: 4.5 meq/L (ref 3.5–5.3)
Sodium: 141 mEq/L (ref 135–145)

## 2014-05-25 LAB — HEPATIC FUNCTION PANEL
ALBUMIN: 4.2 g/dL (ref 3.5–5.2)
ALT: 19 U/L (ref 0–35)
AST: 13 U/L (ref 0–37)
Alkaline Phosphatase: 57 U/L (ref 39–117)
BILIRUBIN DIRECT: 0.1 mg/dL (ref 0.0–0.3)
BILIRUBIN TOTAL: 0.4 mg/dL (ref 0.2–1.2)
Indirect Bilirubin: 0.3 mg/dL (ref 0.2–1.2)
Total Protein: 6.3 g/dL (ref 6.0–8.3)

## 2014-05-25 LAB — LIPID PANEL
CHOL/HDL RATIO: 2.2 ratio
CHOLESTEROL: 132 mg/dL (ref 0–200)
HDL: 59 mg/dL (ref >=46)
LDL Cholesterol: 56 mg/dL (ref 0–99)
Triglycerides: 86 mg/dL (ref <150)
VLDL: 17 mg/dL (ref 0–40)

## 2014-05-25 LAB — TSH: TSH: 0.537 u[IU]/mL (ref 0.350–4.500)

## 2014-05-25 LAB — MAGNESIUM: MAGNESIUM: 2.1 mg/dL (ref 1.5–2.5)

## 2014-05-25 NOTE — Patient Instructions (Signed)
Diabetes is a very complicated disease...lets simplify it.  An easy way to look at it to understand the complications is if you think of the extra sugar floating in your blood stream as glass shards floating through your blood stream.    Diabetes affects your small vessels first: 1) The glass shards (sugar) scraps down the tiny blood vessels in your eyes and lead to diabetic retinopathy, the leading cause of blindness in the [place]. Diabetes is the leading cause of newly diagnosed adult (33 to 57 years of age) blindness in the [place].  2) The glass shards scratches down the tiny vessels of your legs leading to nerve damage called neuropathy and can lead to amputations of your feet. More than 60% of all non-traumatic amputations of lower limbs occur in people with diabetes.  3) Over time the small vessels in your brain are shredded and closed off, individually this does not cause any problems but over a long period of time many of the small vessels being blocked can lead to Vascular Dementia.   4) Your kidney's are a filter system and have a "net" that keeps certain things in the body and lets bad things out. Sugar shreds this net and leads to kidney damage and eventually failure. Decreasing the sugar that is destroying the net and certain blood pressure medications can help stop or decrease progression of kidney disease. Diabetes was the primary cause of kidney failure in 44 percent of all new cases in 2011.  5) Diabetes also destroys the small vessels in your penis that lead to erectile dysfunction. Eventually the vessels are so damaged that you may not be responsive to cialis or viagra.   Diabetes and your large vessels: Your larger vessels consist of your coronary arteries in your heart and the carotid vessels to your brain. Diabetes or even increased sugars put you at 300% increased risk of heart attack and stroke and this is why.. The sugar scrapes down your large blood vessels and your body  sees this as an internal injury and tries to repair itself. Just like you get a scab on your skin, your platelets will stick to the blood vessel wall trying to heal it. This is why we have diabetics on low dose aspirin daily, this prevents the platelets from sticking and can prevent plaque formation. In addition, your body takes cholesterol and tries to shove it into the open wound. This is why we want your LDL, or bad cholesterol, below 70.   The combination of platelets and cholesterol over 5-10 years forms plaque that can break off and cause a heart attack or stroke.   PLEASE REMEMBER:  Diabetes is preventable! Up to 40 percent of complications and morbidities among individuals with type 2 diabetes can be prevented, delayed, or effectively treated and minimized with regular visits to a health professional, appropriate monitoring and medication, and a healthy diet and lifestyle.   Your A1C is a measure of your sugar over the past 3 months and is not affected by what you have eaten over the past few days. Diabetes increases your chances of stroke and heart attack over 300 % and is the leading cause of blindness and kidney failure in the [place]. Please make sure you decrease bad carbs like white bread, white rice, potatoes, corn, soft drinks, pasta, cereals, refined sugars, sweet tea, dried fruits, and fruit juice. Good carbs are okay to eat in moderation like sweet potatoes, brown rice, whole grain pasta/bread, most fruit (except  dried fruit) and you can eat as many veggies as you want.   Greater than 6.5 is considered diabetic. Between 6.4 and 5.7 is prediabetic If your A1C is less than 5.7 you are NOT diabetic.  Targets for Glucose Readings: Time of Check Target for patients WITHOUT Diabetes Target for DIABETICS  Before Meals Less than 100  less than 150  Two hours after meals Less than 200  Less than 250

## 2014-05-25 NOTE — Progress Notes (Signed)
Assessment and Plan:  1. Hypertension -Continue medication, monitor blood pressure at home. Continue DASH diet.  Reminder to go to the ER if any CP, SOB, nausea, dizziness, severe HA, changes vision/speech, left arm numbness and tingling and jaw pain.  2. Cholesterol -Continue diet and exercise. Check cholesterol.   3. Diabetes without complications -Continue diet and exercise. Check A1C  4. Vitamin D Def - check level and continue medications.   5. Hypothyroidism -check TSH level, continue medications the same, reminded to take on an empty stomach 30-29mins before food.   Continue diet and meds as discussed. Further disposition pending results of labs. Discussed med's effects and SE's.   Over 30 minutes of exam, counseling, chart review, and critical decision making was performed   HPI 59 y.o. female  presents for 3 month follow up on hypertension, cholesterol, diabetes and vitamin D deficiency.   Her blood pressure has been controlled at home, today her BP is BP: 100/60 mmHg.  She does workout. She denies chest pain, shortness of breath, dizziness.  She is on cholesterol medication, lipitor 80 mg 1/2 QD and denies myalgias. Her cholesterol is at goal. The cholesterol was:   Lab Results  Component Value Date   CHOL 129 02/08/2014   HDL 53 02/08/2014   LDLCALC 48 02/08/2014   TRIG 142 02/08/2014   CHOLHDL 2.4 02/08/2014  She has been diabetic x 10 years.  She has been working on diet and exercise for diabetes without complications, she is on bASA, she is on ACE/ARB, she is on farxiga and MF and denies  paresthesia of the feet, polydipsia, polyuria and visual disturbances. Last A1C was:  Lab Results  Component Value Date   HGBA1C 7.2* 02/08/2014  Patient is on Vitamin D supplement. Lab Results  Component Value Date   VD25OH 64 02/08/2014  She is on thyroid medication. Her medication was not changed last visit.   Lab Results  Component Value Date   TSH 0.688 03/15/2014  .    Current Medications:  Current Outpatient Prescriptions on File Prior to Visit  Medication Sig Dispense Refill  . aspirin EC 81 MG tablet Take 1 tablet (81 mg total) by mouth daily. 150 tablet 2  . atorvastatin (LIPITOR) 80 MG tablet Take 1 tablet (80 mg total) by mouth daily. 30 tablet 6  . cholecalciferol (VITAMIN D) 1000 UNITS tablet Take 4,000 Units by mouth daily.    . dapagliflozin propanediol (FARXIGA) 10 MG TABS tablet Take 1/2 to 1 tablet daily for diabetes 30 tablet 99  . estradiol (ESTRACE) 2 MG tablet Take 2 mg by mouth daily.     . ferrous fumarate (HEMOCYTE - 106 MG FE) 325 (106 FE) MG TABS Take 1 tablet by mouth daily.    Marland Kitchen levothyroxine (SYNTHROID, LEVOTHROID) 200 MCG tablet Take 1 tablet (200 mcg total) by mouth daily before breakfast. Takes 1 tab on M,W,F and 1/2 tab the other 4 days of the week 90 tablet 1  . lisinopril (PRINIVIL,ZESTRIL) 10 MG tablet take 1/2 or 1 tablet by mouth once daily 90 tablet 0  . Magnesium 500 MG CAPS Take 500 mg by mouth daily.    . medroxyPROGESTERone (PROVERA) 5 MG tablet Take 1 tablet 10 days every 3 months    . metFORMIN (GLUCOPHAGE-XR) 500 MG 24 hr tablet TAKE 1 TABLET BY MOUTH DAILY WITH BREAKFAST AND 1 AT LUNCH AND 2 WITHSUPPER 120 tablet 1  . Omega-3 Fatty Acids (FISH OIL) 1200 MG CAPS Take 1 capsule (  1,200 mg total) by mouth 2 (two) times daily.    Marland Kitchen PREMARIN 0.625 MG tablet 0.625 mg daily.      No current facility-administered medications on file prior to visit.   Medical History:  Past Medical History  Diagnosis Date  . Iron deficiency anemia 01/17/2011  . Hypertension   . Hyperlipidemia   . Diabetes mellitus without complication   . Thyroid disease   . Vitamin D deficiency   . Type II or unspecified type diabetes mellitus without mention of complication, not stated as uncontrolled 02/04/2013  . Varicose veins    Allergies: No Known Allergies   Review of Systems:  Review of Systems  Constitutional: Negative.   HENT:  Negative.   Eyes: Negative.   Respiratory: Negative.   Cardiovascular: Negative.   Gastrointestinal: Negative.   Genitourinary: Negative.   Musculoskeletal: Negative.   Skin: Negative.   Neurological: Negative.   Endo/Heme/Allergies: Negative.   Psychiatric/Behavioral: Negative.     Family history- Review and unchanged Social history- Review and unchanged Physical Exam: BP 100/60 mmHg  Pulse 76  Temp(Src) 98.1 F (36.7 C)  Resp 16  Ht 5' 4.5" (1.638 m)  Wt 129 lb (58.514 kg)  BMI 21.81 kg/m2 Wt Readings from Last 3 Encounters:  05/25/14 129 lb (58.514 kg)  02/08/14 133 lb 3.2 oz (60.419 kg)  01/24/14 130 lb (58.968 kg)   General Appearance: Well nourished, in no apparent distress. Eyes: PERRLA, EOMs, conjunctiva no swelling or erythema Sinuses: No Frontal/maxillary tenderness ENT/Mouth: Ext aud canals clear, TMs without erythema, bulging. No erythema, swelling, or exudate on post pharynx.  Tonsils not swollen or erythematous. Hearing normal.  Neck: Supple, thyroid normal.  Respiratory: Respiratory effort normal, BS equal bilaterally without rales, rhonchi, wheezing or stridor.  Cardio: RRR with no MRGs. Brisk peripheral pulses without edema.  Abdomen: Soft, + BS.  Non tender, no guarding, rebound, hernias, masses. Lymphatics: Non tender without lymphadenopathy.  Musculoskeletal: Full ROM, 5/5 strength, Normal gait Skin: Warm, dry without rashes, lesions, ecchymosis.  Neuro: Cranial nerves intact. No cerebellar symptoms.  Psych: Awake and oriented X 3, normal affect, Insight and Judgment appropriate.    Vicie Mutters, PA-C 8:55 AM [facility]

## 2014-05-26 LAB — HEMOGLOBIN A1C
Hgb A1c MFr Bld: 6.7 % — ABNORMAL HIGH (ref <5.7)
MEAN PLASMA GLUCOSE: 146 mg/dL — AB (ref <117)

## 2014-05-26 LAB — VITAMIN D 25 HYDROXY (VIT D DEFICIENCY, FRACTURES): Vit D, 25-Hydroxy: 59 ng/mL (ref 30–100)

## 2014-07-06 ENCOUNTER — Other Ambulatory Visit: Payer: Self-pay | Admitting: Internal Medicine

## 2014-07-18 ENCOUNTER — Encounter: Payer: Self-pay | Admitting: Internal Medicine

## 2014-07-18 ENCOUNTER — Other Ambulatory Visit: Payer: Self-pay

## 2014-07-18 DIAGNOSIS — E119 Type 2 diabetes mellitus without complications: Secondary | ICD-10-CM

## 2014-07-18 MED ORDER — DAPAGLIFLOZIN PROPANEDIOL 10 MG PO TABS: ORAL_TABLET | ORAL | Status: DC

## 2014-07-24 ENCOUNTER — Encounter: Payer: Self-pay | Admitting: *Deleted

## 2014-08-31 ENCOUNTER — Encounter: Payer: Self-pay | Admitting: Internal Medicine

## 2014-08-31 ENCOUNTER — Ambulatory Visit (INDEPENDENT_AMBULATORY_CARE_PROVIDER_SITE_OTHER): Payer: 59 | Admitting: Internal Medicine

## 2014-08-31 VITALS — BP 94/62 | HR 80 | Temp 97.5°F | Resp 16 | Ht 64.5 in | Wt 130.4 lb

## 2014-08-31 DIAGNOSIS — R5383 Other fatigue: Secondary | ICD-10-CM

## 2014-08-31 DIAGNOSIS — D509 Iron deficiency anemia, unspecified: Secondary | ICD-10-CM

## 2014-08-31 DIAGNOSIS — E782 Mixed hyperlipidemia: Secondary | ICD-10-CM

## 2014-08-31 DIAGNOSIS — Z6822 Body mass index (BMI) 22.0-22.9, adult: Secondary | ICD-10-CM

## 2014-08-31 DIAGNOSIS — E559 Vitamin D deficiency, unspecified: Secondary | ICD-10-CM

## 2014-08-31 DIAGNOSIS — Z79899 Other long term (current) drug therapy: Secondary | ICD-10-CM

## 2014-08-31 DIAGNOSIS — E039 Hypothyroidism, unspecified: Secondary | ICD-10-CM

## 2014-08-31 DIAGNOSIS — I10 Essential (primary) hypertension: Secondary | ICD-10-CM

## 2014-08-31 DIAGNOSIS — E119 Type 2 diabetes mellitus without complications: Secondary | ICD-10-CM

## 2014-08-31 LAB — CBC WITH DIFFERENTIAL/PLATELET
Basophils Absolute: 0.1 10*3/uL (ref 0.0–0.1)
Basophils Relative: 1 % (ref 0–1)
Eosinophils Absolute: 0.2 10*3/uL (ref 0.0–0.7)
Eosinophils Relative: 3 % (ref 0–5)
HEMATOCRIT: 44.6 % (ref 36.0–46.0)
HEMOGLOBIN: 14.7 g/dL (ref 12.0–15.0)
LYMPHS ABS: 1.7 10*3/uL (ref 0.7–4.0)
Lymphocytes Relative: 29 % (ref 12–46)
MCH: 27.4 pg (ref 26.0–34.0)
MCHC: 33 g/dL (ref 30.0–36.0)
MCV: 83.2 fL (ref 78.0–100.0)
MPV: 10.2 fL (ref 8.6–12.4)
Monocytes Absolute: 0.4 10*3/uL (ref 0.1–1.0)
Monocytes Relative: 7 % (ref 3–12)
Neutro Abs: 3.6 10*3/uL (ref 1.7–7.7)
Neutrophils Relative %: 60 % (ref 43–77)
Platelets: 300 10*3/uL (ref 150–400)
RBC: 5.36 MIL/uL — ABNORMAL HIGH (ref 3.87–5.11)
RDW: 13.9 % (ref 11.5–15.5)
WBC: 6 10*3/uL (ref 4.0–10.5)

## 2014-08-31 LAB — BASIC METABOLIC PANEL WITH GFR
BUN: 29 mg/dL — AB (ref 6–23)
CO2: 26 meq/L (ref 19–32)
Calcium: 9.9 mg/dL (ref 8.4–10.5)
Chloride: 102 mEq/L (ref 96–112)
Creat: 0.75 mg/dL (ref 0.50–1.10)
GFR, Est African American: >89 mL/min
GFR, Est Non African American: 89 mL/min
Glucose, Bld: 150 mg/dL — ABNORMAL HIGH (ref 70–99)
POTASSIUM: 4.9 meq/L (ref 3.5–5.3)
Sodium: 142 mEq/L (ref 135–145)

## 2014-08-31 LAB — HEPATIC FUNCTION PANEL
ALBUMIN: 3.8 g/dL (ref 3.5–5.2)
ALT: 21 U/L (ref 0–35)
AST: 15 U/L (ref 0–37)
Alkaline Phosphatase: 66 U/L (ref 39–117)
Bilirubin, Direct: 0.1 mg/dL (ref 0.0–0.3)
Indirect Bilirubin: 0.2 mg/dL (ref 0.2–1.2)
Total Bilirubin: 0.3 mg/dL (ref 0.2–1.2)
Total Protein: 6.2 g/dL (ref 6.0–8.3)

## 2014-08-31 LAB — TSH: TSH: 3.28 u[IU]/mL (ref 0.350–4.500)

## 2014-08-31 LAB — HEMOGLOBIN A1C
Hgb A1c MFr Bld: 6.8 % — ABNORMAL HIGH (ref <5.7)
Mean Plasma Glucose: 148 mg/dL — ABNORMAL HIGH (ref <117)

## 2014-08-31 LAB — IRON AND TIBC
%SAT: 13 % — ABNORMAL LOW (ref 20–55)
Iron: 50 ug/dL (ref 42–145)
TIBC: 399 ug/dL (ref 250–470)
UIBC: 349 ug/dL (ref 125–400)

## 2014-08-31 LAB — LIPID PANEL
CHOLESTEROL: 145 mg/dL (ref 0–200)
HDL: 64 mg/dL (ref >=46)
LDL Cholesterol: 61 mg/dL (ref 0–99)
Total CHOL/HDL Ratio: 2.3 Ratio
Triglycerides: 99 mg/dL (ref <150)
VLDL: 20 mg/dL (ref 0–40)

## 2014-08-31 LAB — MAGNESIUM: Magnesium: 2.2 mg/dL (ref 1.5–2.5)

## 2014-08-31 NOTE — Progress Notes (Signed)
Patient ID: LIBIA FAZZINI, female   DOB: 1955-09-27, 59 y.o.   MRN: [id number]   This very nice 59 y.o. MWF presents for  follow up with Hypertension, Hyperlipidemia, T2_NIDDM, Hypothyroidism and Vitamin D Deficiency.    Patient is followed expectantly  for HTN since 2004 & BP has been controlled at home. Today's BP: 94/62 mmHg. Patient has had no complaints of any cardiac type chest pain, palpitations, dyspnea/orthopnea/PND, dizziness, claudication, or dependent edema.   Hyperlipidemia is controlled with diet & meds. Patient denies myalgias or other med SE's. Last Lipids were at goal -  Cholesterol 132; HDL 59; LDL Cholesterol 56; Triglycerides 86 on 05/25/2014   Also, the patient has history of T2_NIDDM predating since 2003 and was finally started on Metformin in Feb 2014. She reports FBG's range <140 mg% and has had no symptoms of reactive hypoglycemia, diabetic polys, paresthesias or visual blurring.  Last A1c was 6.7% on  3/23/201.    Patient was treated for Graves Dz in 1991 with RAI-131 & has been on replacement therapy since. Further, the patient also has history of Vitamin D Deficiency and supplements vitamin D without any suspected side-effects. Last vitamin D was 59 on 05/25/2014.  Medication Sig  . aspirin EC 81 MG tablet Take 1 tablet (81 mg total) by mouth daily.  Marland Kitchen atorvastatin  80 MG tablet Take 1 tablet (80 mg total) by mouth daily.  Marland Kitchen VITAMIN D 1000 UNITS tablet Take 4,000 Units by mouth daily.  . dapagliflozin  (FARXIGA) 10 MG TABS tablet Take 1/2 to 1 tablet daily for diabetes  . estradiol (ESTRACE) 2 MG tablet Take 2 mg by mouth daily.   Marland Kitchen HEMOCYTE - 106 MG FE) 325 (106 FE) MG  Take 1 tablet by mouth daily.  Marland Kitchen levothyroxine  200 MCG tablet  Takes 1 tab on SAT and 1/2 tab the other 6 days of the week)  . lisinopril ( 10 MG tablet take 1/2 or 1 tablet by mouth once daily  . Magnesium 500 MG CAPS Take 500 mg by mouth daily.  Marland Kitchen PROVERA) 5 MG tablet Take 1 tablet 10 days every 3  months  . metFORMIN (-XR) 500 MG 24 hr tablet TAKES 4 tabs /Daily (1-1-2 tid pc)  . FISH OIL 1200 MG CAPS Take 1 cap 2 times daily.  Marland Kitchen PREMARIN 0.625 MG tablet 0.625 mg daily.    No Known Allergies  PMHx:   Past Medical History  Diagnosis Date  . Iron deficiency anemia 01/17/2011  . Hypertension   . Hyperlipidemia   . Diabetes mellitus without complication   . Thyroid disease   . Vitamin D deficiency   . Type II or unspecified type diabetes mellitus without mention of complication, not stated as uncontrolled 02/04/2013  . Varicose veins    Immunization History  Administered Date(s) Administered  . Influenza Split 12/06/2013  . Influenza Whole 01/28/2012  . PPD Test 02/08/2014  . Tdap 03/04/2006   Past Surgical History  Procedure Laterality Date  . Vein ligation and stripping Left    FHx:    Reviewed / unchanged  SHx:    Reviewed / unchanged  Systems Review:  Constitutional: Denies fever, chills, wt changes, headaches, insomnia, fatigue, night sweats, change in appetite. Eyes: Denies redness, blurred vision, diplopia, discharge, itchy, watery eyes.  ENT: Denies discharge, congestion, post nasal drip, epistaxis, sore throat, earache, hearing loss, dental pain, tinnitus, vertigo, sinus pain, snoring.  CV: Denies chest pain, palpitations, irregular heartbeat, syncope, dyspnea, diaphoresis,  orthopnea, PND, claudication or edema. Respiratory: denies cough, dyspnea, DOE, pleurisy, hoarseness, laryngitis, wheezing.  Gastrointestinal: Denies dysphagia, odynophagia, heartburn, reflux, water brash, abdominal pain or cramps, nausea, vomiting, bloating, diarrhea, constipation, hematemesis, melena, hematochezia  or hemorrhoids. Genitourinary: Denies dysuria, frequency, urgency, nocturia, hesitancy, discharge, hematuria or flank pain. Musculoskeletal: Denies arthralgias, myalgias, stiffness, jt. swelling, pain, limping or strain/sprain.  Skin: Denies pruritus, rash, hives, warts, acne,  eczema or change in skin lesion(s). Neuro: No weakness, tremor, incoordination, spasms, paresthesia or pain. Psychiatric: Denies confusion, memory loss or sensory loss. Endo: Denies change in weight, skin or hair change.  Heme/Lymph: No excessive bleeding, bruising or enlarged lymph nodes.  Physical Exam  BP 94/62 m  Pulse 80  Temp 97.5 F   Resp 16  Ht 5' 4.5"   Wt 130 lb 6.4 oz     BMI 22.05   Appears well nourished and in no distress. Eyes: PERRLA, EOMs, conjunctiva no swelling or erythema. Sinuses: No frontal/maxillary tenderness ENT/Mouth: EAC's clear, TM's nl w/o erythema, bulging. Nares clear w/o erythema, swelling, exudates. Oropharynx clear without erythema or exudates. Oral hygiene is good. Tongue normal, non obstructing. Hearing intact.  Neck: Supple. Thyroid nl. Car 2+/2+ without bruits, nodes or JVD. Chest: Respirations nl with BS clear & equal w/o rales, rhonchi, wheezing or stridor.  Cor: Heart sounds normal w/ regular rate and rhythm without sig. murmurs, gallops, clicks, or rubs. Peripheral pulses normal and equal  without edema.  Abdomen: Soft & bowel sounds normal. Non-tender w/o guarding, rebound, hernias, masses, or organomegaly.  Lymphatics: Unremarkable.  Musculoskeletal: Full ROM all peripheral extremities, joint stability, 5/5 strength, and normal gait.  Skin: Warm, dry without exposed rashes, lesions or ecchymosis apparent.  Neuro: Cranial nerves intact, reflexes equal bilaterally. Sensory-motor testing grossly intact. Tendon reflexes grossly intact.  Pysch: Alert & oriented x 3.  Insight and judgement nl & appropriate. No ideations.  Assessment and Plan:  1. Essential hypertension  - TSH  2. Mixed hyperlipidemia  - Lipid panel  3. T2_NIDDM  - Hemoglobin A1c - Insulin, random  4. Hypothyroidism  - TSH  5. Iron deficiency anemia  - Iron and TIBC  6. Other fatigue  - Iron and TIBC - TSH  7. Vitamin D deficiency  - Vit D  25 hydroxy    8. Body mass index (BMI) of 22.0-22.9 in adult   9. Medication management  - CBC with Differential/Platelet - BASIC METABOLIC PANEL WITH GFR - Hepatic function panel - Magnesium   Recommended regular exercise, BP monitoring, weight control, and discussed med and SE's. Recommended labs to assess and monitor clinical status. Further disposition pending results of labs. Over 30 minutes of exam, counseling, chart review was performed

## 2014-08-31 NOTE — Patient Instructions (Signed)

## 2014-09-01 LAB — VITAMIN D 25 HYDROXY (VIT D DEFICIENCY, FRACTURES): Vit D, 25-Hydroxy: 67 ng/mL (ref 30–100)

## 2014-09-01 LAB — INSULIN, RANDOM: INSULIN: 4.9 u[IU]/mL (ref 2.0–19.6)

## 2014-09-22 ENCOUNTER — Other Ambulatory Visit: Payer: Self-pay | Admitting: Internal Medicine

## 2014-11-03 ENCOUNTER — Other Ambulatory Visit: Payer: Self-pay | Admitting: Internal Medicine

## 2014-11-28 ENCOUNTER — Other Ambulatory Visit: Payer: Self-pay | Admitting: Internal Medicine

## 2014-12-08 ENCOUNTER — Ambulatory Visit (INDEPENDENT_AMBULATORY_CARE_PROVIDER_SITE_OTHER): Payer: 59 | Admitting: Internal Medicine

## 2014-12-08 ENCOUNTER — Encounter: Payer: Self-pay | Admitting: Internal Medicine

## 2014-12-08 VITALS — BP 104/70 | HR 62 | Temp 98.2°F | Resp 16 | Ht 64.5 in | Wt 139.0 lb

## 2014-12-08 DIAGNOSIS — Z79899 Other long term (current) drug therapy: Secondary | ICD-10-CM | POA: Diagnosis not present

## 2014-12-08 DIAGNOSIS — E559 Vitamin D deficiency, unspecified: Secondary | ICD-10-CM | POA: Diagnosis not present

## 2014-12-08 DIAGNOSIS — E119 Type 2 diabetes mellitus without complications: Secondary | ICD-10-CM

## 2014-12-08 DIAGNOSIS — E782 Mixed hyperlipidemia: Secondary | ICD-10-CM | POA: Diagnosis not present

## 2014-12-08 DIAGNOSIS — I10 Essential (primary) hypertension: Secondary | ICD-10-CM | POA: Diagnosis not present

## 2014-12-08 DIAGNOSIS — E039 Hypothyroidism, unspecified: Secondary | ICD-10-CM

## 2014-12-08 DIAGNOSIS — Z23 Encounter for immunization: Secondary | ICD-10-CM | POA: Diagnosis not present

## 2014-12-08 LAB — HEPATIC FUNCTION PANEL
ALK PHOS: 61 U/L (ref 33–130)
ALT: 20 U/L (ref 6–29)
AST: 13 U/L (ref 10–35)
Albumin: 4.2 g/dL (ref 3.6–5.1)
BILIRUBIN DIRECT: 0.1 mg/dL (ref <=0.2)
BILIRUBIN TOTAL: 0.3 mg/dL (ref 0.2–1.2)
Indirect Bilirubin: 0.2 mg/dL (ref 0.2–1.2)
Total Protein: 6.3 g/dL (ref 6.1–8.1)

## 2014-12-08 LAB — CBC WITH DIFFERENTIAL/PLATELET
BASOS PCT: 1 % (ref 0–1)
Basophils Absolute: 0.1 10*3/uL (ref 0.0–0.1)
Eosinophils Absolute: 0.2 10*3/uL (ref 0.0–0.7)
Eosinophils Relative: 3 % (ref 0–5)
HCT: 45.1 % (ref 36.0–46.0)
HEMOGLOBIN: 15 g/dL (ref 12.0–15.0)
LYMPHS PCT: 27 % (ref 12–46)
Lymphs Abs: 1.9 10*3/uL (ref 0.7–4.0)
MCH: 27.8 pg (ref 26.0–34.0)
MCHC: 33.3 g/dL (ref 30.0–36.0)
MCV: 83.5 fL (ref 78.0–100.0)
MPV: 10.9 fL (ref 8.6–12.4)
Monocytes Absolute: 0.4 10*3/uL (ref 0.1–1.0)
Monocytes Relative: 6 % (ref 3–12)
NEUTROS PCT: 63 % (ref 43–77)
Neutro Abs: 4.4 10*3/uL (ref 1.7–7.7)
Platelets: 311 10*3/uL (ref 150–400)
RBC: 5.4 MIL/uL — AB (ref 3.87–5.11)
RDW: 13.5 % (ref 11.5–15.5)
WBC: 7 10*3/uL (ref 4.0–10.5)

## 2014-12-08 LAB — BASIC METABOLIC PANEL WITH GFR
BUN: 28 mg/dL — ABNORMAL HIGH (ref 7–25)
CO2: 30 mmol/L (ref 20–31)
Calcium: 10.1 mg/dL (ref 8.6–10.4)
Chloride: 102 mmol/L (ref 98–110)
Creat: 0.77 mg/dL (ref 0.50–1.05)
GFR, EST NON AFRICAN AMERICAN: 86 mL/min (ref >=60)
GFR, Est African American: >89 mL/min (ref >=60)
Glucose, Bld: 104 mg/dL — ABNORMAL HIGH (ref 65–99)
Potassium: 5 mmol/L (ref 3.5–5.3)
SODIUM: 141 mmol/L (ref 135–146)

## 2014-12-08 LAB — HEMOGLOBIN A1C
HEMOGLOBIN A1C: 7.2 % — AB (ref <5.7)
Mean Plasma Glucose: 160 mg/dL — ABNORMAL HIGH (ref <117)

## 2014-12-08 LAB — LIPID PANEL
CHOL/HDL RATIO: 2.4 ratio (ref <=5.0)
Cholesterol: 146 mg/dL (ref 125–200)
HDL: 62 mg/dL (ref >=46)
LDL CALC: 59 mg/dL (ref <130)
Triglycerides: 125 mg/dL (ref <150)
VLDL: 25 mg/dL (ref <30)

## 2014-12-08 LAB — TSH: TSH: 4.988 u[IU]/mL — AB (ref 0.350–4.500)

## 2014-12-08 LAB — MAGNESIUM: Magnesium: 1.9 mg/dL (ref 1.5–2.5)

## 2014-12-08 NOTE — Progress Notes (Signed)
Patient ID: Mary Frank, female   DOB: 1955-04-27, 59 y.o.   MRN: [id number]  Assessment and Plan:  Hypertension:  -Continue medication -monitor blood pressure at home. -Continue DASH diet -Reminder to go to the ER if any CP, SOB, nausea, dizziness, severe HA, changes vision/speech, left arm numbness and tingling and jaw pain.  Cholesterol - Continue diet and exercise -Check cholesterol.   Diabetes without complications -Continue diet and exercise.  -Check A1C  Vitamin D Def -check level -continue medications.   Continue diet and meds as discussed. Further disposition pending results of labs. Discussed med's effects and SE's.    HPI 59 y.o. female  presents for 3 month follow up with hypertension, hyperlipidemia, diabetes and vitamin D deficiency.   Her blood pressure has been controlled at home, today their BP is BP: 104/70 mmHg.She does workout. She denies chest pain, shortness of breath, dizziness.   She is on cholesterol medication and denies myalgias. Her cholesterol is at goal. The cholesterol was:  08/31/2014: Cholesterol 145; HDL 64; LDL Cholesterol 61; Triglycerides 99   She has been working on diet and exercise for diabetes without complications, she is on bASA, she is on ACE/ARB, and denies  foot ulcerations, hyperglycemia, hypoglycemia , increased appetite, nausea, paresthesia of the feet, polydipsia, polyuria, visual disturbances, vomiting and weight loss. Last A1C was: 08/31/2014: Hgb A1c MFr Bld 6.8*   Patient is on Vitamin D supplement. 08/31/2014: Vit D, 25-Hydroxy 67    Current Medications:  Current Outpatient Prescriptions on File Prior to Visit  Medication Sig Dispense Refill  . aspirin EC 81 MG tablet Take 1 tablet (81 mg total) by mouth daily. 150 tablet 2  . atorvastatin (LIPITOR) 80 MG tablet TAKE 1 TABLET BY MOUTH DAILY 30 tablet 1  . cholecalciferol (VITAMIN D) 1000 UNITS tablet Take 6,000 Units by mouth daily.     . dapagliflozin propanediol (FARXIGA)  10 MG TABS tablet Take 1/2 to 1 tablet daily for diabetes 30 tablet PRN  . estradiol (ESTRACE) 2 MG tablet Take 2 mg by mouth daily.     . ferrous fumarate (HEMOCYTE - 106 MG FE) 325 (106 FE) MG TABS Take 1 tablet by mouth daily.    Marland Kitchen levothyroxine (SYNTHROID, LEVOTHROID) 200 MCG tablet Take 1 tablet (200 mcg total) by mouth daily before breakfast. Takes 1 tab on M,W,F and 1/2 tab the other 4 days of the week (Patient taking differently: Take 200 mcg by mouth daily before breakfast. Takes 1 tab on SAT and 1/2 tab the other 6 days of the week) 90 tablet 1  . lisinopril (PRINIVIL,ZESTRIL) 10 MG tablet TAKE 1/2-1 TABLET BY MOUTH EVERYDAY 90 tablet 1  . Magnesium 500 MG CAPS Take 500 mg by mouth daily.    . medroxyPROGESTERone (PROVERA) 5 MG tablet Take 1 tablet 10 days every 3 months    . metFORMIN (GLUCOPHAGE-XR) 500 MG 24 hr tablet TAKE 1 TABLET BY MOUTH EACH MORNING WITHBREAKFAST, 1 TABLET AT LUNCH AND 2 TABLETS EACH NIGHT WITH SUPPER 360 tablet 1  . Omega-3 Fatty Acids (FISH OIL) 1200 MG CAPS Take 1 capsule (1,200 mg total) by mouth 2 (two) times daily.    Marland Kitchen OVER THE COUNTER MEDICATION Takes Biotin 1 daily.    Marland Kitchen PREMARIN 0.625 MG tablet 0.625 mg daily.      No current facility-administered medications on file prior to visit.   Medical History:  Past Medical History  Diagnosis Date  . Iron deficiency anemia 01/17/2011  . Hypertension   .  Hyperlipidemia   . Diabetes mellitus without complication ([place])   . Thyroid disease   . Vitamin D deficiency   . Type II or unspecified type diabetes mellitus without mention of complication, not stated as uncontrolled 02/04/2013  . Varicose veins    Allergies: No Known Allergies   Review of Systems:  Review of Systems  Constitutional: Negative for fever.  HENT: Negative for congestion, ear pain and sore throat.   Eyes: Negative.   Respiratory: Negative for cough, shortness of breath and wheezing.   Cardiovascular: Negative for chest pain,  palpitations and leg swelling.  Gastrointestinal: Negative for heartburn, diarrhea, constipation, blood in stool and melena.  Genitourinary: Negative.   Musculoskeletal: Negative.   Skin: Negative.   Neurological: Negative for dizziness, sensory change, loss of consciousness and headaches.  Psychiatric/Behavioral: Negative for depression. The patient is not nervous/anxious and does not have insomnia.     Family history- Review and unchanged  Social history- Review and unchanged  Physical Exam: BP 104/70 mmHg  Pulse 62  Temp(Src) 98.2 F (36.8 C) (Temporal)  Resp 16  Ht 5' 4.5" (1.638 m)  Wt 139 lb (63.05 kg)  BMI 23.50 kg/m2  LMP 08/11/2013 Wt Readings from Last 3 Encounters:  12/08/14 139 lb (63.05 kg)  08/31/14 130 lb 6.4 oz (59.149 kg)  05/25/14 129 lb (58.514 kg)   General Appearance: Well nourished well developed, non-toxic appearing, in no apparent distress. Eyes: PERRLA, EOMs, conjunctiva no swelling or erythema ENT/Mouth: Ear canals clear with no erythema, swelling, or discharge.  TMs normal bilaterally, oropharynx clear, moist, with no exudate.   Neck: Supple, thyroid normal, no JVD, no cervical adenopathy.  Respiratory: Respiratory effort normal, breath sounds clear A&P, no wheeze, rhonchi or rales noted.  No retractions, no accessory muscle usage Cardio: RRR with no MRGs. No noted edema.  Abdomen: Soft, + BS.  Non tender, no guarding, rebound, hernias, masses. Musculoskeletal: Full ROM, 5/5 strength, Normal gait Skin: Warm, dry without rashes, lesions, ecchymosis.  Neuro: Awake and oriented X 3, Cranial nerves intact. No cerebellar symptoms.  Psych: normal affect, Insight and Judgment appropriate.    Starlyn Skeans, PA-C 8:54 AM [facility]

## 2014-12-08 NOTE — Addendum Note (Signed)
Addended by: Jelina Paulsen A on: 12/08/2014 09:30 AM   Modules accepted: Orders

## 2014-12-09 LAB — INSULIN, RANDOM: Insulin: 2.4 u[IU]/mL (ref 2.0–19.6)

## 2014-12-09 LAB — VITAMIN D 25 HYDROXY (VIT D DEFICIENCY, FRACTURES): Vit D, 25-Hydroxy: 75 ng/mL (ref 30–100)

## 2015-01-20 ENCOUNTER — Other Ambulatory Visit: Payer: Self-pay

## 2015-01-20 MED ORDER — LEVOTHYROXINE SODIUM 200 MCG PO TABS: 200.0000 ug | ORAL_TABLET | Freq: Every day | ORAL | Status: DC

## 2015-02-23 ENCOUNTER — Encounter: Payer: Self-pay | Admitting: Internal Medicine

## 2015-03-05 HISTORY — PX: VEIN LIGATION AND STRIPPING: SHX2653

## 2015-03-15 ENCOUNTER — Ambulatory Visit (INDEPENDENT_AMBULATORY_CARE_PROVIDER_SITE_OTHER): Payer: 59 | Admitting: Internal Medicine

## 2015-03-15 ENCOUNTER — Encounter: Payer: Self-pay | Admitting: Internal Medicine

## 2015-03-15 VITALS — BP 112/76 | HR 80 | Temp 97.4°F | Resp 16 | Ht 65.0 in | Wt 139.7 lb

## 2015-03-15 DIAGNOSIS — E039 Hypothyroidism, unspecified: Secondary | ICD-10-CM

## 2015-03-15 DIAGNOSIS — Z111 Encounter for screening for respiratory tuberculosis: Secondary | ICD-10-CM

## 2015-03-15 DIAGNOSIS — I10 Essential (primary) hypertension: Secondary | ICD-10-CM

## 2015-03-15 DIAGNOSIS — Z0000 Encounter for general adult medical examination without abnormal findings: Secondary | ICD-10-CM

## 2015-03-15 DIAGNOSIS — Z0001 Encounter for general adult medical examination with abnormal findings: Secondary | ICD-10-CM

## 2015-03-15 DIAGNOSIS — D509 Iron deficiency anemia, unspecified: Secondary | ICD-10-CM

## 2015-03-15 DIAGNOSIS — E782 Mixed hyperlipidemia: Secondary | ICD-10-CM

## 2015-03-15 DIAGNOSIS — R5383 Other fatigue: Secondary | ICD-10-CM

## 2015-03-15 DIAGNOSIS — E559 Vitamin D deficiency, unspecified: Secondary | ICD-10-CM

## 2015-03-15 DIAGNOSIS — Z79899 Other long term (current) drug therapy: Secondary | ICD-10-CM

## 2015-03-15 DIAGNOSIS — E119 Type 2 diabetes mellitus without complications: Secondary | ICD-10-CM

## 2015-03-15 DIAGNOSIS — Z1212 Encounter for screening for malignant neoplasm of rectum: Secondary | ICD-10-CM

## 2015-03-15 LAB — LIPID PANEL
CHOL/HDL RATIO: 2.8 ratio (ref <=5.0)
CHOLESTEROL: 153 mg/dL (ref 125–200)
HDL: 55 mg/dL (ref >=46)
LDL Cholesterol: 50 mg/dL (ref <130)
Triglycerides: 241 mg/dL — ABNORMAL HIGH (ref <150)
VLDL: 48 mg/dL — ABNORMAL HIGH (ref <30)

## 2015-03-15 LAB — CBC WITH DIFFERENTIAL/PLATELET
BASOS PCT: 1 % (ref 0–1)
Basophils Absolute: 0.1 10*3/uL (ref 0.0–0.1)
EOS ABS: 0.5 10*3/uL (ref 0.0–0.7)
EOS PCT: 4 % (ref 0–5)
HCT: 43.5 % (ref 36.0–46.0)
HEMOGLOBIN: 14.9 g/dL (ref 12.0–15.0)
Lymphocytes Relative: 16 % (ref 12–46)
Lymphs Abs: 2.1 10*3/uL (ref 0.7–4.0)
MCH: 28.8 pg (ref 26.0–34.0)
MCHC: 34.3 g/dL (ref 30.0–36.0)
MCV: 84 fL (ref 78.0–100.0)
MONO ABS: 0.8 10*3/uL (ref 0.1–1.0)
MONOS PCT: 6 % (ref 3–12)
MPV: 9.8 fL (ref 8.6–12.4)
Neutro Abs: 9.7 10*3/uL — ABNORMAL HIGH (ref 1.7–7.7)
Neutrophils Relative %: 73 % (ref 43–77)
PLATELETS: 356 10*3/uL (ref 150–400)
RBC: 5.18 MIL/uL — ABNORMAL HIGH (ref 3.87–5.11)
RDW: 13.3 % (ref 11.5–15.5)
WBC: 13.3 10*3/uL — ABNORMAL HIGH (ref 4.0–10.5)

## 2015-03-15 LAB — BASIC METABOLIC PANEL WITH GFR
BUN: 28 mg/dL — ABNORMAL HIGH (ref 7–25)
CALCIUM: 9.9 mg/dL (ref 8.6–10.4)
CO2: 26 mmol/L (ref 20–31)
CREATININE: 0.7 mg/dL (ref 0.50–1.05)
Chloride: 102 mmol/L (ref 98–110)
GFR, Est African American: >89 mL/min (ref >=60)
GFR, Est Non African American: >89 mL/min (ref >=60)
Glucose, Bld: 128 mg/dL — ABNORMAL HIGH (ref 65–99)
Potassium: 4.8 mmol/L (ref 3.5–5.3)
SODIUM: 139 mmol/L (ref 135–146)

## 2015-03-15 LAB — HEPATIC FUNCTION PANEL
ALT: 18 U/L (ref 6–29)
AST: 13 U/L (ref 10–35)
Albumin: 4 g/dL (ref 3.6–5.1)
Alkaline Phosphatase: 87 U/L (ref 33–130)
BILIRUBIN DIRECT: 0.1 mg/dL (ref <=0.2)
Indirect Bilirubin: 0.2 mg/dL (ref 0.2–1.2)
Total Bilirubin: 0.3 mg/dL (ref 0.2–1.2)
Total Protein: 6.3 g/dL (ref 6.1–8.1)

## 2015-03-15 LAB — IRON AND TIBC
%SAT: 17 % (ref 11–50)
IRON: 58 ug/dL (ref 45–160)
TIBC: 332 ug/dL (ref 250–450)
UIBC: 274 ug/dL (ref 125–400)

## 2015-03-15 LAB — MAGNESIUM: MAGNESIUM: 2.2 mg/dL (ref 1.5–2.5)

## 2015-03-15 NOTE — Patient Instructions (Signed)
Recommend Adult Low Dose Aspirin or   coated  Aspirin 81 mg daily   To reduce risk of Colon Cancer 20 %,   Skin Cancer 26 % ,   Melanoma 46%   and   Pancreatic cancer 60%   ++++++++++++++++++++++++++++++++++++++++++++++++++++++  Vitamin D goal   is between 70-100.   Please make sure that you are taking your Vitamin D as directed.   It is very important as a natural anti-inflammatory   helping hair, skin, and nails, as well as reducing stroke and heart attack risk.   It helps your bones and helps with mood.  It also decreases numerous cancer risks so please take it as directed.   Low Vit D is associated with a 200-300% higher risk for CANCER   and 200-300% higher risk for HEART   ATTACK  &  STROKE.   ......................................  It is also associated with higher death rate at younger ages,   autoimmune diseases like Rheumatoid arthritis, Lupus, Multiple Sclerosis.     Also many other serious conditions, like depression, Alzheimer's  Dementia, infertility, muscle aches, fatigue, fibromyalgia - just to name a few.  ++++++++++++++++++++++++++++++++++++++++++++++++  Recommend the book "The END of DIETING" by Dr Joel Fuhrman   & the book "The END of DIABETES " by Dr Joel Fuhrman  At [employer] - get book & Audio CD's     Being diabetic has a  300% increased risk for heart attack, stroke, cancer, and alzheimer- type vascular dementia. It is very important that you work harder with diet by avoiding all foods that are white. Avoid white rice (brown & wild rice is OK), white potatoes (sweetpotatoes in moderation is OK), White bread or wheat bread or anything made out of white flour like bagels, donuts, rolls, buns, biscuits, cakes, pastries, cookies, pizza crust, and pasta (made from white flour & egg whites) - vegetarian pasta or spinach or wheat pasta is OK. Multigrain breads like Arnold's or [employer], or multigrain sandwich thins or flatbreads.  Diet,  exercise and weight loss can reverse and cure diabetes in the early stages.  Diet, exercise and weight loss is very important in the control and prevention of complications of diabetes which affects every system in your body, ie. Brain - dementia/stroke, eyes - glaucoma/blindness, heart - heart attack/heart failure, kidneys - dialysis, stomach - gastric paralysis, intestines - malabsorption, nerves - severe painful neuritis, circulation - gangrene & loss of a leg(s), and finally cancer and Alzheimers.    I recommend avoid fried & greasy foods,  sweets/candy, white rice (brown or wild rice or Quinoa is OK), white potatoes (sweet potatoes are OK) - anything made from white flour - bagels, doughnuts, rolls, buns, biscuits,white and wheat breads, pizza crust and traditional pasta made of white flour & egg white(vegetarian pasta or spinach or wheat pasta is OK).  Multi-grain bread is OK - like multi-grain flat bread or sandwich thins. Avoid alcohol in excess. Exercise is also important.    Eat all the vegetables you want - avoid meat, especially red meat and dairy - especially cheese.  Cheese is the most concentrated form of trans-fats which is the worst thing to clog up our arteries. Veggie cheese is OK which can be found in the fresh produce section at [employer] or [employer] or [employer]  ++++++++++++++++++++++++++++++++++++++++++++++++++ DASH Eating Plan  DASH stands for "Dietary Approaches to Stop Hypertension."   The DASH eating plan is a healthy eating plan that has been shown to reduce high   blood pressure (hypertension). Additional health benefits may include reducing the risk of type 2 diabetes mellitus, heart disease, and stroke. The DASH eating plan may also help with weight loss.  WHAT DO I NEED TO KNOW ABOUT THE DASH EATING PLAN? For the DASH eating plan, you will follow these general guidelines:  Choose foods with a percent daily value for sodium of less than 5% (as listed on the food  label).  Use salt-free seasonings or herbs instead of table salt or sea salt.  Check with your health care provider or pharmacist before using salt substitutes.  Eat lower-sodium products, often labeled as "lower sodium" or "no salt added."  Eat fresh foods.  Eat more vegetables, fruits, and low-fat dairy products.    Choose whole grains. Look for the word "whole" as the first word in the ingredient list.  Choose fish   Limit sweets, desserts, sugars, and sugary drinks.  Choose heart-healthy fats.  Eat veggie cheese   Eat more home-cooked food and less restaurant, buffet, and fast food.  Limit fried foods.  Cook foods using methods other than frying.  Limit canned vegetables. If you do use them, rinse them well to decrease the sodium.  When eating at a restaurant, ask that your food be prepared with less salt, or no salt if possible.                      WHAT FOODS CAN I EAT?  Seek help from a dietitian for individual calorie needs.  Grains Whole grain or whole wheat bread. Brown rice. Whole grain or whole wheat pasta. Quinoa, bulgur, and whole grain cereals. Low-sodium cereals. Corn or whole wheat flour tortillas. Whole grain cornbread. Whole grain crackers. Low-sodium crackers.  Vegetables Fresh or frozen vegetables (raw, steamed, roasted, or grilled). Low-sodium or reduced-sodium tomato and vegetable juices. Low-sodium or reduced-sodium tomato sauce and paste. Low-sodium or reduced-sodium canned vegetables.   Fruits All fresh, canned (in natural juice), or frozen fruits.  Protein Products  All fish and seafood.  Dried beans, peas, or lentils. Unsalted nuts and seeds. Unsalted canned beans.  Dairy Low-fat dairy products, such as skim or 1% milk, 2% or reduced-fat cheeses, low-fat ricotta or cottage cheese, or plain low-fat yogurt. Low-sodium or reduced-sodium cheeses.  Fats and Oils Tub margarines without trans fats. Light or reduced-fat mayonnaise and salad  dressings (reduced sodium). Avocado. Safflower, olive, or canola oils. Natural peanut or almond butter.  Other Unsalted popcorn and pretzels. The items listed above may not be a complete list of recommended foods or beverages. Contact your dietitian for more options.  +++++++++++++++++++++++++++++++++++++++++++  WHAT FOODS ARE NOT RECOMMENDED?  Grains/ White flour or wheat flour  White bread. White pasta. White rice. Refined cornbread. Bagels and croissants. Crackers that contain trans fat.  Vegetables  Creamed or fried vegetables. Vegetables in a . Regular canned vegetables. Regular canned tomato sauce and paste. Regular tomato and vegetable juices.  Fruits Dried fruits. Canned fruit in light or heavy syrup. Fruit juice.  Meat and Other Protein Products Meat in general. Fatty cuts of meat. Ribs, chicken wings, bacon, sausage, bologna, salami, chitterlings, fatback, hot dogs, bratwurst, and packaged luncheon meats.  Dairy Whole or 2% milk, cream, half-and-half, and cream cheese. Whole-fat or sweetened yogurt. Full-fat cheeses or blue cheese. Nondairy creamers and whipped toppings. Processed cheese, cheese spreads, or cheese curds.  Condiments Onion and garlic salt, seasoned salt, table salt, and sea salt. Canned and packaged gravies. Worcestershire sauce. Tartar sauce.  Barbecue sauce. Teriyaki sauce. Soy sauce, including reduced sodium. Steak sauce. Fish sauce. Oyster sauce. Cocktail sauce. Horseradish. Ketchup and mustard. Meat flavorings and tenderizers. Bouillon cubes. Hot sauce. Tabasco sauce. Marinades. Taco seasonings. Relishes.  Fats and Oils Butter, stick margarine, lard, shortening, ghee, and bacon fat. Coconut, palm kernel, or palm oils. Regular salad dressings.  Pickles and olives. Salted popcorn and pretzels. The items listed above may not be a complete list of foods and beverages to avoid.   Preventive Care for Adults  A healthy lifestyle and preventive care can  promote health and wellness. Preventive health guidelines for women include the following key practices.  A routine yearly physical is a good way to check with your health care provider about your health and preventive screening. It is a chance to share any concerns and updates on your health and to receive a thorough exam.  Visit your dentist for a routine exam and preventive care every 6 months. Brush your teeth twice a day and floss once a day. Good oral hygiene prevents tooth decay and gum disease.  The frequency of eye exams is based on your age, health, family medical history, use of contact lenses, and other factors. Follow your health care provider's recommendations for frequency of eye exams.  Eat a healthy diet. Foods like vegetables, fruits, whole grains, low-fat dairy products, and lean protein foods contain the nutrients you need without too many calories. Decrease your intake of foods high in solid fats, added sugars, and salt. Eat the right amount of calories for you.Get information about a proper diet from your health care provider, if necessary.  Regular physical exercise is one of the most important things you can do for your health. Most adults should get at least 150 minutes of moderate-intensity exercise (any activity that increases your heart rate and causes you to sweat) each week. In addition, most adults need muscle-strengthening exercises on 2 or more days a week.  Maintain a healthy weight. The body mass index (BMI) is a screening tool to identify possible weight problems. It provides an estimate of body fat based on height and weight. Your health care provider can find your BMI and can help you achieve or maintain a healthy weight.For adults 20 years and older:  A BMI below 18.5 is considered underweight.  A BMI of 18.5 to 24.9 is normal.  A BMI of 25 to 29.9 is considered overweight.  A BMI of 30 and above is considered obese.  Maintain normal blood lipids and  cholesterol levels by exercising and minimizing your intake of saturated fat. Eat a balanced diet with plenty of fruit and vegetables. Blood tests for lipids and cholesterol should begin at age 31 and be repeated every 5 years. If your lipid or cholesterol levels are high, you are over 50, or you are at high risk for heart disease, you may need your cholesterol levels checked more frequently.Ongoing high lipid and cholesterol levels should be treated with medicines if diet and exercise are not working.  If you smoke, find out from your health care provider how to quit. If you do not use tobacco, do not start.  Lung cancer screening is recommended for adults aged 83-80 years who are at high risk for developing lung cancer because of a history of smoking. A yearly low-dose CT scan of the lungs is recommended for people who have at least a 30-pack-year history of smoking and are a current smoker or have quit within the past 15 years.  A pack year of smoking is smoking an average of 1 pack of cigarettes a day for 1 year (for example: 1 pack a day for 30 years or 2 packs a day for 15 years). Yearly screening should continue until the smoker has stopped smoking for at least 15 years. Yearly screening should be stopped for people who develop a health problem that would prevent them from having lung cancer treatment.  High blood pressure causes heart disease and increases the risk of stroke. Your blood pressure should be checked at least every 1 to 2 years. Ongoing high blood pressure should be treated with medicines if weight loss and exercise do not work.  If you are 61-30 years old, ask your health care provider if you should take aspirin to prevent strokes.  Diabetes screening involves taking a blood sample to check your fasting blood sugar level. This should be done once every 3 years, after age 9, if you are within normal weight and without risk factors for diabetes. Testing should be considered at a  younger age or be carried out more frequently if you are overweight and have at least 1 risk factor for diabetes.  Breast cancer screening is essential preventive care for women. You should practice "breast self-awareness." This means understanding the normal appearance and feel of your breasts and may include breast self-examination. Any changes detected, no matter how small, should be reported to a health care provider. Women in their 74s and 30s should have a clinical breast exam (CBE) by a health care provider as part of a regular health exam every 1 to 3 years. After age 75, women should have a CBE every year. Starting at age 19, women should consider having a mammogram (breast X-ray test) every year. Women who have a family history of breast cancer should talk to their health care provider about genetic screening. Women at a high risk of breast cancer should talk to their health care providers about having an MRI and a mammogram every year.  Breast cancer gene (BRCA)-related cancer risk assessment is recommended for women who have family members with BRCA-related cancers. BRCA-related cancers include breast, ovarian, tubal, and peritoneal cancers. Having family members with these cancers may be associated with an increased risk for harmful changes (mutations) in the breast cancer genes BRCA1 and BRCA2. Results of the assessment will determine the need for genetic counseling and BRCA1 and BRCA2 testing.  Routine pelvic exams to screen for cancer are no longer recommended for nonpregnant women who are considered low risk for cancer of the pelvic organs (ovaries, uterus, and vagina) and who do not have symptoms. Ask your health care provider if a screening pelvic exam is right for you.  If you have had past treatment for cervical cancer or a condition that could lead to cancer, you need Pap tests and screening for cancer for at least 20 years after your treatment. If Pap tests have been discontinued, your  risk factors (such as having a new sexual partner) need to be reassessed to determine if screening should be resumed. Some women have medical problems that increase the chance of getting cervical cancer. In these cases, your health care provider may recommend more frequent screening and Pap tests.  Colorectal cancer can be detected and often prevented. Most routine colorectal cancer screening begins at the age of 23 years and continues through age 42 years. However, your health care provider may recommend screening at an earlier age if you have risk factors for colon  cancer. On a yearly basis, your health care provider may provide home test kits to check for hidden blood in the stool. Use of a small camera at the end of a tube, to directly examine the colon (sigmoidoscopy or colonoscopy), can detect the earliest forms of colorectal cancer. Talk to your health care provider about this at age 50, when routine screening begins. Direct exam of the colon should be repeated every 5-10 years through age 75 years, unless early forms of pre-cancerous polyps or small growths are found.  Hepatitis C blood testing is recommended for all people born from 1945 through 1965 and any individual with known risks for hepatitis C.  Pra  Osteoporosis is a disease in which the bones lose minerals and strength with aging. This can result in serious bone fractures or breaks. The risk of osteoporosis can be identified using a bone density scan. Women ages 65 years and over and women at risk for fractures or osteoporosis should discuss screening with their health care providers. Ask your health care provider whether you should take a calcium supplement or vitamin D to reduce the rate of osteoporosis.  Menopause can be associated with physical symptoms and risks. Hormone replacement therapy is available to decrease symptoms and risks. You should talk to your health care provider about whether hormone replacement therapy is right  for you.  Use sunscreen. Apply sunscreen liberally and repeatedly throughout the day. You should seek shade when your shadow is shorter than you. Protect yourself by wearing long sleeves, pants, a wide-brimmed hat, and sunglasses year round, whenever you are outdoors.  Once a month, do a whole body skin exam, using a mirror to look at the skin on your back. Tell your health care provider of new moles, moles that have irregular borders, moles that are larger than a pencil eraser, or moles that have changed in shape or color.  Stay current with required vaccines (immunizations).  Influenza vaccine. All adults should be immunized every year.  Tetanus, diphtheria, and acellular pertussis (Td, Tdap) vaccine. Pregnant women should receive 1 dose of Tdap vaccine during each pregnancy. The dose should be obtained regardless of the length of time since the last dose. Immunization is preferred during the 27th-36th week of gestation. An adult who has not previously received Tdap or who does not know her vaccine status should receive 1 dose of Tdap. This initial dose should be followed by tetanus and diphtheria toxoids (Td) booster doses every 10 years. Adults with an unknown or incomplete history of completing a 3-dose immunization series with Td-containing vaccines should begin or complete a primary immunization series including a Tdap dose. Adults should receive a Td booster every 10 years.  Varicella vaccine. An adult without evidence of immunity to varicella should receive 2 doses or a second dose if she has previously received 1 dose. Pregnant females who do not have evidence of immunity should receive the first dose after pregnancy. This first dose should be obtained before leaving the health care facility. The second dose should be obtained 4-8 weeks after the first dose.  Human papillomavirus (HPV) vaccine. Females aged 13-26 years who have not received the vaccine previously should obtain the 3-dose  series. The vaccine is not recommended for use in pregnant females. However, pregnancy testing is not needed before receiving a dose. If a female is found to be pregnant after receiving a dose, no treatment is needed. In that case, the remaining doses should be delayed until after the pregnancy. Immunization   is recommended for any person with an immunocompromised condition through the age of 26 years if she did not get any or all doses earlier. During the 3-dose series, the second dose should be obtained 4-8 weeks after the first dose. The third dose should be obtained 24 weeks after the first dose and 16 weeks after the second dose.  Zoster vaccine. One dose is recommended for adults aged 60 years or older unless certain conditions are present.  Measles, mumps, and rubella (MMR) vaccine. Adults born before 1957 generally are considered immune to measles and mumps. Adults born in 1957 or later should have 1 or more doses of MMR vaccine unless there is a contraindication to the vaccine or there is laboratory evidence of immunity to each of the three diseases. A routine second dose of MMR vaccine should be obtained at least 28 days after the first dose for students attending postsecondary schools, health care workers, or international travelers. People who received inactivated measles vaccine or an unknown type of measles vaccine during 1963-1967 should receive 2 doses of MMR vaccine. People who received inactivated mumps vaccine or an unknown type of mumps vaccine before 1979 and are at high risk for mumps infection should consider immunization with 2 doses of MMR vaccine. For females of childbearing age, rubella immunity should be determined. If there is no evidence of immunity, females who are not pregnant should be vaccinated. If there is no evidence of immunity, females who are pregnant should delay immunization until after pregnancy. Unvaccinated health care workers born before 1957 who lack laboratory  evidence of measles, mumps, or rubella immunity or laboratory confirmation of disease should consider measles and mumps immunization with 2 doses of MMR vaccine or rubella immunization with 1 dose of MMR vaccine.  Pneumococcal 13-valent conjugate (PCV13) vaccine. When indicated, a person who is uncertain of her immunization history and has no record of immunization should receive the PCV13 vaccine. An adult aged 19 years or older who has certain medical conditions and has not been previously immunized should receive 1 dose of PCV13 vaccine. This PCV13 should be followed with a dose of pneumococcal polysaccharide (PPSV23) vaccine. The PPSV23 vaccine dose should be obtained at least 8 weeks after the dose of PCV13 vaccine. An adult aged 19 years or older who has certain medical conditions and previously received 1 or more doses of PPSV23 vaccine should receive 1 dose of PCV13. The PCV13 vaccine dose should be obtained 1 or more years after the last PPSV23 vaccine dose.    Pneumococcal polysaccharide (PPSV23) vaccine. When PCV13 is also indicated, PCV13 should be obtained first. All adults aged 65 years and older should be immunized. An adult younger than age 65 years who has certain medical conditions should be immunized. Any person who resides in a nursing home or long-term care facility should be immunized. An adult smoker should be immunized. People with an immunocompromised condition and certain other conditions should receive both PCV13 and PPSV23 vaccines. People with human immunodeficiency virus (HIV) infection should be immunized as soon as possible after diagnosis. Immunization during chemotherapy or radiation therapy should be avoided. Routine use of PPSV23 vaccine is not recommended for American Indians, [place] Natives, or people younger than 65 years unless there are medical conditions that require PPSV23 vaccine. When indicated, people who have unknown immunization and have no record of immunization  should receive PPSV23 vaccine. One-time revaccination 5 years after the first dose of PPSV23 is recommended for people aged 19-64 years who have   chronic kidney failure, nephrotic syndrome, asplenia, or immunocompromised conditions. People who received 1-2 doses of PPSV23 before age 28 years should receive another dose of PPSV23 vaccine at age 64 years or later if at least 5 years have passed since the previous dose. Doses of PPSV23 are not needed for people immunized with PPSV23 at or after age 65 years.  Preventive Services / Frequency   Ages 52 to 76 years  Blood pressure check.  Lipid and cholesterol check.  Lung cancer screening. / Every year if you are aged 62-80 years and have a 30-pack-year history of smoking and currently smoke or have quit within the past 15 years. Yearly screening is stopped once you have quit smoking for at least 15 years or develop a health problem that would prevent you from having lung cancer treatment.  Clinical breast exam.** / Every year after age 38 years.  BRCA-related cancer risk assessment.** / For women who have family members with a BRCA-related cancer (breast, ovarian, tubal, or peritoneal cancers).  Mammogram.** / Every year beginning at age 69 years and continuing for as long as you are in good health. Consult with your health care provider.  Pap test.** / Every 3 years starting at age 29 years through age 47 or 54 years with a history of 3 consecutive normal Pap tests.  HPV screening.** / Every 3 years from ages 1 years through ages 108 to 42 years with a history of 3 consecutive normal Pap tests.  Fecal occult blood test (FOBT) of stool. / Every year beginning at age 36 years and continuing until age 71 years. You may not need to do this test if you get a colonoscopy every 10 years.  Flexible sigmoidoscopy or colonoscopy.** / Every 5 years for a flexible sigmoidoscopy or every 10 years for a colonoscopy beginning at age 65 years and continuing  until age 21 years.  Hepatitis C blood test.** / For all people born from [birth year] through 1965 and any individual with known risks for hepatitis C.  Skin self-exam. / Monthly.  Influenza vaccine. / Every year.  Tetanus, diphtheria, and acellular pertussis (Tdap/Td) vaccine.** / Consult your health care provider. Pregnant women should receive 1 dose of Tdap vaccine during each pregnancy. 1 dose of Td every 10 years.  Varicella vaccine.** / Consult your health care provider. Pregnant females who do not have evidence of immunity should receive the first dose after pregnancy.  Zoster vaccine.** / 1 dose for adults aged 69 years or older.  Pneumococcal 13-valent conjugate (PCV13) vaccine.** / Consult your health care provider.  Pneumococcal polysaccharide (PPSV23) vaccine.** / 1 to 2 doses if you smoke cigarettes or if you have certain conditions.  Meningococcal vaccine.** / Consult your health care provider.  Hepatitis A vaccine.** / Consult your health care provider.  Hepatitis B vaccine.** / Consult your health care provider. Screening for abdominal aortic aneurysm (AAA)  by ultrasound is recommended for people over 50 who have history of high blood pressure or who are current or former smokers.

## 2015-03-15 NOTE — Progress Notes (Signed)
Patient ID: Mary Frank, female   DOB: June 13, 1955, 60 y.o.   MRN: [id number]  Annual Screening/Preventative Visit And Comprehensive Evaluation &  Examination  This very nice 60 y.o. MWF presents for presents for a Wellness/Preventative Visit & comprehensive evaluation and management of multiple medical co-morbidities.  Patient has been followed for HTN, T2_NIDDM  , Hyperlipidemia and Vitamin D Deficiency.   Labile HTN predates since 2004 and is followed expectantly. Patient's BP has been controlled at home and patient denies any cardiac symptoms as chest pain, palpitations, shortness of breath, dizziness or ankle swelling. Today's BP: 112/76 mmHg    Patient's hyperlipidemia is controlled with diet and medications. Patient denies myalgias or other medication SE's. Last lipids were at goal with Cholesterol 146; HDL 62; LDL 59; Triglycerides 125 on 12/08/2014.    Patient has T2_NIDDM since 2003 but only started Tx in Feb 2014 with Metformin and patient denies reactive hypoglycemic symptoms, visual blurring, diabetic polys, or paresthesias. Last A1c was  7.2% on 12/08/2014.   Finally, patient has history of Vitamin D Deficiency of 25 in 2008 and last Vitamin D was 75 on 12/08/2014.  Medication Sig  . aspirin EC 81 MG tablet Take 1 tablet (81 mg total) by mouth daily.  Marland Kitchen atorvastatin  80 MG tablet TAKE 1 TABLET BY MOUTH DAILY  . VITAMIN D  Take 6,000 Units by mouth daily.   Marland Kitchen CINNAMON PO Take by mouth daily.  . dapagliflozin (FARXIGA) 10 MG TABS tablet Take 1/2 to 1 tablet daily for diabetes  . Estradiol 2 MG tablet Take 2 mg by mouth daily.   Marland Kitchen HEMOCYTE - 106 MG FE 325 MG  Take 1 tablet by mouth daily.  Marland Kitchen levothyroxine  200 MCG tablet Takes 1/2 tab on M,W,F and 1 tab the other 4 days  . lisinopril  10 MG tablet TAKE 1/2-1 TABLET BY MOUTH EVERYDAY  . Magnesium 500 MG CAPS Take 500 mg by mouth daily.  Marland Kitchen PROVERA 5 MG tablet Take 1 tablet 10 days every 3 months  . Takes 1-1-2 tabs pc meals   . Omega-3  FISH OIL 1200 MG Take 1 capsule (1,200 mg total) by mouth 2 (two) times daily.  . Biotin Takes  1 daily.   No Known Allergies   Past Medical History  Diagnosis Date  . Iron deficiency anemia 01/17/2011  . Hypertension   . Hyperlipidemia   . Diabetes mellitus without complication ([place])   . Thyroid disease   . Vitamin D deficiency   . Type II or unspecified type diabetes mellitus without mention of complication, not stated as uncontrolled 02/04/2013  . Varicose veins    Health Maintenance  Topic Date Due  . FOOT EXAM  02/09/2015  . HEMOGLOBIN A1C  06/08/2015  . OPHTHALMOLOGY EXAM  06/21/2015  . INFLUENZA VACCINE  10/03/2015  . MAMMOGRAM  01/13/2016  . TETANUS/TDAP  03/04/2016  . PAP SMEAR  01/02/2017  . COLONOSCOPY  12/14/2018  . PNEUMOCOCCAL POLYSACCHARIDE VACCINE (2) 12/08/2019  . Hepatitis C Screening  Completed  . HIV Screening  Completed   Immunization History  Administered Date(s) Administered  . Influenza Split 12/06/2013, 12/08/2014  . Influenza Whole 01/28/2012  . PPD Test 02/08/2014  . Pneumococcal Polysaccharide-23 12/08/2014  . Tdap 03/04/2006   Past Surgical History  Procedure Laterality Date  . Vein ligation and stripping Left    Family History  Problem Relation Age of Onset  . Diabetes Mother   . Thyroid disease Mother   .  Diabetes Father   . Osteoporosis Father   . Thyroid disease Sister    Social History  Substance Use Topics  . Smoking status: Former Smoker -- 1.00 packs/day    Quit date: 03/20/2010  . Smokeless tobacco: Never Used  . Alcohol Use: 0.0 oz/week    0 Standard drinks or equivalent per week     Comment: ocassional    ROS Constitutional: Denies fever, chills, weight loss/gain, headaches, insomnia,  night sweats, and change in appetite. Does c/o fatigue. Eyes: Denies redness, blurred vision, diplopia, discharge, itchy, watery eyes.  ENT: Denies discharge, congestion, post nasal drip, epistaxis, sore throat, earache, hearing loss,  dental pain, Tinnitus, Vertigo, Sinus pain, snoring.  Cardio: Denies chest pain, palpitations, irregular heartbeat, syncope, dyspnea, diaphoresis, orthopnea, PND, claudication, edema Respiratory: denies cough, dyspnea, DOE, pleurisy, hoarseness, laryngitis, wheezing.  Gastrointestinal: Denies dysphagia, heartburn, reflux, water brash, pain, cramps, nausea, vomiting, bloating, diarrhea, constipation, hematemesis, melena, hematochezia, jaundice, hemorrhoids Genitourinary: Denies dysuria, frequency, urgency, nocturia, hesitancy, discharge, hematuria, flank pain Breast: Breast lumps, nipple discharge, bleeding.  Musculoskeletal: Denies arthralgia, myalgia, stiffness, Jt. Swelling, pain, limp, and strain/sprain. Denies falls. Skin: Denies puritis, rash, hives, warts, acne, eczema, changing in skin lesion Neuro: No weakness, tremor, incoordination, spasms, paresthesia, pain Psychiatric: Denies confusion, memory loss, sensory loss. Denies Depression. Endocrine: Denies change in weight, skin, hair change, nocturia, and paresthesia, diabetic polys, visual blurring, hyper / hypo glycemic episodes.  Heme/Lymph: No excessive bleeding, bruising, enlarged lymph nodes.  Physical Exam  BP 112/76  Pulse 80  Temp 97.4 F   Resp 16  Ht 5\' 5"    Wt 139 lb 11.2 oz   BMI 23.25   General Appearance: Well nourished and in no apparent distress. Eyes: PERRLA, EOMs, conjunctiva no swelling or erythema, normal fundi and vessels. Sinuses: No frontal/maxillary tenderness ENT/Mouth: EACs patent / TMs  nl. Nares clear without erythema, swelling, mucoid exudates. Oral hygiene is good. No erythema, swelling, or exudate. Tongue normal, non-obstructing. Tonsils not swollen or erythematous. Hearing normal.  Neck: Supple, thyroid normal. No bruits, nodes or JVD. Respiratory: Respiratory effort normal.  BS equal and clear bilateral without rales, rhonci, wheezing or stridor. Cardio: Heart sounds are normal with regular rate  and rhythm and no murmurs, rubs or gallops. Peripheral pulses are normal and equal bilaterally without edema. No aortic or femoral bruits. Chest: symmetric with normal excursions and percussion. Breasts: Symmetric, without lumps, nipple discharge, retractions, or fibrocystic changes.  Abdomen: Flat, soft, with bowl sounds. Nontender, no guarding, rebound, hernias, masses, or organomegaly.  Lymphatics: Non tender without lymphadenopathy.  Genitourinary:  Musculoskeletal: Full ROM all peripheral extremities, joint stability, 5/5 strength, and normal gait. Skin: Warm and dry without rashes, lesions, cyanosis, clubbing or  ecchymosis.  Neuro: Cranial nerves intact, reflexes equal bilaterally. Normal muscle tone, no cerebellar symptoms. Sensation intact bilaterally to the toes by touch, vibratory & Monofilament testing.  Pysch: Alert and oriented X 3, normal affect, Insight and Judgment appropriate.   Assessment and Plan  1. Annual Preventative Screening Examination  - Microalbumin / creatinine urine ratio - EKG 12-Lead - [place], RETROPERITNL ABD,  LTD - POC Hemoccult Bld/Stl  - Urinalysis, Routine w reflex microscopic  - Vitamin B12 - Iron and TIBC - CBC with Differential/Platelet - BASIC METABOLIC PANEL WITH GFR - Hepatic function panel - Magnesium - Lipid panel - TSH - Hemoglobin A1c - Insulin, random - VITAMIN D 25 Hydroxy   2. Essential hypertension  - Microalbumin / creatinine urine ratio - EKG 12-Lead -  [place], RETROPERITNL ABD,  LTD - TSH  3. Mixed hyperlipidemia  - Lipid panel - TSH  4. T2_NIDDM  - Hemoglobin A1c - Insulin, random  5. Vitamin D deficiency  - VITAMIN D 25 Hydroxy   6. Hypothyroidism   7. Iron deficiency anemia  - Iron and TIBC - CBC with Differential/Platelet  8. Medication management  - Urinalysis, Routine w reflex microscopic  - CBC with Differential/Platelet - BASIC METABOLIC PANEL WITH GFR - Hepatic function panel - Magnesium  9.  Screening for rectal cancer - POC Hemoccult Bld/Stl   10. Other fatigue  - Vitamin B12 - Iron and TIBC - TSH  11. Screening examination for pulmonary tuberculosis  - PPD   Continue prudent diet as discussed, weight control, BP monitoring, regular exercise, and medications. Discussed med's effects and SE's. Screening labs and tests as requested with regular follow-up as recommended. Over 40 minutes of exam, counseling, chart review and high complex critical decision making was performed.

## 2015-03-16 LAB — URINALYSIS, ROUTINE W REFLEX MICROSCOPIC
Bilirubin Urine: NEGATIVE
Hgb urine dipstick: NEGATIVE
Ketones, ur: NEGATIVE
Leukocytes, UA: NEGATIVE
NITRITE: NEGATIVE
Protein, ur: NEGATIVE
SPECIFIC GRAVITY, URINE: 1.036 — AB (ref 1.001–1.035)
pH: 6 (ref 5.0–8.0)

## 2015-03-16 LAB — HEMOGLOBIN A1C
HEMOGLOBIN A1C: 7.5 % — AB (ref <5.7)
MEAN PLASMA GLUCOSE: 169 mg/dL — AB (ref <117)

## 2015-03-16 LAB — TSH: TSH: 0.349 u[IU]/mL — ABNORMAL LOW (ref 0.350–4.500)

## 2015-03-16 LAB — VITAMIN B12: VITAMIN B 12: 1047 pg/mL — AB (ref 211–911)

## 2015-03-16 LAB — URINALYSIS, MICROSCOPIC ONLY
Bacteria, UA: NONE SEEN [HPF]
CASTS: NONE SEEN [LPF]
RBC / HPF: NONE SEEN RBC/HPF (ref <=2)
Squamous Epithelial / LPF: NONE SEEN [HPF] (ref <=5)
WBC, UA: NONE SEEN WBC/HPF (ref <=5)
YEAST: NONE SEEN [HPF]

## 2015-03-16 LAB — MICROALBUMIN / CREATININE URINE RATIO
CREATININE, URINE: 54 mg/dL (ref 20–320)
Microalb, Ur: <0.2 mg/dL

## 2015-03-16 LAB — INSULIN, RANDOM: INSULIN: 3.6 u[IU]/mL (ref 2.0–19.6)

## 2015-03-16 LAB — VITAMIN D 25 HYDROXY (VIT D DEFICIENCY, FRACTURES): VIT D 25 HYDROXY: 71 ng/mL (ref 30–100)

## 2015-03-28 ENCOUNTER — Other Ambulatory Visit: Payer: Self-pay | Admitting: *Deleted

## 2015-03-30 ENCOUNTER — Other Ambulatory Visit: Payer: Self-pay | Admitting: Internal Medicine

## 2015-03-31 ENCOUNTER — Other Ambulatory Visit: Payer: Self-pay

## 2015-03-31 MED ORDER — LISINOPRIL 10 MG PO TABS: ORAL_TABLET | ORAL | Status: DC

## 2015-04-05 ENCOUNTER — Other Ambulatory Visit: Payer: Self-pay | Admitting: Internal Medicine

## 2015-04-05 DIAGNOSIS — E119 Type 2 diabetes mellitus without complications: Secondary | ICD-10-CM

## 2015-05-09 ENCOUNTER — Other Ambulatory Visit: Payer: Self-pay | Admitting: Internal Medicine

## 2015-05-26 ENCOUNTER — Other Ambulatory Visit: Payer: Self-pay | Admitting: Obstetrics & Gynecology

## 2015-05-26 DIAGNOSIS — R928 Other abnormal and inconclusive findings on diagnostic imaging of breast: Secondary | ICD-10-CM

## 2015-06-02 ENCOUNTER — Other Ambulatory Visit: Payer: Self-pay | Admitting: Obstetrics & Gynecology

## 2015-06-02 ENCOUNTER — Ambulatory Visit
Admission: RE | Admit: 2015-06-02 | Discharge: 2015-06-02 | Disposition: A | Discharge status: Home / Self Care | Payer: 59 | Source: Ambulatory Visit | Attending: Obstetrics & Gynecology | Admitting: Obstetrics & Gynecology

## 2015-06-02 DIAGNOSIS — R928 Other abnormal and inconclusive findings on diagnostic imaging of breast: Secondary | ICD-10-CM

## 2015-06-02 DIAGNOSIS — N6320 Unspecified lump in the left breast, unspecified quadrant: Secondary | ICD-10-CM

## 2015-06-06 ENCOUNTER — Other Ambulatory Visit: Payer: Self-pay | Admitting: Obstetrics & Gynecology

## 2015-06-06 DIAGNOSIS — N6320 Unspecified lump in the left breast, unspecified quadrant: Secondary | ICD-10-CM

## 2015-06-07 ENCOUNTER — Ambulatory Visit
Admission: RE | Admit: 2015-06-07 | Discharge: 2015-06-07 | Disposition: A | Discharge status: Home / Self Care | Payer: 59 | Source: Ambulatory Visit | Attending: Obstetrics & Gynecology | Admitting: Obstetrics & Gynecology

## 2015-06-07 DIAGNOSIS — N6320 Unspecified lump in the left breast, unspecified quadrant: Secondary | ICD-10-CM

## 2015-06-22 ENCOUNTER — Encounter: Payer: Self-pay | Admitting: Internal Medicine

## 2015-06-22 ENCOUNTER — Ambulatory Visit (INDEPENDENT_AMBULATORY_CARE_PROVIDER_SITE_OTHER): Payer: 59 | Admitting: Internal Medicine

## 2015-06-22 VITALS — BP 122/80 | HR 80 | Temp 98.0°F | Resp 18 | Ht 65.0 in | Wt 146.0 lb

## 2015-06-22 DIAGNOSIS — E782 Mixed hyperlipidemia: Secondary | ICD-10-CM | POA: Diagnosis not present

## 2015-06-22 DIAGNOSIS — E559 Vitamin D deficiency, unspecified: Secondary | ICD-10-CM

## 2015-06-22 DIAGNOSIS — E039 Hypothyroidism, unspecified: Secondary | ICD-10-CM

## 2015-06-22 DIAGNOSIS — I10 Essential (primary) hypertension: Secondary | ICD-10-CM

## 2015-06-22 DIAGNOSIS — Z79899 Other long term (current) drug therapy: Secondary | ICD-10-CM

## 2015-06-22 LAB — HEPATIC FUNCTION PANEL
ALBUMIN: 3.6 g/dL (ref 3.6–5.1)
ALK PHOS: 73 U/L (ref 33–130)
ALT: 20 U/L (ref 6–29)
AST: 15 U/L (ref 10–35)
BILIRUBIN INDIRECT: 0.3 mg/dL (ref 0.2–1.2)
Bilirubin, Direct: 0.1 mg/dL (ref <=0.2)
TOTAL PROTEIN: 5.9 g/dL — AB (ref 6.1–8.1)
Total Bilirubin: 0.4 mg/dL (ref 0.2–1.2)

## 2015-06-22 LAB — CBC WITH DIFFERENTIAL/PLATELET
BASOS PCT: 1 %
Basophils Absolute: 64 cells/uL (ref 0–200)
EOS ABS: 192 {cells}/uL (ref 15–500)
Eosinophils Relative: 3 %
HEMATOCRIT: 44.1 % (ref 35.0–45.0)
HEMOGLOBIN: 14.6 g/dL (ref 11.7–15.5)
LYMPHS ABS: 1728 {cells}/uL (ref 850–3900)
LYMPHS PCT: 27 %
MCH: 28 pg (ref 27.0–33.0)
MCHC: 33.1 g/dL (ref 32.0–36.0)
MCV: 84.5 fL (ref 80.0–100.0)
MONO ABS: 512 {cells}/uL (ref 200–950)
MPV: 10.1 fL (ref 7.5–12.5)
Monocytes Relative: 8 %
NEUTROS PCT: 61 %
Neutro Abs: 3904 cells/uL (ref 1500–7800)
Platelets: 257 10*3/uL (ref 140–400)
RBC: 5.22 MIL/uL — AB (ref 3.80–5.10)
RDW: 13.2 % (ref 11.0–15.0)
WBC: 6.4 10*3/uL (ref 3.8–10.8)

## 2015-06-22 LAB — BASIC METABOLIC PANEL WITH GFR
BUN: 17 mg/dL (ref 7–25)
CO2: 24 mmol/L (ref 20–31)
Calcium: 9.3 mg/dL (ref 8.6–10.4)
Chloride: 105 mmol/L (ref 98–110)
Creat: 0.68 mg/dL (ref 0.50–1.05)
GFR, Est African American: >89 mL/min (ref >=60)
GLUCOSE: 133 mg/dL — AB (ref 65–99)
POTASSIUM: 4.5 mmol/L (ref 3.5–5.3)
Sodium: 139 mmol/L (ref 135–146)

## 2015-06-22 LAB — LIPID PANEL
Cholesterol: 157 mg/dL (ref 125–200)
HDL: 78 mg/dL (ref >=46)
LDL CALC: 58 mg/dL (ref <130)
Total CHOL/HDL Ratio: 2 Ratio (ref <=5.0)
Triglycerides: 106 mg/dL (ref <150)
VLDL: 21 mg/dL (ref <30)

## 2015-06-22 LAB — TSH: TSH: 0.33 m[IU]/L — AB

## 2015-06-22 NOTE — Progress Notes (Signed)
Assessment and Plan:  Hypertension:  -Continue medication -monitor blood pressure at home. -Continue DASH diet -Reminder to go to the ER if any CP, SOB, nausea, dizziness, severe HA, changes vision/speech, left arm numbness and tingling and jaw pain.  Cholesterol - Continue diet and exercise -Check cholesterol.   Late Onset Adult Diabetes without complications  -seeing Dr. Loanne Drilling -started on long acting insulin and also novolin -Continue diet and exercise.  -Check A1C  Vitamin D Def -check level -continue medications.   Continue diet and meds as discussed. Further disposition pending results of labs. Discussed med's effects and SE's.    HPI 60 y.o. female  presents for 3 month follow up with hypertension, hyperlipidemia, diabetes and vitamin D deficiency.   Her blood pressure has been controlled at home, today their BP is BP: 122/80 mmHg.She does not workout. She denies chest pain, shortness of breath, dizziness.   She is on cholesterol medication and denies myalgias. Her cholesterol is at goal. The cholesterol was:  03/15/2015: Cholesterol 153; HDL 55; LDL Cholesterol 50; Triglycerides 241*   She has been working on diet and exercise for diabetes without complications, she is on bASA, she is on ACE/ARB, and denies  foot ulcerations, hyperglycemia, hypoglycemia , increased appetite, nausea, paresthesia of the feet, polydipsia, polyuria, visual disturbances, vomiting and weight loss. Last A1C was: 03/15/2015: Hgb A1c MFr Bld 7.5*.  She was recently found to have positive GAD65 antibody and was started on insulin.  She feels much better off oral medications.  They are still gathering more information.     Patient is on Vitamin D supplement. 03/15/2015: Vit D, 25-Hydroxy 71  She also had a recent breast biopsy for a suspicious spot on her mammogram.  They did put in a marker.  Biopsy was okay.   Current Medications:  Current Outpatient Prescriptions on File Prior to Visit   Medication Sig Dispense Refill  . aspirin EC 81 MG tablet Take 1 tablet (81 mg total) by mouth daily. 150 tablet 2  . atorvastatin (LIPITOR) 80 MG tablet TAKE 1 TABLET BY MOUTH ONCE DAILY 30 tablet 3  . cholecalciferol (VITAMIN D) 1000 UNITS tablet Take 6,000 Units by mouth daily.     Marland Kitchen estradiol (ESTRACE) 2 MG tablet Take 2 mg by mouth daily.     . ferrous fumarate (HEMOCYTE - 106 MG FE) 325 (106 FE) MG TABS Take 1 tablet by mouth daily.    Marland Kitchen levothyroxine (SYNTHROID, LEVOTHROID) 200 MCG tablet Take 1 tablet (200 mcg total) by mouth daily before breakfast. Takes 1 tab on M,W,F and 1/2 tab the other 4 days of the week (Patient taking differently: Take 200 mcg by mouth daily before breakfast. Takes 1/2 tab on M,W,F and 1 tab the other 4 days of the week) 90 tablet 1  . lisinopril (PRINIVIL,ZESTRIL) 10 MG tablet TAKE 1/2-1 TABLET BY MOUTH EVERYDAY 90 tablet 1  . Magnesium 500 MG CAPS Take 500 mg by mouth daily.    . medroxyPROGESTERone (PROVERA) 5 MG tablet Take 1 tablet 10 days every 3 months    . Omega-3 Fatty Acids (FISH OIL) 1200 MG CAPS Take 1 capsule (1,200 mg total) by mouth 2 (two) times daily.    Marland Kitchen OVER THE COUNTER MEDICATION Takes Biotin 1 daily.     No current facility-administered medications on file prior to visit.   Medical History:  Past Medical History  Diagnosis Date  . Iron deficiency anemia 01/17/2011  . Hypertension   . Hyperlipidemia   .  Diabetes mellitus without complication ([place])   . Thyroid disease   . Vitamin D deficiency   . Type II or unspecified type diabetes mellitus without mention of complication, not stated as uncontrolled 02/04/2013  . Varicose veins    Allergies: No Known Allergies   Review of Systems:  Review of Systems  Constitutional: Negative for fever, chills and malaise/fatigue.  HENT: Negative for congestion, ear pain and sore throat.   Eyes: Negative.   Respiratory: Negative for cough, shortness of breath and wheezing.   Cardiovascular:  Negative for chest pain, palpitations and leg swelling.  Gastrointestinal: Negative for heartburn, abdominal pain, diarrhea, constipation, blood in stool and melena.  Genitourinary: Negative.   Skin: Negative.   Neurological: Negative for dizziness, sensory change, loss of consciousness and headaches.  Psychiatric/Behavioral: Negative for depression. The patient is not nervous/anxious and does not have insomnia.     Family history- Review and unchanged  Social history- Review and unchanged  Physical Exam: BP 122/80 mmHg  Pulse 80  Temp(Src) 98 F (36.7 C) (Temporal)  Resp 18  Ht 5\' 5"  (1.651 m)  Wt 146 lb (66.225 kg)  BMI 24.30 kg/m2  LMP 08/11/2013 Wt Readings from Last 3 Encounters:  06/22/15 146 lb (66.225 kg)  03/15/15 139 lb 11.2 oz (63.368 kg)  12/08/14 139 lb (63.05 kg)   General Appearance: Well nourished well developed, non-toxic appearing, in no apparent distress. Eyes: PERRLA, EOMs, conjunctiva no swelling or erythema ENT/Mouth: Ear canals clear with no erythema, swelling, or discharge.  TMs normal bilaterally, oropharynx clear, moist, with no exudate.   Neck: Supple, thyroid normal, no JVD, no cervical adenopathy.  Respiratory: Respiratory effort normal, breath sounds clear A&P, no wheeze, rhonchi or rales noted.  No retractions, no accessory muscle usage Cardio: RRR with no MRGs. No noted edema.  Abdomen: Soft, + BS.  Non tender, no guarding, rebound, hernias, masses. Musculoskeletal: Full ROM, 5/5 strength, Normal gait Skin: Warm, dry without rashes, lesions, ecchymosis.  Neuro: Awake and oriented X 3, Cranial nerves intact. No cerebellar symptoms.  Psych: normal affect, Insight and Judgment appropriate.    Starlyn Skeans, PA-C 8:57 AM [facility]

## 2015-07-22 ENCOUNTER — Encounter: Payer: Self-pay | Admitting: *Deleted

## 2015-08-28 ENCOUNTER — Other Ambulatory Visit: Payer: Self-pay | Admitting: *Deleted

## 2015-08-28 MED ORDER — LEVOTHYROXINE SODIUM 200 MCG PO TABS: 200.0000 ug | ORAL_TABLET | Freq: Every day | ORAL | Status: DC

## 2015-09-21 ENCOUNTER — Ambulatory Visit: Payer: Self-pay | Admitting: Internal Medicine

## 2015-10-05 ENCOUNTER — Ambulatory Visit (INDEPENDENT_AMBULATORY_CARE_PROVIDER_SITE_OTHER): Payer: 59 | Admitting: Internal Medicine

## 2015-10-05 ENCOUNTER — Encounter: Payer: Self-pay | Admitting: Internal Medicine

## 2015-10-05 VITALS — BP 124/72 | HR 64 | Temp 97.3°F | Resp 16 | Ht 65.0 in | Wt 151.8 lb

## 2015-10-05 DIAGNOSIS — Z79899 Other long term (current) drug therapy: Secondary | ICD-10-CM

## 2015-10-05 DIAGNOSIS — E039 Hypothyroidism, unspecified: Secondary | ICD-10-CM | POA: Diagnosis not present

## 2015-10-05 DIAGNOSIS — E782 Mixed hyperlipidemia: Secondary | ICD-10-CM

## 2015-10-05 DIAGNOSIS — I10 Essential (primary) hypertension: Secondary | ICD-10-CM | POA: Diagnosis not present

## 2015-10-05 DIAGNOSIS — E559 Vitamin D deficiency, unspecified: Secondary | ICD-10-CM

## 2015-10-05 DIAGNOSIS — E139 Other specified diabetes mellitus without complications: Secondary | ICD-10-CM

## 2015-10-05 LAB — BASIC METABOLIC PANEL WITH GFR
BUN: 16 mg/dL (ref 7–25)
CALCIUM: 9.8 mg/dL (ref 8.6–10.4)
CO2: 28 mmol/L (ref 20–31)
CREATININE: 0.9 mg/dL (ref 0.50–1.05)
Chloride: 105 mmol/L (ref 98–110)
GFR, EST AFRICAN AMERICAN: 82 mL/min (ref >=60)
GFR, Est Non African American: 71 mL/min (ref >=60)
Glucose, Bld: 134 mg/dL — ABNORMAL HIGH (ref 65–99)
POTASSIUM: 4.7 mmol/L (ref 3.5–5.3)
Sodium: 141 mmol/L (ref 135–146)

## 2015-10-05 LAB — CBC WITH DIFFERENTIAL/PLATELET
BASOS PCT: 1 %
Basophils Absolute: 59 cells/uL (ref 0–200)
EOS ABS: 118 {cells}/uL (ref 15–500)
Eosinophils Relative: 2 %
HCT: 43.8 % (ref 35.0–45.0)
Hemoglobin: 14.4 g/dL (ref 11.7–15.5)
LYMPHS PCT: 34 %
Lymphs Abs: 2006 cells/uL (ref 850–3900)
MCH: 28.3 pg (ref 27.0–33.0)
MCHC: 32.9 g/dL (ref 32.0–36.0)
MCV: 86.1 fL (ref 80.0–100.0)
MONOS PCT: 8 %
MPV: 11.5 fL (ref 7.5–12.5)
Monocytes Absolute: 472 cells/uL (ref 200–950)
Neutro Abs: 3245 cells/uL (ref 1500–7800)
Neutrophils Relative %: 55 %
PLATELETS: 229 10*3/uL (ref 140–400)
RBC: 5.09 MIL/uL (ref 3.80–5.10)
RDW: 13.5 % (ref 11.0–15.0)
WBC: 5.9 10*3/uL (ref 3.8–10.8)

## 2015-10-05 LAB — LIPID PANEL
CHOLESTEROL: 157 mg/dL (ref 125–200)
HDL: 82 mg/dL (ref >=46)
LDL Cholesterol: 62 mg/dL (ref <130)
TRIGLYCERIDES: 63 mg/dL (ref <150)
Total CHOL/HDL Ratio: 1.9 Ratio (ref <=5.0)
VLDL: 13 mg/dL (ref <30)

## 2015-10-05 LAB — HEPATIC FUNCTION PANEL
ALBUMIN: 4 g/dL (ref 3.6–5.1)
ALK PHOS: 68 U/L (ref 33–130)
ALT: 21 U/L (ref 6–29)
AST: 18 U/L (ref 10–35)
BILIRUBIN TOTAL: 0.5 mg/dL (ref 0.2–1.2)
Bilirubin, Direct: 0.1 mg/dL (ref <=0.2)
Indirect Bilirubin: 0.4 mg/dL (ref 0.2–1.2)
Total Protein: 6.2 g/dL (ref 6.1–8.1)

## 2015-10-05 LAB — MAGNESIUM: MAGNESIUM: 2.2 mg/dL (ref 1.5–2.5)

## 2015-10-05 LAB — TSH: TSH: 4.53 mIU/L — ABNORMAL HIGH

## 2015-10-05 NOTE — Progress Notes (Signed)
[facility]                       Unk Pinto, M.D.        Uvaldo Bristle. Silverio Lay, P.A.-C       Starlyn Skeans, P.A.-C   [facility]                [address] [place] [id number] Telephone [phone] Telefax [phone] ______________________________________________________________________     This very nice 60 y.o.MWF presents for 6 month follow up with Hypertension, Hyperlipidemia, LADA (type 1 DM) and Vitamin D Deficiency.      Patient is treated for HTN circa 2004 & BP has been controlled at home. Today's BP: 124/72. Patient has had no complaints of any cardiac type chest pain, palpitations, dyspnea/orthopnea/PND, dizziness, claudication, or dependent edema.     Hyperlipidemia is controlled with diet & meds. Patient denies myalgias or other med SE's. Last Lipids were at goal: Lab Results  Component Value Date   CHOL 157 10/05/2015   HDL 82 10/05/2015   LDLCALC 62 10/05/2015   TRIG 63 10/05/2015   CHOLHDL 1.9 10/05/2015      Also, the patient has history of T2_IDDM predating since 2003  And treated initially with Metformin in 2014 and was started on insulin this year by Dr [name].  She denies symptoms of reactive hypoglycemia, diabetic polys, paresthesias or visual blurring.  Last A1c was  Lab Results  Component Value Date   HGBA1C 7.5 (H) 03/15/2015       Patient has compensated Hypothyroidism on treatment since RAI 131 treatment in 1999. Further, the patient also has history of Vitamin D Deficiency of "25" in 2008 and supplements vitamin D without any suspected side-effects. Last vitamin D was   Lab Results  Component Value Date   VD25OH 25 03/15/2015   Current Outpatient Prescriptions on File Prior to Visit  Medication Sig  . aspirin EC 81 MG tablet Take 1 tablet (81 mg total) by mouth daily.  Marland Kitchen atorvastatin  80 MG tablet TAKE 1 TABLET BY MOUTH ONCE DAILY  . VITAMIN D  1000 UNITS Take 6,000 Units by mouth daily.   Marland Kitchen estradiol  2 MG tablet Take 2 mg by mouth daily.   . ferrous fumarate / HEMOCYTE 325  Take 1 tablet by mouth daily.  Marland Kitchen NOVOLOG FLEXPEN Inject into the skin. Sliding scale  . levothyroxine  200 MCG tablet Takes 1 tab on M,W and 1/2 tab other 5 days   . lisinopril  10 MG tablet TAKE 1/2-1 TABLET BY MOUTH EVERYDAY  . Magnesium 500 MG CAPS Take 500 mg by mouth daily.  Marland Kitchen PROVERA 5 MG tablet Take 1 tablet 10 days every 3 months  . FISH OIL 1200 MG CAPS Take 1 cap 2 times daily.  Marland Kitchen OVER THE COUNTER MEDICATION Takes Biotin 1 daily.   No Known Allergies  PMHx:   Past Medical History:  Diagnosis Date  . Diabetes mellitus without complication ([place])   . Hyperlipidemia   . Hypertension   . Iron deficiency anemia 01/17/2011  . Thyroid disease   . Type II or unspecified type diabetes mellitus without mention of complication, not stated as uncontrolled 02/04/2013  . Varicose veins   . Vitamin D deficiency    Immunization History  Administered Date(s) Administered  . Influenza Split 12/06/2013,  12/08/2014  . Influenza Whole 01/28/2012  . PPD Test 02/08/2014, 03/15/2015  . Pneumococcal Polysaccharide-23 12/08/2014  . Tdap 03/04/2006   Past Surgical History:  Procedure Laterality Date  . VEIN LIGATION AND STRIPPING Left    FHx:    Reviewed / unchanged  SHx:    Reviewed / unchanged  Systems Review:  Constitutional: Denies fever, chills, wt changes, headaches, insomnia, fatigue, night sweats, change in appetite. Eyes: Denies redness, blurred vision, diplopia, discharge, itchy, watery eyes.  ENT: Denies discharge, congestion, post nasal drip, epistaxis, sore throat, earache, hearing loss, dental pain, tinnitus, vertigo, sinus pain, snoring.  CV: Denies chest pain, palpitations, irregular heartbeat, syncope, dyspnea, diaphoresis, orthopnea, PND, claudication or edema. Respiratory: denies cough, dyspnea, DOE, pleurisy, hoarseness, laryngitis,  wheezing.  Gastrointestinal: Denies dysphagia, odynophagia, heartburn, reflux, water brash, abdominal pain or cramps, nausea, vomiting, bloating, diarrhea, constipation, hematemesis, melena, hematochezia  or hemorrhoids. Genitourinary: Denies dysuria, frequency, urgency, nocturia, hesitancy, discharge, hematuria or flank pain. Musculoskeletal: Denies arthralgias, myalgias, stiffness, jt. swelling, pain, limping or strain/sprain.  Skin: Denies pruritus, rash, hives, warts, acne, eczema or change in skin lesion(s). Neuro: No weakness, tremor, incoordination, spasms, paresthesia or pain. Psychiatric: Denies confusion, memory loss or sensory loss. Endo: Denies change in weight, skin or hair change.  Heme/Lymph: No excessive bleeding, bruising or enlarged lymph nodes.  Physical Exam  BP 124/72   Pulse 64   Temp 97.3 F (36.3 C)   Resp 16   Ht 5\' 5"  (1.651 m)   Wt 151 lb 12.8 oz (68.9 kg)   LMP 08/11/2013 Comment: spotting  BMI 25.26 kg/m   Appears well nourished and in no distress.  Eyes: PERRLA, EOMs, conjunctiva no swelling or erythema. Sinuses: No frontal/maxillary tenderness ENT/Mouth: EAC's clear, TM's nl w/o erythema, bulging. Nares clear w/o erythema, swelling, exudates. Oropharynx clear without erythema or exudates. Oral hygiene is good. Tongue normal, non obstructing. Hearing intact.  Neck: Supple. Thyroid nl. Car 2+/2+ without bruits, nodes or JVD. Chest: Respirations nl with BS clear & equal w/o rales, rhonchi, wheezing or stridor.  Cor: Heart sounds normal w/ regular rate and rhythm without sig. murmurs, gallops, clicks, or rubs. Peripheral pulses normal and equal  without edema.  Abdomen: Soft & bowel sounds normal. Non-tender w/o guarding, rebound, hernias, masses, or organomegaly.  Lymphatics: Unremarkable.  Musculoskeletal: Full ROM all peripheral extremities, joint stability, 5/5 strength, and normal gait.  Skin: Warm, dry without exposed rashes, lesions or ecchymosis  apparent.  Neuro: Cranial nerves intact, reflexes equal bilaterally. Sensory-motor testing grossly intact. Tendon reflexes grossly intact.  Pysch: Alert & oriented x 3.  Insight and judgement nl & appropriate. No ideations.  Assessment and Plan:  1. Essential hypertension  - Continue medication, monitor blood pressure at home. Continue DASH diet. Reminder to go to the ER if any CP, SOB, nausea, dizziness, severe HA, changes vision/speech, left arm numbness and tingling and jaw pain.  - TSH  2. Mixed hyperlipidemia  - Continue diet/meds, exercise,& lifestyle modifications. Continue monitor periodic cholesterol/liver & renal functions  - Lipid panel - TSH  3. LADA (latent autoimmune diabetes in adults), managed as type 1 ([place])  - Continue diet, exercise, lifestyle modifications. Monitor appropriate labs.  4. Vitamin D deficiency  - Continue supplementation.  5. Hypothyroidism  - TSH  6. Medication management  - CBC with Differential/Platelet - BASIC METABOLIC PANEL WITH GFR - Hepatic function panel - Magnesium   Recommended regular exercise, BP monitoring, weight control, and discussed med and SE's. Recommended labs to  assess and monitor clinical status. Further disposition pending results of labs. Over 30 minutes of exam, counseling, chart review was performed

## 2015-10-05 NOTE — Patient Instructions (Addendum)

## 2015-10-07 ENCOUNTER — Encounter: Payer: Self-pay | Admitting: Internal Medicine

## 2015-11-07 ENCOUNTER — Other Ambulatory Visit: Payer: Self-pay | Admitting: Physician Assistant

## 2015-11-07 MED ORDER — LISINOPRIL 10 MG PO TABS: ORAL_TABLET | ORAL | 1 refills | Status: DC

## 2015-11-22 ENCOUNTER — Other Ambulatory Visit: Payer: Self-pay | Admitting: Internal Medicine

## 2016-01-05 ENCOUNTER — Ambulatory Visit: Payer: Self-pay | Admitting: Physician Assistant

## 2016-01-16 ENCOUNTER — Encounter: Payer: Self-pay | Admitting: Physician Assistant

## 2016-01-16 ENCOUNTER — Ambulatory Visit (INDEPENDENT_AMBULATORY_CARE_PROVIDER_SITE_OTHER): Payer: 59 | Admitting: Physician Assistant

## 2016-01-16 VITALS — BP 122/74 | HR 67 | Temp 97.1°F | Resp 16 | Ht 65.0 in | Wt 148.0 lb

## 2016-01-16 DIAGNOSIS — E559 Vitamin D deficiency, unspecified: Secondary | ICD-10-CM | POA: Diagnosis not present

## 2016-01-16 DIAGNOSIS — Z79899 Other long term (current) drug therapy: Secondary | ICD-10-CM

## 2016-01-16 DIAGNOSIS — E782 Mixed hyperlipidemia: Secondary | ICD-10-CM | POA: Diagnosis not present

## 2016-01-16 DIAGNOSIS — E109 Type 1 diabetes mellitus without complications: Secondary | ICD-10-CM | POA: Diagnosis not present

## 2016-01-16 DIAGNOSIS — I10 Essential (primary) hypertension: Secondary | ICD-10-CM

## 2016-01-16 DIAGNOSIS — E139 Other specified diabetes mellitus without complications: Secondary | ICD-10-CM

## 2016-01-16 LAB — BASIC METABOLIC PANEL WITH GFR
BUN: 20 mg/dL (ref 7–25)
CALCIUM: 9.9 mg/dL (ref 8.6–10.4)
CO2: 28 mmol/L (ref 20–31)
CREATININE: 0.82 mg/dL (ref 0.50–1.05)
Chloride: 105 mmol/L (ref 98–110)
GFR, Est African American: >89 mL/min (ref >=60)
GFR, Est Non African American: 79 mL/min (ref >=60)
Glucose, Bld: 106 mg/dL — ABNORMAL HIGH (ref 65–99)
Potassium: 4.8 mmol/L (ref 3.5–5.3)
SODIUM: 140 mmol/L (ref 135–146)

## 2016-01-16 LAB — CBC WITH DIFFERENTIAL/PLATELET
BASOS ABS: 61 {cells}/uL (ref 0–200)
Basophils Relative: 1 %
EOS PCT: 1 %
Eosinophils Absolute: 61 cells/uL (ref 15–500)
HCT: 46.3 % — ABNORMAL HIGH (ref 35.0–45.0)
HEMOGLOBIN: 15.1 g/dL (ref 11.7–15.5)
LYMPHS ABS: 1708 {cells}/uL (ref 850–3900)
LYMPHS PCT: 28 %
MCH: 28.1 pg (ref 27.0–33.0)
MCHC: 32.6 g/dL (ref 32.0–36.0)
MCV: 86.1 fL (ref 80.0–100.0)
MONOS PCT: 8 %
MPV: 11.2 fL (ref 7.5–12.5)
Monocytes Absolute: 488 cells/uL (ref 200–950)
NEUTROS PCT: 62 %
Neutro Abs: 3782 cells/uL (ref 1500–7800)
Platelets: 273 10*3/uL (ref 140–400)
RBC: 5.38 MIL/uL — ABNORMAL HIGH (ref 3.80–5.10)
RDW: 13.1 % (ref 11.0–15.0)
WBC: 6.1 10*3/uL (ref 3.8–10.8)

## 2016-01-16 LAB — LIPID PANEL
Cholesterol: 155 mg/dL (ref <200)
HDL: 73 mg/dL (ref >50)
LDL Cholesterol: 60 mg/dL (ref <100)
Total CHOL/HDL Ratio: 2.1 Ratio (ref <5.0)
Triglycerides: 110 mg/dL (ref <150)
VLDL: 22 mg/dL (ref <30)

## 2016-01-16 LAB — HEPATIC FUNCTION PANEL
ALT: 20 U/L (ref 6–29)
AST: 16 U/L (ref 10–35)
Albumin: 4 g/dL (ref 3.6–5.1)
Alkaline Phosphatase: 62 U/L (ref 33–130)
BILIRUBIN DIRECT: 0.1 mg/dL (ref <=0.2)
Indirect Bilirubin: 0.3 mg/dL (ref 0.2–1.2)
TOTAL PROTEIN: 6.4 g/dL (ref 6.1–8.1)
Total Bilirubin: 0.4 mg/dL (ref 0.2–1.2)

## 2016-01-16 NOTE — Progress Notes (Signed)
Assessment and Plan:  Hypertension:  -Continue medication -monitor blood pressure at home. -Continue DASH diet -Reminder to go to the ER if any CP, SOB, nausea, dizziness, severe HA, changes vision/speech, left arm numbness and tingling and jaw pain.  Cholesterol - Continue diet and exercise -Check cholesterol.   Late Onset Adult Diabetes without complications  -seeing Dr. Loanne Drilling -continue insulin -Continue diet and exercise.  -Check A1C  Vitamin D Def -check level -continue medications.   Hypothyroidism -check TSH level, continue medications the same, reminded to take on an empty stomach 30-22mins before food.   Continue diet and meds as discussed. Further disposition pending results of labs. Discussed med's effects and SE's.    HPI 60 y.o. female  presents for 3 month follow up with hypertension, hyperlipidemia, diabetes and vitamin D deficiency.   Her blood pressure has been controlled at home, today their BP is BP: 122/74.She does not workout. She denies chest pain, shortness of breath, dizziness.   She is on cholesterol medication and denies myalgias. Her cholesterol is at goal. The cholesterol was:  10/05/2015: Cholesterol 157; HDL 82; LDL Cholesterol 62; Triglycerides 63   She has been working on diet and exercise for diabetes without complications, she is on bASA, she is on ACE/ARB, and denies  foot ulcerations, hyperglycemia, hypoglycemia , increased appetite, nausea, paresthesia of the feet, polydipsia, polyuria, visual disturbances, vomiting and weight loss. Last A1C was: 03/15/2015: Hgb A1c MFr Bld 7.5.  She was recently found to have positive GAD65 antibody and was started on insulin, she states she mainly takes the glargine 18 units and normally does not normally have to take the aspart for the sliding scale.  No low blood sugars.    Patient is on Vitamin D supplement. 03/15/2015: Vit D, 25-Hydroxy 71  She is on thyroid medication. Her medication was changed last  visit, M/W takes 1/2 and 1 pill the rest of the day.   Lab Results  Component Value Date   TSH 4.53 (H) 10/05/2015   BMI is Body mass index is 24.63 kg/m., she is working on diet and exercise. Wt Readings from Last 3 Encounters:  01/16/16 148 lb (67.1 kg)  10/05/15 151 lb 12.8 oz (68.9 kg)  06/22/15 146 lb (66.2 kg)     Current Medications:  Current Outpatient Prescriptions on File Prior to Visit  Medication Sig Dispense Refill  . aspirin EC 81 MG tablet Take 1 tablet (81 mg total) by mouth daily. 150 tablet 2  . atorvastatin (LIPITOR) 80 MG tablet TAKE 1 TABLET BY MOUTH ONCE DAILY 30 tablet 2  . cholecalciferol (VITAMIN D) 1000 UNITS tablet Take 6,000 Units by mouth daily.     Marland Kitchen estradiol (ESTRACE) 2 MG tablet Take 2 mg by mouth daily.     . ferrous fumarate (HEMOCYTE - 106 MG FE) 325 (106 FE) MG TABS Take 1 tablet by mouth daily.    . Insulin Aspart (NOVOLOG FLEXPEN [place]) Inject into the skin. Sliding scale    . Insulin Glargine (TOUJEO SOLOSTAR [place]) Inject into the skin. Patient currently injects 18 units in the AM.    . levothyroxine (SYNTHROID, LEVOTHROID) 200 MCG tablet Take 1 tablet (200 mcg total) by mouth daily before breakfast. Takes 1 tab on M,W,F and 1/2 tab the other 4 days of the week (Patient taking differently: Take 200 mcg by mouth daily before breakfast. Takes 1 tab on M,W and 1/2 tab the other 5 days of the week) 90 tablet 1  . lisinopril (  PRINIVIL,ZESTRIL) 10 MG tablet TAKE 1/2-1 TABLET BY MOUTH EVERYDAY 90 tablet 1  . Magnesium 500 MG CAPS Take 500 mg by mouth daily.    . medroxyPROGESTERone (PROVERA) 5 MG tablet Take 1 tablet 10 days every 3 months    . Omega-3 Fatty Acids (FISH OIL) 1200 MG CAPS Take 1 capsule (1,200 mg total) by mouth 2 (two) times daily.    Marland Kitchen OVER THE COUNTER MEDICATION Takes Biotin 1 daily.    Marland Kitchen triamcinolone cream (KENALOG) 0.1 %      No current facility-administered medications on file prior to visit.    Medical History:  Past Medical  History:  Diagnosis Date  . Diabetes mellitus without complication ([place])   . Hyperlipidemia   . Hypertension   . Iron deficiency anemia 01/17/2011  . Thyroid disease   . Type II or unspecified type diabetes mellitus without mention of complication, not stated as uncontrolled 02/04/2013  . Varicose veins   . Vitamin D deficiency    Allergies: No Known Allergies   Review of Systems:  Review of Systems  Constitutional: Negative for chills, fever and malaise/fatigue.  HENT: Negative for congestion, ear pain and sore throat.   Eyes: Negative.   Respiratory: Negative for cough, shortness of breath and wheezing.   Cardiovascular: Negative for chest pain, palpitations and leg swelling.  Gastrointestinal: Negative for abdominal pain, blood in stool, constipation, diarrhea, heartburn and melena.  Genitourinary: Negative.   Skin: Negative.   Neurological: Negative for dizziness, sensory change, loss of consciousness and headaches.  Psychiatric/Behavioral: Negative for depression. The patient is not nervous/anxious and does not have insomnia.     Family history- Review and unchanged  Social history- Review and unchanged  Physical Exam: BP 122/74   Pulse 67   Temp 97.1 F (36.2 C)   Resp 16   Ht 5\' 5"  (1.651 m)   Wt 148 lb (67.1 kg)   LMP 08/11/2013 Comment: spotting  SpO2 97%   BMI 24.63 kg/m  Wt Readings from Last 3 Encounters:  01/16/16 148 lb (67.1 kg)  10/05/15 151 lb 12.8 oz (68.9 kg)  06/22/15 146 lb (66.2 kg)   General Appearance: Well nourished well developed, non-toxic appearing, in no apparent distress. Eyes: PERRLA, EOMs, conjunctiva no swelling or erythema ENT/Mouth: Ear canals clear with no erythema, swelling, or discharge.  TMs normal bilaterally, oropharynx clear, moist, with no exudate.   Neck: Supple, thyroid normal, no JVD, no cervical adenopathy.  Respiratory: Respiratory effort normal, breath sounds clear A&P, no wheeze, rhonchi or rales noted.  No  retractions, no accessory muscle usage Cardio: RRR with no MRGs. No noted edema.  Abdomen: Soft, + BS.  Non tender, no guarding, rebound, hernias, masses. Musculoskeletal: Full ROM, 5/5 strength, Normal gait Skin: Warm, dry without rashes, lesions, ecchymosis.  Neuro: Awake and oriented X 3, Cranial nerves intact. No cerebellar symptoms.  Psych: normal affect, Insight and Judgment appropriate.    Vicie Mutters, PA-C 8:43 AM [facility]

## 2016-01-17 LAB — TSH: TSH: 3.4 m[IU]/L

## 2016-01-31 ENCOUNTER — Other Ambulatory Visit: Payer: Self-pay | Admitting: Obstetrics & Gynecology

## 2016-01-31 DIAGNOSIS — N6320 Unspecified lump in the left breast, unspecified quadrant: Secondary | ICD-10-CM

## 2016-02-07 ENCOUNTER — Ambulatory Visit
Admission: RE | Admit: 2016-02-07 | Discharge: 2016-02-07 | Disposition: A | Discharge status: Home / Self Care | Payer: 59 | Source: Ambulatory Visit | Attending: Obstetrics & Gynecology | Admitting: Obstetrics & Gynecology

## 2016-02-07 DIAGNOSIS — N6320 Unspecified lump in the left breast, unspecified quadrant: Secondary | ICD-10-CM

## 2016-03-25 DIAGNOSIS — E109 Type 1 diabetes mellitus without complications: Secondary | ICD-10-CM | POA: Diagnosis not present

## 2016-03-25 DIAGNOSIS — E782 Mixed hyperlipidemia: Secondary | ICD-10-CM | POA: Diagnosis not present

## 2016-04-05 ENCOUNTER — Ambulatory Visit (INDEPENDENT_AMBULATORY_CARE_PROVIDER_SITE_OTHER): Payer: 59 | Admitting: Internal Medicine

## 2016-04-05 ENCOUNTER — Encounter: Payer: Self-pay | Admitting: Internal Medicine

## 2016-04-05 VITALS — BP 112/80 | HR 64 | Temp 97.0°F | Resp 16 | Ht 64.5 in | Wt 149.0 lb

## 2016-04-05 DIAGNOSIS — Z1212 Encounter for screening for malignant neoplasm of rectum: Secondary | ICD-10-CM

## 2016-04-05 DIAGNOSIS — Z136 Encounter for screening for cardiovascular disorders: Secondary | ICD-10-CM | POA: Diagnosis not present

## 2016-04-05 DIAGNOSIS — Z23 Encounter for immunization: Secondary | ICD-10-CM

## 2016-04-05 DIAGNOSIS — Z111 Encounter for screening for respiratory tuberculosis: Secondary | ICD-10-CM | POA: Diagnosis not present

## 2016-04-05 DIAGNOSIS — I10 Essential (primary) hypertension: Secondary | ICD-10-CM

## 2016-04-05 DIAGNOSIS — E559 Vitamin D deficiency, unspecified: Secondary | ICD-10-CM

## 2016-04-05 DIAGNOSIS — E039 Hypothyroidism, unspecified: Secondary | ICD-10-CM

## 2016-04-05 DIAGNOSIS — E139 Other specified diabetes mellitus without complications: Secondary | ICD-10-CM

## 2016-04-05 DIAGNOSIS — R5383 Other fatigue: Secondary | ICD-10-CM

## 2016-04-05 DIAGNOSIS — Z0000 Encounter for general adult medical examination without abnormal findings: Secondary | ICD-10-CM | POA: Diagnosis not present

## 2016-04-05 DIAGNOSIS — E782 Mixed hyperlipidemia: Secondary | ICD-10-CM

## 2016-04-05 DIAGNOSIS — Z79899 Other long term (current) drug therapy: Secondary | ICD-10-CM

## 2016-04-05 DIAGNOSIS — Z0001 Encounter for general adult medical examination with abnormal findings: Secondary | ICD-10-CM

## 2016-04-05 LAB — LIPID PANEL
CHOLESTEROL: 171 mg/dL (ref <200)
HDL: 76 mg/dL (ref >50)
LDL Cholesterol: 80 mg/dL (ref <100)
TRIGLYCERIDES: 74 mg/dL (ref <150)
Total CHOL/HDL Ratio: 2.3 Ratio (ref <5.0)
VLDL: 15 mg/dL (ref <30)

## 2016-04-05 LAB — CBC WITH DIFFERENTIAL/PLATELET
BASOS PCT: 1 %
Basophils Absolute: 65 cells/uL (ref 0–200)
EOS PCT: 2 %
Eosinophils Absolute: 130 cells/uL (ref 15–500)
HCT: 43.1 % (ref 35.0–45.0)
HEMOGLOBIN: 14.1 g/dL (ref 11.7–15.5)
Lymphocytes Relative: 28 %
Lymphs Abs: 1820 cells/uL (ref 850–3900)
MCH: 28.4 pg (ref 27.0–33.0)
MCHC: 32.7 g/dL (ref 32.0–36.0)
MCV: 86.7 fL (ref 80.0–100.0)
MPV: 10.6 fL (ref 7.5–12.5)
Monocytes Absolute: 390 cells/uL (ref 200–950)
Monocytes Relative: 6 %
NEUTROS ABS: 4095 {cells}/uL (ref 1500–7800)
Neutrophils Relative %: 63 %
PLATELETS: 259 10*3/uL (ref 140–400)
RBC: 4.97 MIL/uL (ref 3.80–5.10)
RDW: 13.2 % (ref 11.0–15.0)
WBC: 6.5 10*3/uL (ref 3.8–10.8)

## 2016-04-05 LAB — IRON AND TIBC
%SAT: 30 % (ref 11–50)
Iron: 94 ug/dL (ref 45–160)
TIBC: 311 ug/dL (ref 250–450)
UIBC: 217 ug/dL (ref 125–400)

## 2016-04-05 LAB — TSH: TSH: 4.48 m[IU]/L

## 2016-04-05 NOTE — Progress Notes (Signed)
[facility] Unk Pinto, M.D.    Uvaldo Bristle. Silverio Lay, P.A.-C      Starlyn Skeans, P.A.-C  [facility]                [address] [place], [place] [id number] Telephone [phone] Telefax [phone]  Annual Screening/Preventative Visit & Comprehensive Evaluation &  Examination     This very nice 61 y.o. MWF presents for a Screening/Preventative Visit & comprehensive evaluation and management of multiple medical co-morbidities.  Patient has been followed for HTN, LADA, Hyperlipidemia and Vitamin D Deficiency.      HTN predates since 2004. Patient's BP has been controlled at home and patient denies any cardiac symptoms as chest pain, palpitations, shortness of breath, dizziness or ankle swelling. Today's BP is  112/80.      Patient's hyperlipidemia is controlled with diet and Atorvastatin. Patient denies myalgias or other medication SE's. Last lipids were at goal: Lab Results  Component Value Date   CHOL 155 01/16/2016   HDL 73 01/16/2016   LDLCALC 60 01/16/2016   TRIG 110 01/16/2016   CHOLHDL 2.1 01/16/2016      Patient has glucose intolerance predating since 2003, then started on Metformin in 2014 and last year was switched to Insulin by Dr [name] for LADA as she was found to has elevated insulin antibodies.     Patient take a base of 18 units of Toujeo daily and covers bid with small doses (1 unit) of Novolog if glucose is >120 (which apparently is infrequent). She denies reactive hypoglycemic symptoms, visual blurring, diabetic polys, or paresthesias. Last A1c was in Jan 2017 in this office and recently was A1c 6.0% last month with Dr [name].      Patient was treated with RAI 132 in 1999 for Graves and has since been on thyroid replacement. Finally, patient has history of Vitamin D Deficiency in 2008 of "25" and last Vitamin D was at goal: Lab Results  Component Value Date   VD25OH 66  03/15/2015   Current Outpatient Prescriptions on File Prior to Visit  Medication Sig  . aspirin EC 81 MG Take 1 tab daily.  Marland Kitchen atorvastatin  80 MG t TAKE 1 TAB ONCE DAILY  . VITAMIN D 1000 UNITS Take 6,000 Units  daily.   Marland Kitchen estradiol  2 MG tablet Take  daily.   . ferrous fumarate 325  Take 1 tab daily.  Marland Kitchen NOVOLOG FLEXPEN  Inject into the skin. Sliding scale  . Insulin Glargine TOUJEO  Inject into the skin. Patient currently injects 18 units in the AM.  . levothyroxine  200 MCG  Take 200 mcg dailyon M,W and 1/2 tab the other 5 days = 4&1/2 tab or 900 mcg /week averages 128 mcg/day  . lisinopril  10 MG tablet TAKE 1/2-1 TAB EVERYDAY  . Magnesium 500 MG CAPS Take  daily.  Marland Kitchen PROVERA 5 MG tablet Take 1 tab 10 days every 3 months  . Omega-3 FISH OIL 1200 MG  Take 1 cap 2  times daily.  . Biotin Takes  1 daily.   No Known Allergies   Past Medical History:  Diagnosis Date  . Diabetes mellitus without complication ([place])   . Hyperlipidemia   . Hypertension   . Iron deficiency anemia 01/17/2011  . Thyroid disease   . Type II or unspecified type diabetes mellitus without mention of  complication, not stated as uncontrolled 02/04/2013  . Varicose veins   . Vitamin D deficiency    Health Maintenance  Topic Date Due  . HEMOGLOBIN A1C  09/12/2015  . INFLUENZA VACCINE  10/03/2015  . TETANUS/TDAP  03/04/2016  . FOOT EXAM  03/14/2016  . OPHTHALMOLOGY EXAM  06/20/2016  . PAP SMEAR  01/02/2017  . MAMMOGRAM  06/06/2017  . COLONOSCOPY  12/14/2018  . PNEUMOCOCCAL POLYSACCHARIDE VACCINE (2) 12/08/2019  . Hepatitis C Screening  Completed  . HIV Screening  Completed   Immunization History  Administered Date(s) Administered  . Influenza Split 12/06/2013, 12/08/2014  . Influenza Whole 01/28/2012  . Influenza-Unspecified 12/04/2015  . PPD Test 02/08/2014, 03/15/2015, 04/05/2016  . Pneumococcal Polysaccharide-23 12/08/2014  . Td 04/05/2016  . Tdap 03/04/2006   Past Surgical History:  Procedure  Laterality Date  . VEIN LIGATION AND STRIPPING Left    Family History  Problem Relation Age of Onset  . Diabetes Mother   . Thyroid disease Mother   . Diabetes Father   . Osteoporosis Father   . Thyroid disease Sister    Social History  Substance Use Topics  . Smoking status: Former Smoker    Packs/day: 1.00    Quit date: 03/20/2010  . Smokeless tobacco: Never Used  . Alcohol use 0.0 oz/week     Comment: ocassional    ROS Constitutional: Denies fever, chills, weight loss/gain, headaches, insomnia,  night sweats, and change in appetite. Does c/o fatigue. Eyes: Denies redness, blurred vision, diplopia, discharge, itchy, watery eyes.  ENT: Denies discharge, congestion, post nasal drip, epistaxis, sore throat, earache, hearing loss, dental pain, Tinnitus, Vertigo, Sinus pain, snoring.  Cardio: Denies chest pain, palpitations, irregular heartbeat, syncope, dyspnea, diaphoresis, orthopnea, PND, claudication, edema Respiratory: denies cough, dyspnea, DOE, pleurisy, hoarseness, laryngitis, wheezing.  Gastrointestinal: Denies dysphagia, heartburn, reflux, water brash, pain, cramps, nausea, vomiting, bloating, diarrhea, constipation, hematemesis, melena, hematochezia, jaundice, hemorrhoids Genitourinary: Denies dysuria, frequency, urgency, nocturia, hesitancy, discharge, hematuria, flank pain Breast: Breast lumps, nipple discharge, bleeding.  Musculoskeletal: Denies arthralgia, myalgia, stiffness, Jt. Swelling, pain, limp, and strain/sprain. Denies falls. Skin: Denies puritis, rash, hives, warts, acne, eczema, changing in skin lesion Neuro: No weakness, tremor, incoordination, spasms, paresthesia, pain Psychiatric: Denies confusion, memory loss, sensory loss. Denies Depression. Endocrine: Denies change in weight, skin, hair change, nocturia, and paresthesia, diabetic polys, visual blurring, hyper / hypo glycemic episodes.  Heme/Lymph: No excessive bleeding, bruising, enlarged lymph  nodes.  Physical Exam  BP 112/80   Pulse 64   Temp 97 F (36.1 C)   Resp 16   Ht 5' 4.5" (1.638 m)   Wt 149 lb (67.6 kg)   LMP 08/11/2013 Comment: spotting  BMI 25.18 kg/m   General Appearance: Well nourished and in no apparent distress.  Eyes: PERRLA, EOMs, conjunctiva no swelling or erythema, normal fundi and vessels. Sinuses: No frontal/maxillary tenderness ENT/Mouth: EACs patent / TMs  nl. Nares clear without erythema, swelling, mucoid exudates. Oral hygiene is good. No erythema, swelling, or exudate. Tongue normal, non-obstructing. Tonsils not swollen or erythematous. Hearing normal.  Neck: Supple, thyroid normal. No bruits, nodes or JVD. Respiratory: Respiratory effort normal.  BS equal and clear bilateral without rales, rhonci, wheezing or stridor. Cardio: Heart sounds are normal with regular rate and rhythm and no murmurs, rubs or gallops. Peripheral pulses are normal and equal bilaterally without edema. No aortic or femoral bruits. Chest: symmetric with normal excursions and percussion. Breasts: Symmetric, without lumps, nipple discharge, retractions, or fibrocystic changes.  Abdomen: Flat,  soft with bowel sounds active. Nontender, no guarding, rebound, hernias, masses, or organomegaly.  Lymphatics: Non tender without lymphadenopathy.  Genitourinary:  Musculoskeletal: Full ROM all peripheral extremities, joint stability, 5/5 strength, and normal gait. Skin: Warm and dry without rashes, lesions, cyanosis, clubbing or  ecchymosis.  Neuro: Cranial nerves intact, reflexes equal bilaterally. Normal muscle tone, no cerebellar symptoms. Sensation intact.  Pysch: Alert and oriented X 3, normal affect, Insight and Judgment appropriate.   Assessment and Plan  1. Annual Preventative Screening Examination  2. Essential hypertension  - Microalbumin / creatinine urine ratio - EKG 12-Lead - [place], RETROPERITNL ABD,  LTD - Urinalysis, Routine w reflex microscopic - CBC with  Differential/Platelet - Magnesium - TSH  3. Mixed hyperlipidemia  - EKG 12-Lead - [place], RETROPERITNL ABD,  LTD - Lipid panel - TSH  4. LADA (latent autoimmune diabetes in adults), managed as type 1 ([place])  - Microalbumin / creatinine urine ratio - EKG 12-Lead - [place], RETROPERITNL ABD,  LTD - VITAMIN D 25 Hydroxy   5. Vitamin D deficiency   6. Screening for rectal cancer  - POC Hemoccult Bld/Stl   7. Screening for ischemic heart disease  - EKG 12-Lead  8. Screening for AAA (aortic abdominal aneurysm)  - [place], RETROPERITNL ABD,  LTD  9. Screening examination for pulmonary tuberculosis  - PPD  10. Need for prophylactic vaccination with tetanus-diphtheria (TD)  - Td : Tetanus/diphtheria >7yo Preservative  free  11. Other fatigue  - Vitamin B12 - Iron and TIBC - CBC with Differential/Platelet  12. Hypothyroidism  - New Rx written for l-Thyroxine 125 mcg/daily  13. Medication management  - Urinalysis, Routine w reflex microscopic - CBC with Differential/Platelet - Magnesium - Lipid panel - TSH - VITAMIN D 25 Hydroxy        Continue prudent diet as discussed, weight control, BP monitoring, regular exercise, and medications. Discussed med's effects and SE's. Screening labs and tests as requested with regular follow-up as recommended. Over 40 minutes of exam, counseling, chart review and high complex critical decision making was performed.

## 2016-04-05 NOTE — Patient Instructions (Signed)

## 2016-04-06 LAB — URINALYSIS, ROUTINE W REFLEX MICROSCOPIC
BILIRUBIN URINE: NEGATIVE
GLUCOSE, UA: NEGATIVE
Hgb urine dipstick: NEGATIVE
KETONES UR: NEGATIVE
Leukocytes, UA: NEGATIVE
Nitrite: NEGATIVE
Protein, ur: NEGATIVE
SPECIFIC GRAVITY, URINE: 1.019 (ref 1.001–1.035)
pH: 7 (ref 5.0–8.0)

## 2016-04-06 LAB — MICROALBUMIN / CREATININE URINE RATIO
Creatinine, Urine: 88 mg/dL (ref 20–320)
Microalb, Ur: <0.2 mg/dL

## 2016-04-06 LAB — VITAMIN B12: VITAMIN B 12: 751 pg/mL (ref 200–1100)

## 2016-04-06 LAB — VITAMIN D 25 HYDROXY (VIT D DEFICIENCY, FRACTURES): Vit D, 25-Hydroxy: 79 ng/mL (ref 30–100)

## 2016-04-06 LAB — MAGNESIUM: MAGNESIUM: 2.2 mg/dL (ref 1.5–2.5)

## 2016-04-30 ENCOUNTER — Other Ambulatory Visit: Payer: Self-pay

## 2016-04-30 DIAGNOSIS — Z1212 Encounter for screening for malignant neoplasm of rectum: Secondary | ICD-10-CM

## 2016-04-30 LAB — POC HEMOCCULT BLD/STL (HOME/3-CARD/SCREEN)
Card #3 Fecal Occult Blood, POC: NEGATIVE
FECAL OCCULT BLD: NEGATIVE
FECAL OCCULT BLD: NEGATIVE

## 2016-05-15 ENCOUNTER — Other Ambulatory Visit: Payer: Self-pay

## 2016-05-15 MED ORDER — ATORVASTATIN CALCIUM 80 MG PO TABS: 80.0000 mg | ORAL_TABLET | Freq: Every day | ORAL | 2 refills | Status: DC

## 2016-05-31 DIAGNOSIS — E109 Type 1 diabetes mellitus without complications: Secondary | ICD-10-CM | POA: Diagnosis not present

## 2016-05-31 DIAGNOSIS — I10 Essential (primary) hypertension: Secondary | ICD-10-CM | POA: Diagnosis not present

## 2016-05-31 DIAGNOSIS — E782 Mixed hyperlipidemia: Secondary | ICD-10-CM | POA: Diagnosis not present

## 2016-07-01 DIAGNOSIS — Z01419 Encounter for gynecological examination (general) (routine) without abnormal findings: Secondary | ICD-10-CM | POA: Diagnosis not present

## 2016-07-03 LAB — HM MAMMOGRAPHY

## 2016-07-04 LAB — HM PAP SMEAR: HM PAP: NEGATIVE

## 2016-07-09 ENCOUNTER — Other Ambulatory Visit: Payer: Self-pay

## 2016-07-09 MED ORDER — LISINOPRIL 10 MG PO TABS: ORAL_TABLET | ORAL | 1 refills | Status: DC

## 2016-07-09 NOTE — Progress Notes (Signed)
Assessment and Plan:  Hypertension:  -Continue medication -monitor blood pressure at home. -Continue DASH diet -Reminder to go to the ER if any CP, SOB, nausea, dizziness, severe HA, changes vision/speech, left arm numbness and tingling and jaw pain.  Cholesterol - Continue diet and exercise -Check cholesterol.   Late Onset Adult Diabetes without complications  -seeing Dr. Loanne Drilling -continue insulin -Continue diet and exercise.  -Check A1C  Vitamin D Def -check level -continue medications.   Hypothyroidism -check TSH level, continue medications the same, reminded to take on an empty stomach 30-34mins before food.   Allergic rhinitis - Allegra OTC, increase H20, allergy hygiene explained.   Continue diet and meds as discussed. Further disposition pending results of labs. Discussed med's effects and SE's.    HPI 61 y.o. female  presents for 3 month follow up with hypertension, hyperlipidemia, diabetes and vitamin D deficiency.   Her blood pressure has been controlled at home, today their BP is BP: 100/66.She does not workout. She denies chest pain, shortness of breath, dizziness.   She is on cholesterol medication and denies myalgias. Her cholesterol is at goal. The cholesterol was:  Marland Kitchen Lab Results  Component Value Date   CHOL 171 04/05/2016   HDL 76 04/05/2016   LDLCALC 80 04/05/2016   TRIG 74 04/05/2016   CHOLHDL 2.3 04/05/2016    She has been working on diet and exercise for diabetes without complications, +OHY07 antibody, on glargine 18 units, very rarely on aspart, she checks her sugars 3 x a day,  she is on bASA, she is on ACE/ARB, and denies  foot ulcerations, hyperglycemia, hypoglycemia , increased appetite, nausea, paresthesia of the feet, polydipsia, polyuria, visual disturbances, vomiting and weight loss. Last A1C was: Lab Results  Component Value Date   HGBA1C 7.5 (H) 03/15/2015   Patient is on Vitamin D supplement. 04/05/2016: Vit D, 25-Hydroxy 79  She is  on thyroid medication. Her medication was changed last visit, she is on 200 MCG daily.  Lab Results  Component Value Date   TSH 4.48 04/05/2016   BMI is Body mass index is 25.52 kg/m., she is working on diet and exercise. Wt Readings from Last 3 Encounters:  07/10/16 151 lb (68.5 kg)  04/05/16 149 lb (67.6 kg)  01/16/16 148 lb (67.1 kg)     Current Medications:  Current Outpatient Prescriptions on File Prior to Visit  Medication Sig Dispense Refill  . aspirin EC 81 MG tablet Take 1 tablet (81 mg total) by mouth daily. 150 tablet 2  . atorvastatin (LIPITOR) 80 MG tablet Take 1 tablet (80 mg total) by mouth daily. 30 tablet 2  . cholecalciferol (VITAMIN D) 1000 UNITS tablet Take 6,000 Units by mouth daily.     Marland Kitchen estradiol (ESTRACE) 2 MG tablet Take 2 mg by mouth daily.     . ferrous fumarate (HEMOCYTE - 106 MG FE) 325 (106 FE) MG TABS Take 1 tablet by mouth daily.    . Insulin Aspart (NOVOLOG FLEXPEN [place]) Inject into the skin. Sliding scale    . Insulin Glargine (TOUJEO SOLOSTAR [place]) Inject into the skin. Patient currently injects 18 units in the AM.    . levothyroxine (SYNTHROID, LEVOTHROID) 200 MCG tablet Take 1 tablet (200 mcg total) by mouth daily before breakfast. Takes 1 tab on M,W,F and 1/2 tab the other 4 days of the week (Patient taking differently: Take 200 mcg by mouth daily before breakfast. Takes 1 tab on M,W and 1/2 tab the other 5 days  of the week) 90 tablet 1  . lisinopril (PRINIVIL,ZESTRIL) 10 MG tablet TAKE 1/2-1 TABLET BY MOUTH EVERYDAY 90 tablet 1  . Magnesium 500 MG CAPS Take 500 mg by mouth daily.    . medroxyPROGESTERone (PROVERA) 5 MG tablet Take 1 tablet 10 days every 3 months    . Omega-3 Fatty Acids (FISH OIL) 1200 MG CAPS Take 1 capsule (1,200 mg total) by mouth 2 (two) times daily.     No current facility-administered medications on file prior to visit.    Medical History:  Past Medical History:  Diagnosis Date  . Diabetes mellitus without complication  ([place])   . Hyperlipidemia   . Hypertension   . Iron deficiency anemia 01/17/2011  . Thyroid disease   . Type II or unspecified type diabetes mellitus without mention of complication, not stated as uncontrolled 02/04/2013  . Varicose veins   . Vitamin D deficiency    Allergies: No Known Allergies   Review of Systems:  Review of Systems  Constitutional: Negative for chills, fever and malaise/fatigue.  HENT: Negative for congestion, ear pain and sore throat.   Eyes: Negative.   Respiratory: Negative for cough, shortness of breath and wheezing.   Cardiovascular: Negative for chest pain, palpitations and leg swelling.  Gastrointestinal: Negative for abdominal pain, blood in stool, constipation, diarrhea, heartburn and melena.  Genitourinary: Negative.   Skin: Negative.   Neurological: Negative for dizziness, sensory change, loss of consciousness and headaches.  Psychiatric/Behavioral: Negative for depression. The patient is not nervous/anxious and does not have insomnia.     Family history- Review and unchanged  Social history- Review and unchanged  Physical Exam: BP 100/66   Pulse 72   Temp 97.3 F (36.3 C)   Resp 14   Ht 5' 4.5" (1.638 m)   Wt 151 lb (68.5 kg)   LMP 08/11/2013 Comment: spotting  SpO2 98%   BMI 25.52 kg/m  Wt Readings from Last 3 Encounters:  07/10/16 151 lb (68.5 kg)  04/05/16 149 lb (67.6 kg)  01/16/16 148 lb (67.1 kg)   General Appearance: Well nourished well developed, non-toxic appearing, in no apparent distress. Eyes: PERRLA, EOMs, conjunctiva no swelling or erythema ENT/Mouth: Ear canals clear with no erythema, swelling, or discharge.  TMs normal bilaterally, oropharynx clear, moist, with no exudate.   Neck: Supple, thyroid normal, no JVD, no cervical adenopathy.  Respiratory: Respiratory effort normal, breath sounds clear A&P, no wheeze, rhonchi or rales noted.  No retractions, no accessory muscle usage Cardio: RRR with no MRGs. No noted edema.   Abdomen: Soft, + BS.  Non tender, no guarding, rebound, hernias, masses. Musculoskeletal: Full ROM, 5/5 strength, Normal gait Skin: Warm, dry without rashes, lesions, ecchymosis.  Neuro: Awake and oriented X 3, Cranial nerves intact. No cerebellar symptoms.  Psych: normal affect, Insight and Judgment appropriate.    Vicie Mutters, PA-C 8:58 AM [facility]

## 2016-07-10 ENCOUNTER — Ambulatory Visit (INDEPENDENT_AMBULATORY_CARE_PROVIDER_SITE_OTHER): Payer: 59 | Admitting: Physician Assistant

## 2016-07-10 ENCOUNTER — Encounter: Payer: Self-pay | Admitting: Physician Assistant

## 2016-07-10 ENCOUNTER — Other Ambulatory Visit: Payer: Self-pay | Admitting: Physician Assistant

## 2016-07-10 VITALS — BP 100/66 | HR 72 | Temp 97.3°F | Resp 14 | Ht 64.5 in | Wt 151.0 lb

## 2016-07-10 DIAGNOSIS — E559 Vitamin D deficiency, unspecified: Secondary | ICD-10-CM | POA: Diagnosis not present

## 2016-07-10 DIAGNOSIS — Z79899 Other long term (current) drug therapy: Secondary | ICD-10-CM | POA: Diagnosis not present

## 2016-07-10 DIAGNOSIS — I10 Essential (primary) hypertension: Secondary | ICD-10-CM

## 2016-07-10 DIAGNOSIS — E039 Hypothyroidism, unspecified: Secondary | ICD-10-CM

## 2016-07-10 DIAGNOSIS — E109 Type 1 diabetes mellitus without complications: Secondary | ICD-10-CM

## 2016-07-10 DIAGNOSIS — E139 Other specified diabetes mellitus without complications: Secondary | ICD-10-CM

## 2016-07-10 DIAGNOSIS — E782 Mixed hyperlipidemia: Secondary | ICD-10-CM

## 2016-07-10 DIAGNOSIS — D509 Iron deficiency anemia, unspecified: Secondary | ICD-10-CM

## 2016-07-10 LAB — CBC WITH DIFFERENTIAL/PLATELET
BASOS ABS: 90 {cells}/uL (ref 0–200)
Basophils Relative: 1 %
EOS ABS: 90 {cells}/uL (ref 15–500)
EOS PCT: 1 %
HCT: 42.8 % (ref 35.0–45.0)
HEMOGLOBIN: 13.7 g/dL (ref 11.7–15.5)
LYMPHS ABS: 1530 {cells}/uL (ref 850–3900)
Lymphocytes Relative: 17 %
MCH: 28 pg (ref 27.0–33.0)
MCHC: 32 g/dL (ref 32.0–36.0)
MCV: 87.5 fL (ref 80.0–100.0)
MPV: 10.2 fL (ref 7.5–12.5)
Monocytes Absolute: 630 cells/uL (ref 200–950)
Monocytes Relative: 7 %
NEUTROS ABS: 6660 {cells}/uL (ref 1500–7800)
Neutrophils Relative %: 74 %
Platelets: 248 10*3/uL (ref 140–400)
RBC: 4.89 MIL/uL (ref 3.80–5.10)
RDW: 12.8 % (ref 11.0–15.0)
WBC: 9 10*3/uL (ref 3.8–10.8)

## 2016-07-10 LAB — HEPATIC FUNCTION PANEL
ALK PHOS: 64 U/L (ref 33–130)
ALT: 19 U/L (ref 6–29)
AST: 15 U/L (ref 10–35)
Albumin: 3.8 g/dL (ref 3.6–5.1)
BILIRUBIN DIRECT: 0.1 mg/dL (ref <=0.2)
BILIRUBIN INDIRECT: 0.3 mg/dL (ref 0.2–1.2)
Total Bilirubin: 0.4 mg/dL (ref 0.2–1.2)
Total Protein: 6.1 g/dL (ref 6.1–8.1)

## 2016-07-10 LAB — IRON AND TIBC
%SAT: 31 % (ref 11–50)
Iron: 93 ug/dL (ref 45–160)
TIBC: 302 ug/dL (ref 250–450)
UIBC: 209 ug/dL (ref 125–400)

## 2016-07-10 LAB — TSH: TSH: 5.82 m[IU]/L — AB

## 2016-07-10 LAB — LIPID PANEL
CHOL/HDL RATIO: 2.2 ratio (ref <5.0)
Cholesterol: 147 mg/dL (ref <200)
HDL: 67 mg/dL (ref >50)
LDL CALC: 64 mg/dL (ref <100)
Triglycerides: 81 mg/dL (ref <150)
VLDL: 16 mg/dL (ref <30)

## 2016-07-10 LAB — BASIC METABOLIC PANEL WITH GFR
BUN: 23 mg/dL (ref 7–25)
CHLORIDE: 106 mmol/L (ref 98–110)
CO2: 29 mmol/L (ref 20–31)
CREATININE: 0.98 mg/dL (ref 0.50–1.05)
Calcium: 9.2 mg/dL (ref 8.6–10.4)
GFR, Est African American: 74 mL/min (ref >=60)
GFR, Est Non African American: 64 mL/min (ref >=60)
Glucose, Bld: 122 mg/dL — ABNORMAL HIGH (ref 65–99)
POTASSIUM: 4.3 mmol/L (ref 3.5–5.3)
SODIUM: 141 mmol/L (ref 135–146)

## 2016-07-10 LAB — MAGNESIUM: MAGNESIUM: 2 mg/dL (ref 1.5–2.5)

## 2016-07-10 NOTE — Patient Instructions (Addendum)
Please pick one of the over the counter allergy medications below and take it once daily for allergies.  Claritin or loratadine cheapest but likely the weakest  Zyrtec or certizine at night because it can make you sleepy The strongest is allegra or fexafinadine  Cheapest at [employer], [employer], [employer]  Here are things you can do to help with this: - Try the Flonase or Nasonex. Remember to spray each nostril twice towards the outer part of your eye.  Do not sniff but instead pinch your nose and tilt your head back to help the medicine get into your sinuses.  The best time to do this is at bedtime.Stop if you get blurred vision or nose bleeds.    if worsening HA, changes vision/speech, imbalance, weakness go to the ER  If you start to feel dizzy, make sure you are drinking plenty of fluids but you can also cut your lisinopril in half if you BP is getting lower than 100/60.    Please monitor your blood pressure. If it is getting below 130/80 AND you are having fatigue with exertion, dizziness we may need to cut your blood pressure medication in half. Please call the office if this is happening. Hypotension As your heart beats, it forces blood through your body. This force is called blood pressure. If you have hypotension, you have low blood pressure. When your blood pressure is too low, you may not get enough blood to your brain. You may feel weak, feel lightheaded, have a fast heartbeat, or even pass out (faint). HOME CARE  Drink enough fluids to keep your pee (urine) clear or pale yellow.  Take all medicines as told by your doctor.  Get up slowly after sitting or lying down.  Wear support stockings as told by your doctor.  Maintain a healthy diet by including foods such as fruits, vegetables, nuts, whole grains, and lean meats. GET HELP IF:  You are throwing up (vomiting) or have watery poop (diarrhea).  You have a fever for more than 2-3 days.  You feel more thirsty than usual.  You  feel weak and tired. GET HELP RIGHT AWAY IF:   You pass out (faint).  You have chest pain or a fast or irregular heartbeat.  You lose feeling in part of your body.  You cannot move your arms or legs.  You have trouble speaking.  You get sweaty or feel lightheaded. MAKE SURE YOU:   Understand these instructions.  Will watch your condition.  Will get help right away if you are not doing well or get worse. Document Released: 05/15/2009 Document Revised: 10/21/2012 Document Reviewed: 08/21/2012 [facility] Patient Information 2015 [place], [place]. This information is not intended to replace advice given to you by your health care provider. Make sure you discuss any questions you have with your health care provider.   If you do calorie restriction will need to decrease your insulin.    Simple math prevails.    1st - exercise does not produce significant weight loss - at best one converts fat into muscle , "bulks up", loses inches, but usually stays "weight neutral"     2nd - think of your body weightas a check book: If you eat more calories than you burn up - you save money or gain weight .... Or if you spend more money than you put in the check book, ie burn up more calories than you eat, then you lose weight     3rd - if you walk  or run 1 mile, you burn up 100 calories - you have to burn up 3,500 calories to lose 1 pound, ie you have to walk/run 35 miles to lose 1 measly pound. So if you want to lose 10 #, then you have to walk/run 350 miles, so.... clearly exercise is not the solution.     4. So if you consume 1,500 calories, then you have to burn up the equivalent of 15 miles to stay weight neutral - It also stands to reason that if you consume 1,500 cal/day and don't lose weight, then you must be burning up about 1,500 cals/day to stay weight neutral.     5. If you really want to lose weight, you must cut your calorie intake 300 calories /day and at that rate you should lose about  1 # every 3 days.   6. Please purchase Dr Fara Olden Fuhrman's book(s) "The End of Dieting" & "Eat to Live" . It has some great concepts and recipes.

## 2016-07-11 LAB — VITAMIN D 25 HYDROXY (VIT D DEFICIENCY, FRACTURES): VIT D 25 HYDROXY: 74 ng/mL (ref 30–100)

## 2016-07-11 LAB — HEMOGLOBIN A1C
Hgb A1c MFr Bld: 6.1 % — ABNORMAL HIGH (ref <5.7)
Mean Plasma Glucose: 128 mg/dL

## 2016-07-23 LAB — HM DIABETES EYE EXAM

## 2016-08-13 ENCOUNTER — Other Ambulatory Visit: Payer: 59

## 2016-08-13 DIAGNOSIS — E039 Hypothyroidism, unspecified: Secondary | ICD-10-CM

## 2016-08-14 LAB — TSH: TSH: 4.04 m[IU]/L

## 2016-08-26 DIAGNOSIS — E109 Type 1 diabetes mellitus without complications: Secondary | ICD-10-CM | POA: Diagnosis not present

## 2016-08-26 DIAGNOSIS — E782 Mixed hyperlipidemia: Secondary | ICD-10-CM | POA: Diagnosis not present

## 2016-08-30 DIAGNOSIS — I10 Essential (primary) hypertension: Secondary | ICD-10-CM | POA: Diagnosis not present

## 2016-08-30 DIAGNOSIS — E782 Mixed hyperlipidemia: Secondary | ICD-10-CM | POA: Diagnosis not present

## 2016-08-30 DIAGNOSIS — E109 Type 1 diabetes mellitus without complications: Secondary | ICD-10-CM | POA: Diagnosis not present

## 2016-10-11 ENCOUNTER — Encounter: Payer: Self-pay | Admitting: Internal Medicine

## 2016-10-11 ENCOUNTER — Ambulatory Visit (INDEPENDENT_AMBULATORY_CARE_PROVIDER_SITE_OTHER): Payer: 59 | Admitting: Internal Medicine

## 2016-10-11 VITALS — BP 112/76 | HR 60 | Temp 97.3°F | Resp 18 | Ht 64.5 in | Wt 148.6 lb

## 2016-10-11 DIAGNOSIS — Z79899 Other long term (current) drug therapy: Secondary | ICD-10-CM | POA: Diagnosis not present

## 2016-10-11 DIAGNOSIS — E139 Other specified diabetes mellitus without complications: Secondary | ICD-10-CM

## 2016-10-11 DIAGNOSIS — E782 Mixed hyperlipidemia: Secondary | ICD-10-CM | POA: Diagnosis not present

## 2016-10-11 DIAGNOSIS — I10 Essential (primary) hypertension: Secondary | ICD-10-CM

## 2016-10-11 DIAGNOSIS — E109 Type 1 diabetes mellitus without complications: Secondary | ICD-10-CM | POA: Diagnosis not present

## 2016-10-11 DIAGNOSIS — E559 Vitamin D deficiency, unspecified: Secondary | ICD-10-CM | POA: Diagnosis not present

## 2016-10-11 DIAGNOSIS — E039 Hypothyroidism, unspecified: Secondary | ICD-10-CM

## 2016-10-11 LAB — HEPATIC FUNCTION PANEL
ALT: 19 U/L (ref 6–29)
AST: 20 U/L (ref 10–35)
Albumin: 4.1 g/dL (ref 3.6–5.1)
Alkaline Phosphatase: 63 U/L (ref 33–130)
Bilirubin, Direct: 0.1 mg/dL (ref <=0.2)
Indirect Bilirubin: 0.3 mg/dL (ref 0.2–1.2)
TOTAL PROTEIN: 6.5 g/dL (ref 6.1–8.1)
Total Bilirubin: 0.4 mg/dL (ref 0.2–1.2)

## 2016-10-11 LAB — CBC WITH DIFFERENTIAL/PLATELET
BASOS ABS: 66 {cells}/uL (ref 0–200)
Basophils Relative: 1 %
Eosinophils Absolute: 132 cells/uL (ref 15–500)
Eosinophils Relative: 2 %
HEMATOCRIT: 44 % (ref 35.0–45.0)
HEMOGLOBIN: 14.5 g/dL (ref 11.7–15.5)
LYMPHS ABS: 1980 {cells}/uL (ref 850–3900)
Lymphocytes Relative: 30 %
MCH: 28.9 pg (ref 27.0–33.0)
MCHC: 33 g/dL (ref 32.0–36.0)
MCV: 87.8 fL (ref 80.0–100.0)
MONO ABS: 528 {cells}/uL (ref 200–950)
MPV: 11.3 fL (ref 7.5–12.5)
Monocytes Relative: 8 %
Neutro Abs: 3894 cells/uL (ref 1500–7800)
Neutrophils Relative %: 59 %
Platelets: 249 10*3/uL (ref 140–400)
RBC: 5.01 MIL/uL (ref 3.80–5.10)
RDW: 12.9 % (ref 11.0–15.0)
WBC: 6.6 10*3/uL (ref 3.8–10.8)

## 2016-10-11 LAB — LIPID PANEL
CHOLESTEROL: 171 mg/dL (ref <200)
HDL: 78 mg/dL (ref >50)
LDL CALC: 80 mg/dL (ref <100)
Total CHOL/HDL Ratio: 2.2 Ratio (ref <5.0)
Triglycerides: 66 mg/dL (ref <150)
VLDL: 13 mg/dL (ref <30)

## 2016-10-11 LAB — BASIC METABOLIC PANEL WITH GFR
BUN: 21 mg/dL (ref 7–25)
CALCIUM: 9.7 mg/dL (ref 8.6–10.4)
CO2: 26 mmol/L (ref 20–32)
CREATININE: 0.8 mg/dL (ref 0.50–1.05)
Chloride: 103 mmol/L (ref 98–110)
GFR, Est African American: >89 mL/min (ref >=60)
GFR, Est Non African American: 81 mL/min (ref >=60)
GLUCOSE: 100 mg/dL — AB (ref 65–99)
Potassium: 4.4 mmol/L (ref 3.5–5.3)
Sodium: 140 mmol/L (ref 135–146)

## 2016-10-11 LAB — TSH: TSH: 6.01 m[IU]/L — AB

## 2016-10-11 NOTE — Progress Notes (Signed)
This very nice 61 y.o. MWF presents for 6 month follow up with Hypertension, Hyperlipidemia, LADAand Vitamin D Deficiency.      Patient is treated for HTN (2004)  & BP has been controlled at home. Today's BP is at goal  - 112/76. Patient has had no complaints of any cardiac type chest pain, palpitations, dyspnea/orthopnea/PND, dizziness, claudication, or dependent edema.     Hyperlipidemia is controlled with diet & meds. Patient denies myalgias or other med SE's. Last Lipids were at goal: Lab Results  Component Value Date   CHOL 147 07/10/2016   HDL 67 07/10/2016   LDLCALC 64 07/10/2016   TRIG 81 07/10/2016   CHOLHDL 2.2 07/10/2016      Also, the patient has history of  Glucose intolerance since 2003, initiating Metformin in 2014 and then Dx'd with LADA & starting insulin in 2017 by Dr [name]. She symptoms of reactive hypoglycemia, diabetic polys, paresthesias or visual blurring.  Last A1c 6.1% was not at goal in May 2018. She relates recent a1c 6.2% 3 weeks ago at Dr [name]'s office.      Patient was tx'd w/RAI-131*  for Graves Dz in 2008 and has been on Replacement since. Further, the patient also has history of Vitamin D Deficiency ("25" in 2008)  and supplements vitamin D without any suspected side-effects. Last vitamin D was at goal:  Lab Results  Component Value Date   VD25OH 74 07/10/2016   Current Outpatient Prescriptions on File Prior to Visit  Medication Sig  . aspirin EC 81 MG tablet Take 1 tablet  daily.  Marland Kitchen atorvastatin  80 MG tablet Take 1 tab h daily.  Marland Kitchen VITAMIN D  Take 6,000 Units daily.   Marland Kitchen estradiol 2 MG  Take  daily.   Marland Kitchen HEMOCYTE - 106 MG FE Take 1 tablet  daily.  Marland Kitchen NOVOLOG FLEXPEN Inject into the skin. Sliding scale  . TOUJEO SOLOSTAR injects 15 units in the AM.  . lisinopril 10 MG tablet TAKE 1/2-1 TAB EVERYDAY  . Magnesium 500 MG  Take  daily.  Marland Kitchen PROVERA 5 MG t Take 1 tablet 10 days every 3 months  . Omega-3 FISH OIL 1200 MG Take 1 cap 2 x daily.  Marland Kitchen  levothyroxine  200 MCG   Takes 1 tab on M,W and 1/2 tab the other 5 days of the week)   No Known Allergies   PMHx:   Past Medical History:  Diagnosis Date  . Diabetes mellitus without complication ([place])   . Hyperlipidemia   . Hypertension   . Iron deficiency anemia 01/17/2011  . Thyroid disease   . Type II or unspecified type diabetes mellitus without mention of complication, not stated as uncontrolled 02/04/2013  . Varicose veins   . Vitamin D deficiency    Immunization History  Administered Date(s) Administered  . Influenza Split 12/06/2013, 12/08/2014  . Influenza Whole 01/28/2012  . Influenza-Unspecified 12/04/2015  . PPD Test 02/08/2014, 03/15/2015, 04/05/2016  . Pneumococcal Polysaccharide-23 12/08/2014  . Td 04/05/2016  . Tdap 03/04/2006   Past Surgical History:  Procedure Laterality Date  . VEIN LIGATION AND STRIPPING Left    FHx:    Reviewed / unchanged  SHx:    Reviewed / unchanged  Systems Review:  Constitutional: Denies fever, chills, wt changes, headaches, insomnia, fatigue, night sweats, change in appetite. Eyes: Denies redness, blurred vision, diplopia, discharge, itchy, watery eyes.  ENT: Denies discharge, congestion, post nasal drip, epistaxis, sore throat, earache, hearing loss,  dental pain, tinnitus, vertigo, sinus pain, snoring.  CV: Denies chest pain, palpitations, irregular heartbeat, syncope, dyspnea, diaphoresis, orthopnea, PND, claudication or edema. Respiratory: denies cough, dyspnea, DOE, pleurisy, hoarseness, laryngitis, wheezing.  Gastrointestinal: Denies dysphagia, odynophagia, heartburn, reflux, water brash, abdominal pain or cramps, nausea, vomiting, bloating, diarrhea, constipation, hematemesis, melena, hematochezia  or hemorrhoids. Genitourinary: Denies dysuria, frequency, urgency, nocturia, hesitancy, discharge, hematuria or flank pain. Musculoskeletal: Denies arthralgias, myalgias, stiffness, jt. swelling, pain, limping or strain/sprain.    Skin: Denies pruritus, rash, hives, warts, acne, eczema or change in skin lesion(s). Neuro: No weakness, tremor, incoordination, spasms, paresthesia or pain. Psychiatric: Denies confusion, memory loss or sensory loss. Endo: Denies change in weight, skin or hair change.  Heme/Lymph: No excessive bleeding, bruising or enlarged lymph nodes.  Physical Exam  BP 112/76   Pulse 60   Temp (!) 97.3 F (36.3 C)   Resp 18   Ht 5' 4.5" (1.638 m)   Wt 148 lb 9.6 oz (67.4 kg)   LMP 08/11/2013 Comment: spotting  BMI 25.11 kg/m   Appears well nourished, well groomed  and in no distress.  Eyes: PERRLA, EOMs, conjunctiva no swelling or erythema. Sinuses: No frontal/maxillary tenderness ENT/Mouth: EAC's clear, TM's nl w/o erythema, bulging. Nares clear w/o erythema, swelling, exudates. Oropharynx clear without erythema or exudates. Oral hygiene is good. Tongue normal, non obstructing. Hearing intact.  Neck: Supple. Thyroid nl. Car 2+/2+ without bruits, nodes or JVD. Chest: Respirations nl with BS clear & equal w/o rales, rhonchi, wheezing or stridor.  Cor: Heart sounds normal w/ regular rate and rhythm without sig. murmurs, gallops, clicks or rubs. Peripheral pulses normal and equal  without edema.  Abdomen: Soft & bowel sounds normal. Non-tender w/o guarding, rebound, hernias, masses or organomegaly.  Lymphatics: Unremarkable.  Musculoskeletal: Full ROM all peripheral extremities, joint stability, 5/5 strength and normal gait.  Skin: Warm, dry without exposed rashes, lesions or ecchymosis apparent.  Neuro: Cranial nerves intact, reflexes equal bilaterally. Sensory-motor testing grossly intact. Tendon reflexes grossly intact.  Pysch: Alert & oriented x 3.  Insight and judgement nl & appropriate. No ideations.  Assessment and Plan:  1. Essential hypertension  - Continue medication, monitor blood pressure at home.  - Continue DASH diet. Reminder to go to the ER if any CP,  SOB, nausea,  dizziness, severe HA, changes vision/speech.  - CBC with Differential/Platelet - BASIC METABOLIC PANEL WITH GFR - Magnesium - TSH  2. Hyperlipidemia, mixed  - Continue diet/meds, exercise,& lifestyle modifications.  - Continue monitor periodic cholesterol/liver & renal functions   - Hepatic function panel - Lipid panel - TSH  3. LADA (latent autoimmune diabetes in adults), managed as type 1 ([place])   4. Vitamin D deficiency  - Continue diet, exercise, lifestyle modifications.  - Monitor appropriate labs. - Continue supplementation. - VITAMIN D 25 Hydroxy   5. Hypothyroidism  - TSH  6. Medication management  - CBC with Differential/Platelet - BASIC METABOLIC PANEL WITH GFR - Hepatic function panel - Magnesium - Lipid panel - TSH - VITAMIN D 25 Hydroxy         Discussed  regular exercise, BP monitoring, weight control to achieve/maintain BMI less than 25 and discussed med and SE's. Recommended labs to assess and monitor clinical status with further disposition pending results of labs. Over 30 minutes of exam, counseling, chart review was performed.

## 2016-10-11 NOTE — Patient Instructions (Signed)

## 2016-10-12 LAB — VITAMIN D 25 HYDROXY (VIT D DEFICIENCY, FRACTURES): VIT D 25 HYDROXY: 67 ng/mL (ref 30–100)

## 2016-10-12 LAB — MAGNESIUM: MAGNESIUM: 2.3 mg/dL (ref 1.5–2.5)

## 2016-10-14 ENCOUNTER — Other Ambulatory Visit: Payer: Self-pay | Admitting: Internal Medicine

## 2016-10-14 ENCOUNTER — Telehealth: Payer: Self-pay | Admitting: *Deleted

## 2016-10-14 DIAGNOSIS — E890 Postprocedural hypothyroidism: Secondary | ICD-10-CM

## 2016-10-14 MED ORDER — LEVOTHYROXINE SODIUM 137 MCG PO TABS: 137.0000 ug | ORAL_TABLET | Freq: Every day | ORAL | 1 refills | Status: DC

## 2016-10-14 NOTE — Telephone Encounter (Signed)
Per Dr Melford Aase, the patient was informed of her new Levothyroxine dose of 137 mcg daily.

## 2016-10-17 ENCOUNTER — Other Ambulatory Visit: Payer: Self-pay | Admitting: *Deleted

## 2016-10-17 DIAGNOSIS — E890 Postprocedural hypothyroidism: Secondary | ICD-10-CM

## 2016-10-17 MED ORDER — LEVOTHYROXINE SODIUM 137 MCG PO TABS: 137.0000 ug | ORAL_TABLET | Freq: Every day | ORAL | 1 refills | Status: DC

## 2016-11-25 DIAGNOSIS — E782 Mixed hyperlipidemia: Secondary | ICD-10-CM | POA: Diagnosis not present

## 2016-11-25 DIAGNOSIS — E109 Type 1 diabetes mellitus without complications: Secondary | ICD-10-CM | POA: Diagnosis not present

## 2016-11-29 DIAGNOSIS — I10 Essential (primary) hypertension: Secondary | ICD-10-CM | POA: Diagnosis not present

## 2016-11-29 DIAGNOSIS — Z23 Encounter for immunization: Secondary | ICD-10-CM | POA: Diagnosis not present

## 2016-11-29 DIAGNOSIS — E782 Mixed hyperlipidemia: Secondary | ICD-10-CM | POA: Diagnosis not present

## 2016-11-29 DIAGNOSIS — E109 Type 1 diabetes mellitus without complications: Secondary | ICD-10-CM | POA: Diagnosis not present

## 2016-12-04 ENCOUNTER — Other Ambulatory Visit: Payer: Self-pay | Admitting: Physician Assistant

## 2017-01-16 NOTE — Progress Notes (Addendum)
FOLLOW UP  Assessment and Plan:   Hypertension Well controlled with current medications Monitor blood pressure at home; patient to call if consistently greater than 130/80 Continue DASH diet.   Reminder to go to the ER if any CP, SOB, nausea, dizziness, severe HA, changes vision/speech, left arm numbness and tingling and jaw pain.  Cholesterol Continue medication Continue low cholesterol diet and exercise.  Check lipid panel.   Diabetes without complications Continue medications Continue diet and exercise.  Perform daily foot/skin check, notify office of any concerning changes.  Check A1C  Vitamin D Def/ osteoporosis prevention Continue supplementation Check Vit D level  Iron deficiency anemia Continue iron supplements; take with vitamin C CBC  Hypothyroidism continue medications the same (137 mcg daily) as adjusted at last visit pending lab results reminded to take on an empty stomach 30-44mins before food.  check TSH level  Continue diet and meds as discussed. Further disposition pending results of labs. Discussed med's effects and SE's.   Over 30 minutes of exam, counseling, chart review, and critical decision making was performed.   Future Appointments  Date Time Provider [place]  05/08/2017 10:00 AM Unk Pinto, MD [facility] None    ----------------------------------------------------------------------------------------------------------------------  HPI 61 y.o. female  presents for 3 month follow up on hypertension, cholesterol, diabetes, hypothyroid, iron deficiency anemia and vitamin D deficiency. She is following with endocrinology. She reports she has been doing well- she has received FreeStyle Libre continuous glucose monitoring device and will be starting to use it next week.   Her blood pressure has been controlled at home, today their BP is BP: 118/72  She does workout. She denies chest pain, shortness of breath, dizziness.   She is on  cholesterol medication and denies myalgias. Her cholesterol is at goal. The cholesterol last visit was:   Lab Results  Component Value Date   CHOL 171 10/11/2016   HDL 78 10/11/2016   LDLCALC 80 10/11/2016   TRIG 66 10/11/2016   CHOLHDL 2.2 10/11/2016    She has been working on diet and exercise for diabetes, and denies hyperglycemia, hypoglycemia , increased appetite, nausea, paresthesia of the feet, visual disturbances and vomiting. Last A1C in the office was:  Lab Results  Component Value Date   HGBA1C 6.1 (H) 07/10/2016   Patient is on Vitamin D supplement and at goal:    Lab Results  Component Value Date   VD25OH 67 10/11/2016     She is on thyroid medication. Her medication was changed last visit- increased to 137 mcg.    Lab Results  Component Value Date   TSH 6.01 (H) 10/11/2016  .   Current Medications:  Current Outpatient Medications on File Prior to Visit  Medication Sig  . aspirin EC 81 MG tablet Take 1 tablet (81 mg total) by mouth daily.  Marland Kitchen atorvastatin (LIPITOR) 80 MG tablet TAKE 1 TABLET BY MOUTH DAILY (Patient taking differently: TAKE 1/2 TABLET BY MOUTH DAILY)  . cholecalciferol (VITAMIN D) 1000 UNITS tablet Take 6,000 Units by mouth daily.   Marland Kitchen estradiol (ESTRACE) 2 MG tablet Take 2 mg by mouth daily.   . ferrous fumarate (HEMOCYTE - 106 MG FE) 325 (106 FE) MG TABS Take 1 tablet by mouth daily.  . Insulin Aspart (NOVOLOG FLEXPEN [place]) Inject into the skin. Sliding scale  . Insulin Glargine (TOUJEO SOLOSTAR [place]) Inject into the skin. Patient currently injects 18 units in the AM.  . levothyroxine (SYNTHROID) 137 MCG tablet Take 1 tablet (137 mcg total)  by mouth daily before breakfast.  . lisinopril (PRINIVIL,ZESTRIL) 10 MG tablet TAKE 1/2-1 TABLET BY MOUTH EVERYDAY  . Magnesium 500 MG CAPS Take 500 mg by mouth daily.  . medroxyPROGESTERone (PROVERA) 5 MG tablet Take 1 tablet 10 days every 3 months  . Omega-3 Fatty Acids (FISH OIL) 1200 MG CAPS Take 1 capsule (1,200  mg total) by mouth 2 (two) times daily.   No current facility-administered medications on file prior to visit.      Allergies: No Known Allergies   Medical History:  Past Medical History:  Diagnosis Date  . Diabetes mellitus without complication ([place])   . Hyperlipidemia   . Hypertension   . Iron deficiency anemia 01/17/2011  . Thyroid disease   . Type II or unspecified type diabetes mellitus without mention of complication, not stated as uncontrolled 02/04/2013  . Varicose veins   . Vitamin D deficiency    Family history- Reviewed and unchanged Social history- Reviewed and unchanged   Review of Systems:  Review of Systems  Constitutional: Negative for malaise/fatigue and weight loss.  HENT: Negative for hearing loss and tinnitus.   Eyes: Negative for blurred vision and double vision.  Respiratory: Negative for cough, shortness of breath and wheezing.   Cardiovascular: Negative for chest pain, palpitations, orthopnea, claudication and leg swelling.  Gastrointestinal: Negative for abdominal pain, blood in stool, constipation, diarrhea, heartburn, melena, nausea and vomiting.  Genitourinary: Negative.   Musculoskeletal: Negative for joint pain and myalgias.  Skin: Negative for rash.  Neurological: Negative for dizziness, tingling, sensory change, weakness and headaches.  Endo/Heme/Allergies: Negative for polydipsia.  Psychiatric/Behavioral: Negative.   All other systems reviewed and are negative.   Physical Exam: BP 118/72   Pulse 63   Temp (!) 97.5 F (36.4 C)   Ht 5' 4.5" (1.638 m)   Wt 147 lb 3.2 oz (66.8 kg)   LMP 08/11/2013 Comment: spotting  SpO2 98%   BMI 24.88 kg/m  Wt Readings from Last 3 Encounters:  01/17/17 147 lb 3.2 oz (66.8 kg)  10/11/16 148 lb 9.6 oz (67.4 kg)  07/10/16 151 lb (68.5 kg)   General Appearance: Well nourished, in no apparent distress. Eyes: PERRLA, EOMs, conjunctiva no swelling or erythema Sinuses: No Frontal/maxillary  tenderness ENT/Mouth: Ext aud canals clear, TMs without erythema, bulging. No erythema, swelling, or exudate on post pharynx.  Tonsils not swollen or erythematous. Hearing normal.  Neck: Supple, thyroid normal.  Respiratory: Respiratory effort normal, BS equal bilaterally without rales, rhonchi, wheezing or stridor.  Cardio: RRR with no RGs; mild 2/6 decreshendo early systolic murmur noted. Brisk peripheral pulses without edema.  Abdomen: Soft, + BS.  Non tender, no guarding, rebound, hernias, masses. Lymphatics: Non tender without lymphadenopathy.  Musculoskeletal: Full ROM, 5/5 strength, Normal gait Skin: Warm, dry without rashes, lesions, ecchymosis.  Neuro: Cranial nerves intact. No cerebellar symptoms.  Psych: Awake and oriented X 3, normal affect, Insight and Judgment appropriate.    Izora Ribas, NP 8:51 AM [facility]

## 2017-01-17 ENCOUNTER — Ambulatory Visit: Payer: 59 | Admitting: Adult Health

## 2017-01-17 ENCOUNTER — Encounter: Payer: Self-pay | Admitting: Adult Health

## 2017-01-17 VITALS — BP 118/72 | HR 63 | Temp 97.5°F | Ht 64.5 in | Wt 147.2 lb

## 2017-01-17 DIAGNOSIS — E109 Type 1 diabetes mellitus without complications: Secondary | ICD-10-CM

## 2017-01-17 DIAGNOSIS — E559 Vitamin D deficiency, unspecified: Secondary | ICD-10-CM | POA: Diagnosis not present

## 2017-01-17 DIAGNOSIS — E139 Other specified diabetes mellitus without complications: Secondary | ICD-10-CM

## 2017-01-17 DIAGNOSIS — D509 Iron deficiency anemia, unspecified: Secondary | ICD-10-CM | POA: Diagnosis not present

## 2017-01-17 DIAGNOSIS — E890 Postprocedural hypothyroidism: Secondary | ICD-10-CM

## 2017-01-17 DIAGNOSIS — E782 Mixed hyperlipidemia: Secondary | ICD-10-CM | POA: Diagnosis not present

## 2017-01-17 DIAGNOSIS — I10 Essential (primary) hypertension: Secondary | ICD-10-CM

## 2017-01-17 DIAGNOSIS — Z79899 Other long term (current) drug therapy: Secondary | ICD-10-CM

## 2017-01-17 NOTE — Patient Instructions (Signed)
Consider an [place]ron hand held fat monitor as a better indicator of health - they should be $30 or so on [place] and are decently accurate if you follow the instructions.    Here is some information to help you keep your heart healthy: Move it! - Aim for 30 mins of activity every day. Take it slowly at first. Talk to [place] before starting any new exercise program.   Lose it.  -Body Mass Index (BMI) can indicate if you need to lose weight. A healthy range is 18.5-24.9. For a BMI calculator, go to [employer]  Waist Management -Excess abdominal fat is a risk factor for heart disease, diabetes, asthma, stroke and more. Ideal waist circumference is less than 35" for women and less than 40" for men.   Eat Right -focus on fruits, vegetables, whole grains, and meals you make yourself. Avoid foods with trans fat and high sugar/sodium content.   Snooze or Snore? - Loud snoring can be a sign of sleep apnea, a significant risk factor for high blood pressure, heart attach, stroke, and heart arrhythmias.  Kick the habit -Quit Smoking! Avoid second hand smoke. A single cigarette raises your blood pressure for 20 mins and increases the risk of heart attack and stroke for the next 24 hours.   Are Aspirin and Supplements right for you? -Add ENTERIC COATED low dose 81 mg Aspirin daily OR can do every other day if you have easy bruising to protect your heart and head. As well as to reduce risk of Colon Cancer by 20 %, Skin Cancer by 26 % , Melanoma by 46% and Pancreatic cancer by 60%  Say "No to Stress -There may be little you can do about problems that cause stress. However, techniques such as long walks, meditation, and exercise can help you manage it.   Start Now! - Make changes one at a time and set reasonable goals to increase your likelihood of success.

## 2017-01-18 LAB — HEPATIC FUNCTION PANEL
AG RATIO: 1.9 (calc) (ref 1.0–2.5)
ALBUMIN MSPROF: 4 g/dL (ref 3.6–5.1)
ALT: 17 U/L (ref 6–29)
AST: 15 U/L (ref 10–35)
Alkaline phosphatase (APISO): 63 U/L (ref 33–130)
Bilirubin, Direct: 0.1 mg/dL (ref 0.0–0.2)
GLOBULIN: 2.1 g/dL (ref 1.9–3.7)
Indirect Bilirubin: 0.2 mg/dL (calc) (ref 0.2–1.2)
TOTAL PROTEIN: 6.1 g/dL (ref 6.1–8.1)
Total Bilirubin: 0.3 mg/dL (ref 0.2–1.2)

## 2017-01-18 LAB — CBC WITH DIFFERENTIAL/PLATELET
Basophils Absolute: 68 cells/uL (ref 0–200)
Basophils Relative: 1.1 %
Eosinophils Absolute: 62 cells/uL (ref 15–500)
Eosinophils Relative: 1 %
HCT: 43 % (ref 35.0–45.0)
Hemoglobin: 14.2 g/dL (ref 11.7–15.5)
Lymphs Abs: 1761 cells/uL (ref 850–3900)
MCH: 28.2 pg (ref 27.0–33.0)
MCHC: 33 g/dL (ref 32.0–36.0)
MCV: 85.5 fL (ref 80.0–100.0)
MPV: 11.6 fL (ref 7.5–12.5)
Monocytes Relative: 6.5 %
Neutro Abs: 3906 cells/uL (ref 1500–7800)
Neutrophils Relative %: 63 %
Platelets: 273 10*3/uL (ref 140–400)
RBC: 5.03 10*6/uL (ref 3.80–5.10)
RDW: 12 % (ref 11.0–15.0)
Total Lymphocyte: 28.4 %
WBC mixed population: 403 cells/uL (ref 200–950)
WBC: 6.2 10*3/uL (ref 3.8–10.8)

## 2017-01-18 LAB — LIPID PANEL
CHOL/HDL RATIO: 2.1 (calc) (ref <5.0)
CHOLESTEROL: 158 mg/dL (ref <200)
HDL: 77 mg/dL (ref >50)
LDL CHOLESTEROL (CALC): 64 mg/dL
NON-HDL CHOLESTEROL (CALC): 81 mg/dL (ref <130)
TRIGLYCERIDES: 85 mg/dL (ref <150)

## 2017-01-18 LAB — BASIC METABOLIC PANEL WITH GFR
BUN: 19 mg/dL (ref 7–25)
CO2: 28 mmol/L (ref 20–32)
Calcium: 9.7 mg/dL (ref 8.6–10.4)
Chloride: 106 mmol/L (ref 98–110)
Creat: 0.99 mg/dL (ref 0.50–1.05)
GFR, Est African American: 72 mL/min/{1.73_m2} (ref >=60)
GFR, Est Non African American: 62 mL/min/{1.73_m2} (ref >=60)
Glucose, Bld: 124 mg/dL — ABNORMAL HIGH (ref 65–99)
Potassium: 5.4 mmol/L — ABNORMAL HIGH (ref 3.5–5.3)
Sodium: 141 mmol/L (ref 135–146)

## 2017-01-18 LAB — HEMOGLOBIN A1C
Hgb A1c MFr Bld: 6.4 % of total Hgb — ABNORMAL HIGH (ref <5.7)
Mean Plasma Glucose: 137 (calc)
eAG (mmol/L): 7.6 (calc)

## 2017-01-18 LAB — VITAMIN D 25 HYDROXY (VIT D DEFICIENCY, FRACTURES): Vit D, 25-Hydroxy: 79 ng/mL (ref 30–100)

## 2017-01-18 LAB — TSH: TSH: 2.56 m[IU]/L (ref 0.40–4.50)

## 2017-01-19 ENCOUNTER — Other Ambulatory Visit: Payer: Self-pay | Admitting: Adult Health

## 2017-01-19 DIAGNOSIS — E875 Hyperkalemia: Secondary | ICD-10-CM

## 2017-02-04 ENCOUNTER — Other Ambulatory Visit: Payer: 59

## 2017-02-04 DIAGNOSIS — E875 Hyperkalemia: Secondary | ICD-10-CM

## 2017-02-04 LAB — BASIC METABOLIC PANEL WITH GFR
BUN: 23 mg/dL (ref 7–25)
CALCIUM: 9.6 mg/dL (ref 8.6–10.4)
CO2: 28 mmol/L (ref 20–32)
CREATININE: 0.75 mg/dL (ref 0.50–1.05)
Chloride: 105 mmol/L (ref 98–110)
GFR, EST NON AFRICAN AMERICAN: 87 mL/min/{1.73_m2} (ref >=60)
GFR, Est African American: 101 mL/min/{1.73_m2} (ref >=60)
GLUCOSE: 120 mg/dL — AB (ref 65–99)
Potassium: 4.6 mmol/L (ref 3.5–5.3)
Sodium: 140 mmol/L (ref 135–146)

## 2017-02-28 DIAGNOSIS — E782 Mixed hyperlipidemia: Secondary | ICD-10-CM | POA: Diagnosis not present

## 2017-02-28 DIAGNOSIS — E109 Type 1 diabetes mellitus without complications: Secondary | ICD-10-CM | POA: Diagnosis not present

## 2017-02-28 DIAGNOSIS — E559 Vitamin D deficiency, unspecified: Secondary | ICD-10-CM | POA: Diagnosis not present

## 2017-03-07 DIAGNOSIS — E109 Type 1 diabetes mellitus without complications: Secondary | ICD-10-CM | POA: Diagnosis not present

## 2017-03-07 DIAGNOSIS — I10 Essential (primary) hypertension: Secondary | ICD-10-CM | POA: Diagnosis not present

## 2017-03-07 DIAGNOSIS — E782 Mixed hyperlipidemia: Secondary | ICD-10-CM | POA: Diagnosis not present

## 2017-03-12 ENCOUNTER — Other Ambulatory Visit: Payer: Self-pay | Admitting: Physician Assistant

## 2017-03-31 DIAGNOSIS — S2231XA Fracture of one rib, right side, initial encounter for closed fracture: Secondary | ICD-10-CM | POA: Diagnosis not present

## 2017-04-18 ENCOUNTER — Other Ambulatory Visit: Payer: Self-pay | Admitting: Internal Medicine

## 2017-04-18 DIAGNOSIS — E890 Postprocedural hypothyroidism: Secondary | ICD-10-CM

## 2017-05-08 ENCOUNTER — Encounter: Payer: Self-pay | Admitting: Internal Medicine

## 2017-05-08 NOTE — Progress Notes (Signed)
Complete Physical  Assessment and Plan:  Diagnoses and all orders for this visit:  Encounter for general adult medical examination with abnormal findings  Essential hypertension At goal; continue medication Monitor blood pressure at home; call if consistently over 130/80 Continue DASH diet.   Reminder to go to the ER if any CP, SOB, nausea, dizziness, severe HA, changes vision/speech, left arm numbness and tingling and jaw pain. -     EKG 12-Lead  Varicose veins of lower extremities with complications, unspecified laterality S/p vein ligation and stripping; recommended wearing support hose; follow up vascular as needed -  Symptoms stable and well managed  Postablative hypothyroidism continue medications the same pending lab results reminded to take on an empty stomach 30-11mins before food.  check TSH level -     TSH  LADA (latent autoimmune diabetes in adults), managed as type 1 (Mary Frank) Followed by Dr. Elyse Frank Education: Reviewed 'ABCs' of diabetes management (respective goals in parentheses):  A1C (<7), blood pressure (<130/80), and cholesterol (LDL <70) Eye Exam yearly and Dental Exam every 6 months; reminded needs this year and to send report Dietary recommendations reviewed Physical Activity recommendations reviewed Foot exam performed -     Hemoglobin A1c  Iron deficiency anemia, unspecified iron deficiency anemia type On iron supplement-  -     CBC with Differential/Platelet -     Iron,Total/Total Iron Binding Cap  Systolic murmur ? New in the last year; no previous documentation; discussed risks/benefits, possible ECHO; otherwise asymptomatic, would like to postpone at this time.   Medication management -     CBC with Differential/Platelet -     BASIC METABOLIC PANEL WITH GFR -     Hepatic function panel  Mixed hyperlipidemia Continue medications: atorvastatin Continue low cholesterol diet and exercise.  Check lipid panel.  -     Lipid panel  Vitamin D  deficiency At goal at recent check; continue to recommend supplementation for goal of 70-100 Defer vitamin D level  BMI 24.0-24.9, adult  Screening for hematuria or proteinuria -     Microalbumin / creatinine urine ratio -     Urinalysis w microscopic + reflex cultur  Screening for deficiency anemia -     Vitamin B12   Discussed med's effects and SE's. Screening labs and tests as requested with regular follow-up as recommended. Over 40 minutes of exam, counseling, chart review, and complex, high level critical decision making was performed this visit.   Future Appointments  Date Time Provider [place]  05/15/2018  9:30 AM Mary Comber, NP [facility] None     HPI  62 y.o. very pleasant married Caucasian female, works in [employer], mother of 2 -  presents for a complete physical and follow up for has Iron deficiency anemia; Essential hypertension; Mixed hyperlipidemia; LADA (latent autoimmune diabetes in adults), managed as type 1 (Mary Frank); Hypothyroidism; Vitamin D deficiency; Medication management; and Varicose veins of lower extremities with complications on their problem list.. She is followed by Dr Mary Frank for LADA as she was found to have elevated insulin antibodies.     Patient take a base of 18 units of Toujeo daily and covers bid with small doses (1 unit) of Novolog if glucose is >120 (which apparently is infrequent). She has newly acquired the [employer] reader and is pleased/doing well with this. Followed by Dr. Nori Frank for GYN and mammograms.   BMI is Body mass index is 24.34 kg/m., she has been working on diet and exercise. Works out for an hour  6 days a week and goes dancing 1 day a week.  Wt Readings from Last 3 Encounters:  05/09/17 144 lb (65.3 kg)  01/17/17 147 lb 3.2 oz (66.8 kg)  10/11/16 148 lb 9.6 oz (67.4 kg)   Her blood pressure has been controlled at home, today their BP is BP: 120/76 She does workout. She denies chest pain, shortness of breath,  dizziness.   She is on cholesterol medication (atorvastatin) and denies myalgias. Her cholesterol is at goal. The cholesterol last visit was:   Lab Results  Component Value Date   CHOL 158 01/17/2017   HDL 77 01/17/2017   LDLCALC 80 10/11/2016   TRIG 85 01/17/2017   CHOLHDL 2.1 01/17/2017   She has been working on diet and exercise for LADA diabetes, she is on bASA, she is on ACE/ARB and denies foot ulcerations, hyperglycemia, increased appetite, nausea, paresthesia of the feet, polydipsia, polyuria, visual disturbances, vomiting and weight loss. She does have some hypoglycemia; monitored by freestyle libre. She does have hypoglycemia awareness with shakiness/weakness, - glucose was in the 50s - but reports this is rare and otherwise hadn't had in a long time. Last A1C in the office was:  Lab Results  Component Value Date   HGBA1C 6.4 (H) 01/17/2017   Last GFR: Lab Results  Component Value Date   GFRNONAA 87 02/04/2017   Patient is on Vitamin D supplement and at goal at recent check:    Lab Results  Component Value Date   VD25OH 79 01/17/2017      Current Medications:  Current Outpatient Medications on File Prior to Visit  Medication Sig Dispense Refill  . aspirin EC 81 MG tablet Take 1 tablet (81 mg total) by mouth daily. 150 tablet 2  . atorvastatin (LIPITOR) 80 MG tablet TAKE 1 TABLET BY MOUTH DAILY (Patient taking differently: TAKE 40MG  TABLET BY MOUTH DAILY) 30 tablet 1  . cholecalciferol (VITAMIN D) 1000 UNITS tablet Take 6,000 Units by mouth daily.     Marland Kitchen estradiol (ESTRACE) 2 MG tablet Take 2 mg by mouth daily.     . ferrous fumarate (HEMOCYTE - 106 MG FE) 325 (106 FE) MG TABS Take 1 tablet by mouth daily.    . Insulin Aspart (NOVOLOG FLEXPEN [place]) Inject into the skin. Sliding scale    . Insulin Glargine (TOUJEO SOLOSTAR [place]) Inject into the skin. Patient currently injects 18 units in the AM.    . levothyroxine (SYNTHROID, LEVOTHROID) 137 MCG tablet TAKE 1 TABLET BY MOUTH  DAILY BEFORE BREAKFAST 90 tablet 1  . lisinopril (PRINIVIL,ZESTRIL) 10 MG tablet TAKE 1/2-1 TABLET BY MOUTH EVERYDAY 90 tablet 1  . Magnesium 500 MG CAPS Take 500 mg by mouth daily.    . medroxyPROGESTERone (PROVERA) 5 MG tablet Take 1 tablet 10 days every 3 months    . Omega-3 Fatty Acids (FISH OIL) 1200 MG CAPS Take 1 capsule (1,200 mg total) by mouth 2 (two) times daily.     No current facility-administered medications on file prior to visit.    Allergies:  No Known Allergies Medical History:  She has Iron deficiency anemia; Essential hypertension; Mixed hyperlipidemia; LADA (latent autoimmune diabetes in adults), managed as type 1 ([place]); Hypothyroidism; Vitamin D deficiency; Medication management; and Varicose veins of lower extremities with complications on their problem list. Health Maintenance:   Immunization History  Administered Date(s) Administered  . Influenza Split 12/06/2013, 12/08/2014  . Influenza Whole 01/28/2012  . Influenza-Unspecified 12/04/2015, 12/17/2016  . PPD Test 02/08/2014, 03/15/2015,  04/05/2016  . Pneumococcal Polysaccharide-23 12/08/2014  . Td 04/05/2016  . Tdap 03/04/2006    Tetanus: 2018 Pneumovax: 2016 Prevnar 13: - Flu vaccine:2018 Shingrix: n/a in office PPD: 2018  LMP: Postmenopausal Pap: 2018 -with Dr. Nori Frank  MGM: 02/2017 - need reports, have requested  DEXA: 2006 normal Colonoscopy: 2010 - 5 year - overdue -  EGD: -  Last Dental Exam: Dr. Marland Kitchen q 6 months Last Eye Exam: Dr. Allean Found, 2018 needs diabetic eye report  Foot exam: Done today  Patient Care Team: Unk Pinto, MD as PCP - General  Surgical History:  She has a past surgical history that includes Vein ligation and stripping (Left, 2017); Vein ligation and stripping (Right, 2017); and Endometrial ablation w/ novasure (2015). Family History:  Herfamily history includes Anxiety disorder in her daughter; Arrhythmia in her mother; Breast cancer in her maternal grandmother; Colitis in  her daughter; Depression in her son; Diabetes in her mother; Fibromyalgia in her mother; Osteoporosis in her mother and sister; Thyroid disease in her father and sister. Social History:  She reports that she quit smoking about 7 years ago. She has a 20.00 pack-year smoking history. she has never used smokeless tobacco. She reports that she drinks about 1.2 oz of alcohol per week. She reports that she does not use drugs.  Review of Systems: Review of Systems  Constitutional: Negative for malaise/fatigue and weight loss.  HENT: Negative for hearing loss and tinnitus.   Eyes: Negative for blurred vision and double vision.  Respiratory: Negative for cough, shortness of breath and wheezing.   Cardiovascular: Negative for chest pain, palpitations, orthopnea, claudication and leg swelling.  Gastrointestinal: Negative for abdominal pain, blood in stool, constipation, diarrhea, heartburn, melena, nausea and vomiting.  Genitourinary: Negative.   Musculoskeletal: Negative for joint pain and myalgias.  Skin: Negative for rash.  Neurological: Negative for dizziness, tingling, sensory change, weakness and headaches.  Endo/Heme/Allergies: Negative for polydipsia.  Psychiatric/Behavioral: Negative.   All other systems reviewed and are negative.   Physical Exam: Estimated body mass index is 24.34 kg/m as calculated from the following:   Height as of this encounter: 5' 4.5" (1.638 m).   Weight as of this encounter: 144 lb (65.3 kg). BP 120/76   Pulse 62   Temp 97.7 F (36.5 C)   Ht 5' 4.5" (1.638 m)   Wt 144 lb (65.3 kg)   LMP 08/11/2013 Comment: spotting  SpO2 98%   BMI 24.34 kg/m  General Appearance: Well nourished, in no apparent distress.  Eyes: PERRLA, EOMs, conjunctiva no swelling or erythema, normal fundi and vessels.  Sinuses: No Frontal/maxillary tenderness  ENT/Mouth: Ext aud canals clear, normal light reflex with TMs without erythema, bulging. Good dentition. No erythema, swelling, or  exudate on post pharynx. Tonsils not swollen or erythematous. Hearing normal.  Neck: Supple, thyroid normal. No bruits  Respiratory: Respiratory effort normal, BS equal bilaterally without rales, rhonchi, wheezing or stridor.  Cardio: RRR with 3/6 early systolic blowing murmur without radiation, no rubs or gallops. Brisk peripheral pulses without edema.  Chest: symmetric, with normal excursions and percussion.  Breasts: Defer to GYN  Abdomen: Soft, nontender, no guarding, rebound, hernias, masses, or organomegaly.  Lymphatics: Non tender without lymphadenopathy.  Genitourinary: Defer to GYN Musculoskeletal: Full ROM all peripheral extremities,5/5 strength, and normal gait.  Skin: Warm, dry without rashes, lesions, ecchymosis. Neuro: Cranial nerves intact, reflexes equal bilaterally. Normal muscle tone, no cerebellar symptoms. Sensation intact.  Psych: Awake and oriented X 3, normal affect, Insight and  Judgment appropriate.   EKG: WNL no ST changes.  Mary Frank 9:44 AM [facility]

## 2017-05-09 ENCOUNTER — Encounter: Payer: Self-pay | Admitting: Adult Health

## 2017-05-09 ENCOUNTER — Ambulatory Visit: Payer: 59 | Admitting: Adult Health

## 2017-05-09 VITALS — BP 120/76 | HR 62 | Temp 97.7°F | Ht 64.5 in | Wt 144.0 lb

## 2017-05-09 DIAGNOSIS — Z86010 Personal history of colonic polyps: Secondary | ICD-10-CM

## 2017-05-09 DIAGNOSIS — E109 Type 1 diabetes mellitus without complications: Secondary | ICD-10-CM | POA: Diagnosis not present

## 2017-05-09 DIAGNOSIS — Z0001 Encounter for general adult medical examination with abnormal findings: Secondary | ICD-10-CM

## 2017-05-09 DIAGNOSIS — Z130 Encounter for screening for diseases of the blood and blood-forming organs and certain disorders involving the immune mechanism: Secondary | ICD-10-CM

## 2017-05-09 DIAGNOSIS — I83899 Varicose veins of unspecified lower extremities with other complications: Secondary | ICD-10-CM

## 2017-05-09 DIAGNOSIS — Z0000 Encounter for general adult medical examination without abnormal findings: Secondary | ICD-10-CM

## 2017-05-09 DIAGNOSIS — Z1389 Encounter for screening for other disorder: Secondary | ICD-10-CM

## 2017-05-09 DIAGNOSIS — E782 Mixed hyperlipidemia: Secondary | ICD-10-CM

## 2017-05-09 DIAGNOSIS — Z79899 Other long term (current) drug therapy: Secondary | ICD-10-CM

## 2017-05-09 DIAGNOSIS — Z136 Encounter for screening for cardiovascular disorders: Secondary | ICD-10-CM | POA: Diagnosis not present

## 2017-05-09 DIAGNOSIS — I10 Essential (primary) hypertension: Secondary | ICD-10-CM

## 2017-05-09 DIAGNOSIS — E890 Postprocedural hypothyroidism: Secondary | ICD-10-CM

## 2017-05-09 DIAGNOSIS — K635 Polyp of colon: Secondary | ICD-10-CM

## 2017-05-09 DIAGNOSIS — Z6824 Body mass index (BMI) 24.0-24.9, adult: Secondary | ICD-10-CM

## 2017-05-09 DIAGNOSIS — D509 Iron deficiency anemia, unspecified: Secondary | ICD-10-CM

## 2017-05-09 DIAGNOSIS — E559 Vitamin D deficiency, unspecified: Secondary | ICD-10-CM

## 2017-05-09 DIAGNOSIS — E139 Other specified diabetes mellitus without complications: Secondary | ICD-10-CM

## 2017-05-09 DIAGNOSIS — R011 Cardiac murmur, unspecified: Secondary | ICD-10-CM

## 2017-05-09 NOTE — Patient Instructions (Signed)
Aim for 7+ servings of fruits and vegetables daily  80+ fluid ounces of water or unsweet tea for healthy kidneys  Limit alcohol intake  Limit animal fats in diet for cholesterol and heart health - choose grass fed whenever available  Aim for low stress - take time to unwind and care for your mental health  Aim for 150 min of moderate intensity exercise weekly for heart health, and weights twice weekly for bone health  Aim for 7-9 hours of sleep daily  Here is some information to help you keep your heart healthy: Move it! - Aim for 30 mins of activity every day. Take it slowly at first. Talk to [place] before starting any new exercise program.   Lose it.  -Body Mass Index (BMI) can indicate if you need to lose weight. A healthy range is 18.5-24.9. For a BMI calculator, go to [employer]  Waist Management -Excess abdominal fat is a risk factor for heart disease, diabetes, asthma, stroke and more. Ideal waist circumference is less than 35" for women and less than 40" for men.   Eat Right -focus on fruits, vegetables, whole grains, and meals you make yourself. Avoid foods with trans fat and high sugar/sodium content.   Snooze or Snore? - Loud snoring can be a sign of sleep apnea, a significant risk factor for high blood pressure, heart attach, stroke, and heart arrhythmias.  Kick the habit -Quit Smoking! Avoid second hand smoke. A single cigarette raises your blood pressure for 20 mins and increases the risk of heart attack and stroke for the next 24 hours.   Are Aspirin and Supplements right for you? -Add ENTERIC COATED low dose 81 mg Aspirin daily OR can do every other day if you have easy bruising to protect your heart and head. As well as to reduce risk of Colon Cancer by 20 %, Skin Cancer by 26 % , Melanoma by 46% and Pancreatic cancer by 60%  Say "No to Stress -There may be little you can do about problems that cause stress. However, techniques such as long walks, meditation, and  exercise can help you manage it.   Start Now! - Make changes one at a time and set reasonable goals to increase your likelihood of success.

## 2017-05-10 LAB — CBC WITH DIFFERENTIAL/PLATELET
BASOS PCT: 1.1 %
Basophils Absolute: 52 cells/uL (ref 0–200)
EOS ABS: 52 {cells}/uL (ref 15–500)
Eosinophils Relative: 1.1 %
HCT: 42.7 % (ref 35.0–45.0)
HEMOGLOBIN: 14.2 g/dL (ref 11.7–15.5)
Lymphs Abs: 1509 cells/uL (ref 850–3900)
MCH: 28.3 pg (ref 27.0–33.0)
MCHC: 33.3 g/dL (ref 32.0–36.0)
MCV: 85.1 fL (ref 80.0–100.0)
MPV: 11.7 fL (ref 7.5–12.5)
Monocytes Relative: 12.9 %
NEUTROS ABS: 2482 {cells}/uL (ref 1500–7800)
Neutrophils Relative %: 52.8 %
PLATELETS: 247 10*3/uL (ref 140–400)
RBC: 5.02 10*6/uL (ref 3.80–5.10)
RDW: 11.8 % (ref 11.0–15.0)
TOTAL LYMPHOCYTE: 32.1 %
WBC mixed population: 606 cells/uL (ref 200–950)
WBC: 4.7 10*3/uL (ref 3.8–10.8)

## 2017-05-10 LAB — URINALYSIS W MICROSCOPIC + REFLEX CULTURE
Bacteria, UA: NONE SEEN /HPF
Bilirubin Urine: NEGATIVE
GLUCOSE, UA: NEGATIVE
Hgb urine dipstick: NEGATIVE
Hyaline Cast: NONE SEEN /LPF
Ketones, ur: NEGATIVE
LEUKOCYTE ESTERASE: NEGATIVE
NITRITES URINE, INITIAL: NEGATIVE
PH: 6.5 (ref 5.0–8.0)
Protein, ur: NEGATIVE
RBC / HPF: NONE SEEN /HPF (ref 0–2)
Specific Gravity, Urine: 1.009 (ref 1.001–1.03)

## 2017-05-10 LAB — BASIC METABOLIC PANEL WITH GFR
BUN: 17 mg/dL (ref 7–25)
CALCIUM: 9.6 mg/dL (ref 8.6–10.4)
CO2: 29 mmol/L (ref 20–32)
Chloride: 104 mmol/L (ref 98–110)
Creat: 0.81 mg/dL (ref 0.50–1.05)
GFR, EST AFRICAN AMERICAN: 92 mL/min/{1.73_m2} (ref >=60)
GFR, EST NON AFRICAN AMERICAN: 79 mL/min/{1.73_m2} (ref >=60)
Glucose, Bld: 61 mg/dL — ABNORMAL LOW (ref 65–99)
Potassium: 4.7 mmol/L (ref 3.5–5.3)
Sodium: 141 mmol/L (ref 135–146)

## 2017-05-10 LAB — LIPID PANEL
CHOL/HDL RATIO: 2.1 (calc) (ref <5.0)
CHOLESTEROL: 149 mg/dL (ref <200)
HDL: 70 mg/dL (ref >50)
LDL Cholesterol (Calc): 64 mg/dL (calc)
Non-HDL Cholesterol (Calc): 79 mg/dL (calc) (ref <130)
TRIGLYCERIDES: 74 mg/dL (ref <150)

## 2017-05-10 LAB — HEPATIC FUNCTION PANEL
AG Ratio: 1.8 (calc) (ref 1.0–2.5)
ALT: 25 U/L (ref 6–29)
AST: 20 U/L (ref 10–35)
Albumin: 4.1 g/dL (ref 3.6–5.1)
Alkaline phosphatase (APISO): 68 U/L (ref 33–130)
BILIRUBIN INDIRECT: 0.2 mg/dL (ref 0.2–1.2)
Bilirubin, Direct: 0.1 mg/dL (ref 0.0–0.2)
GLOBULIN: 2.3 g/dL (ref 1.9–3.7)
Total Bilirubin: 0.3 mg/dL (ref 0.2–1.2)
Total Protein: 6.4 g/dL (ref 6.1–8.1)

## 2017-05-10 LAB — IRON, TOTAL/TOTAL IRON BINDING CAP
%SAT: 22 % (ref 11–50)
Iron: 61 ug/dL (ref 45–160)
TIBC: 273 ug/dL (ref 250–450)

## 2017-05-10 LAB — HEMOGLOBIN A1C
EAG (MMOL/L): 6.8 (calc)
Hgb A1c MFr Bld: 5.9 % of total Hgb — ABNORMAL HIGH (ref <5.7)
MEAN PLASMA GLUCOSE: 123 (calc)

## 2017-05-10 LAB — MICROALBUMIN / CREATININE URINE RATIO
CREATININE, URINE: 30 mg/dL (ref 20–275)
MICROALB/CREAT RATIO: 10 ug/mg{creat} (ref <30)
Microalb, Ur: 0.3 mg/dL

## 2017-05-10 LAB — TSH: TSH: 0.96 m[IU]/L (ref 0.40–4.50)

## 2017-05-10 LAB — VITAMIN B12: Vitamin B-12: 1143 pg/mL — ABNORMAL HIGH (ref 200–1100)

## 2017-05-10 LAB — NO CULTURE INDICATED

## 2017-05-13 ENCOUNTER — Encounter: Payer: Self-pay | Admitting: *Deleted

## 2017-05-21 ENCOUNTER — Encounter: Payer: Self-pay | Admitting: *Deleted

## 2017-06-03 DIAGNOSIS — E109 Type 1 diabetes mellitus without complications: Secondary | ICD-10-CM | POA: Diagnosis not present

## 2017-06-03 DIAGNOSIS — E782 Mixed hyperlipidemia: Secondary | ICD-10-CM | POA: Diagnosis not present

## 2017-06-06 DIAGNOSIS — E109 Type 1 diabetes mellitus without complications: Secondary | ICD-10-CM | POA: Diagnosis not present

## 2017-06-06 DIAGNOSIS — E782 Mixed hyperlipidemia: Secondary | ICD-10-CM | POA: Diagnosis not present

## 2017-06-06 DIAGNOSIS — I10 Essential (primary) hypertension: Secondary | ICD-10-CM | POA: Diagnosis not present

## 2017-06-16 ENCOUNTER — Encounter: Payer: Self-pay | Admitting: Adult Health

## 2017-07-02 ENCOUNTER — Other Ambulatory Visit: Payer: Self-pay | Admitting: Physician Assistant

## 2017-07-14 ENCOUNTER — Other Ambulatory Visit: Payer: Self-pay | Admitting: Adult Health

## 2017-08-25 DIAGNOSIS — E109 Type 1 diabetes mellitus without complications: Secondary | ICD-10-CM | POA: Diagnosis not present

## 2017-08-25 DIAGNOSIS — E782 Mixed hyperlipidemia: Secondary | ICD-10-CM | POA: Diagnosis not present

## 2017-08-26 DIAGNOSIS — Z01419 Encounter for gynecological examination (general) (routine) without abnormal findings: Secondary | ICD-10-CM | POA: Diagnosis not present

## 2017-08-26 DIAGNOSIS — Z6824 Body mass index (BMI) 24.0-24.9, adult: Secondary | ICD-10-CM | POA: Diagnosis not present

## 2017-09-08 NOTE — Progress Notes (Signed)
FOLLOW UP  Assessment and Plan:   Hypertension Well controlled without medications Monitor blood pressure at home; patient to call if consistently greater than 130/80 Continue DASH diet.   Reminder to go to the ER if any CP, SOB, nausea, dizziness, severe HA, changes vision/speech, left arm numbness and tingling and jaw pain.  Cholesterol Continue medication Continue low cholesterol diet and exercise.  Check lipid panel.   Diabetes without complications Continue medications Continue diet and exercise.  Perform daily foot/skin check, notify office of any concerning changes.  Monitored by endocrinology; reported A1C 5.6 - defer checking to CPE  Vitamin D Def/ osteoporosis prevention Recent check at goal; Continue supplementation Defer Vit D level  Hypothyroidism continue medications the same  pending lab results reminded to take on an empty stomach 30-64mins before food.  check TSH level  Continue diet and meds as discussed. Further disposition pending results of labs. Discussed med's effects and SE's.   Over 30 minutes of exam, counseling, chart review, and critical decision making was performed.   Future Appointments  Date Time Provider [place]  09/09/2017  8:45 AM [name], NP [facility] None  05/15/2018  9:30 AM [name], NP [facility] None    ----------------------------------------------------------------------------------------------------------------------  HPI 62 y.o. female  presents for 3 month follow up on hypertension, cholesterol, diabetes, hypothyroid and vitamin D deficiency. She is following with endocrinology. She reports she has been doing well- she has received FreeStyle Libre continuous glucose monitoring device and is doing very well with this.   BMI is Body mass index is 24.5 kg/m., she has been working on diet and exercise, has been working on keto diet to try to lose some weight.  Wt Readings from Last 3 Encounters:   09/09/17 145 lb (65.8 kg)  05/09/17 144 lb (65.3 kg)  01/17/17 147 lb 3.2 oz (66.8 kg)   Her blood pressure has been controlled at home, today their BP is BP: 110/66  She does workout. She denies chest pain, shortness of breath, dizziness.   She is on cholesterol medication and denies myalgias. Her cholesterol is at goal. The cholesterol last visit was:   Lab Results  Component Value Date   CHOL 149 05/09/2017   HDL 70 05/09/2017   LDLCALC 64 05/09/2017   TRIG 74 05/09/2017   CHOLHDL 2.1 05/09/2017    She has been working on diet and exercise for diabetes on insulin, and denies hyperglycemia, hypoglycemia , increased appetite, nausea, paresthesia of the feet, visual disturbances and vomiting. Last A1C in the office was:  Lab Results  Component Value Date   HGBA1C 5.9 (H) 05/09/2017  She is followed by endocrinology every 3 months, just had labs drawn. Last A1C by endocrinology was reportedly 5.6.   Patient is on Vitamin D supplement and at goal:    Lab Results  Component Value Date   VD25OH 79 01/17/2017     She is on thyroid medication. Her medication was not changed last visit. Lab Results  Component Value Date   TSH 0.96 05/09/2017  .   Current Medications:  Current Outpatient Medications on File Prior to Visit  Medication Sig  . aspirin EC 81 MG tablet Take 1 tablet (81 mg total) by mouth daily.  Marland Kitchen atorvastatin (LIPITOR) 80 MG tablet TAKE 1 TABLET BY MOUTH DAILY  . cholecalciferol (VITAMIN D) 1000 UNITS tablet Take 6,000 Units by mouth daily.   Marland Kitchen estradiol (ESTRACE) 2 MG tablet Take 2 mg by mouth daily.   . ferrous  fumarate (HEMOCYTE - 106 MG FE) 325 (106 FE) MG TABS Take 1 tablet by mouth daily.  . Insulin Aspart (NOVOLOG FLEXPEN [place]) Inject into the skin. Sliding scale  . Insulin Glargine (TOUJEO SOLOSTAR [place]) Inject into the skin. Patient currently injects 18 units in the AM.  . levothyroxine (SYNTHROID, LEVOTHROID) 137 MCG tablet TAKE 1 TABLET BY MOUTH DAILY BEFORE  BREAKFAST  . lisinopril (PRINIVIL,ZESTRIL) 10 MG tablet TAKE 1/2 TO 1 TABLET BY MOUTH EVERY DAY  . Magnesium 500 MG CAPS Take 500 mg by mouth daily.  . medroxyPROGESTERone (PROVERA) 5 MG tablet Take 1 tablet 10 days every 3 months  . Omega-3 Fatty Acids (FISH OIL) 1200 MG CAPS Take 1 capsule (1,200 mg total) by mouth 2 (two) times daily.   No current facility-administered medications on file prior to visit.      Allergies: No Known Allergies   Medical History:  Past Medical History:  Diagnosis Date  . Diabetes mellitus without complication ([place])   . Hyperlipidemia   . Hypertension   . Iron deficiency anemia 01/17/2011  . Thyroid disease   . Type II or unspecified type diabetes mellitus without mention of complication, not stated as uncontrolled 02/04/2013  . Varicose veins   . Vitamin D deficiency    Family history- Reviewed and unchanged Social history- Reviewed and unchanged   Review of Systems:  Review of Systems  Constitutional: Negative for malaise/fatigue and weight loss.  HENT: Negative for hearing loss and tinnitus.   Eyes: Negative for blurred vision and double vision.  Respiratory: Negative for cough, shortness of breath and wheezing.   Cardiovascular: Negative for chest pain, palpitations, orthopnea, claudication and leg swelling.  Gastrointestinal: Negative for abdominal pain, blood in stool, constipation, diarrhea, heartburn, melena, nausea and vomiting.  Genitourinary: Negative.   Musculoskeletal: Negative for joint pain and myalgias.  Skin: Negative for rash.  Neurological: Negative for dizziness, tingling, sensory change, weakness and headaches.  Endo/Heme/Allergies: Negative for polydipsia.  Psychiatric/Behavioral: Negative.   All other systems reviewed and are negative.   Physical Exam: BP 110/66   Pulse 62   Temp 97.7 F (36.5 C)   Ht 5' 4.5" (1.638 m)   Wt 145 lb (65.8 kg)   LMP 08/11/2013 Comment: spotting  SpO2 99%   BMI 24.50 kg/m  Wt  Readings from Last 3 Encounters:  09/09/17 145 lb (65.8 kg)  05/09/17 144 lb (65.3 kg)  01/17/17 147 lb 3.2 oz (66.8 kg)   General Appearance: Well nourished, in no apparent distress. Eyes: PERRLA, EOMs, conjunctiva no swelling or erythema Sinuses: No Frontal/maxillary tenderness ENT/Mouth: Ext aud canals clear, TMs without erythema, bulging. No erythema, swelling, or exudate on post pharynx.  Tonsils not swollen or erythematous. Hearing normal.  Neck: Supple, thyroid normal.  Respiratory: Respiratory effort normal, BS equal bilaterally without rales, rhonchi, wheezing or stridor.  Cardio: RRR with no RGs; mild 2/6 decreshendo early systolic murmur noted. Brisk peripheral pulses without edema.  Abdomen: Soft, + BS.  Non tender, no guarding, rebound, hernias, masses. Lymphatics: Non tender without lymphadenopathy.  Musculoskeletal: Full ROM, 5/5 strength, Normal gait Skin: Warm, dry without rashes, lesions, ecchymosis.  Neuro: Cranial nerves intact. No cerebellar symptoms.  Psych: Awake and oriented X 3, normal affect, Insight and Judgment appropriate.    Izora Ribas, NP 8:42 AM [facility]

## 2017-09-09 ENCOUNTER — Ambulatory Visit: Payer: 59 | Admitting: Adult Health

## 2017-09-09 ENCOUNTER — Encounter: Payer: Self-pay | Admitting: Adult Health

## 2017-09-09 VITALS — BP 110/66 | HR 62 | Temp 97.7°F | Ht 64.5 in | Wt 145.0 lb

## 2017-09-09 DIAGNOSIS — E890 Postprocedural hypothyroidism: Secondary | ICD-10-CM

## 2017-09-09 DIAGNOSIS — Z79899 Other long term (current) drug therapy: Secondary | ICD-10-CM | POA: Diagnosis not present

## 2017-09-09 DIAGNOSIS — E139 Other specified diabetes mellitus without complications: Secondary | ICD-10-CM | POA: Diagnosis not present

## 2017-09-09 DIAGNOSIS — I10 Essential (primary) hypertension: Secondary | ICD-10-CM | POA: Diagnosis not present

## 2017-09-09 DIAGNOSIS — Z6824 Body mass index (BMI) 24.0-24.9, adult: Secondary | ICD-10-CM

## 2017-09-09 DIAGNOSIS — E559 Vitamin D deficiency, unspecified: Secondary | ICD-10-CM

## 2017-09-09 DIAGNOSIS — E782 Mixed hyperlipidemia: Secondary | ICD-10-CM | POA: Diagnosis not present

## 2017-09-09 NOTE — Patient Instructions (Signed)
Aim for 7+ servings of fruits and vegetables daily  80+ fluid ounces of water or unsweet tea for healthy kidneys  Limit animal fats in diet for cholesterol and heart health - choose grass fed whenever available  Aim for low stress - take time to unwind and care for your mental health  Aim for 150 min of moderate intensity exercise weekly for heart health, and weights twice weekly for bone health  Aim for 7-9 hours of sleep daily

## 2017-09-10 LAB — LIPID PANEL
CHOL/HDL RATIO: 2.3 (calc) (ref <5.0)
CHOLESTEROL: 159 mg/dL (ref <200)
HDL: 70 mg/dL (ref >50)
LDL CHOLESTEROL (CALC): 72 mg/dL
Non-HDL Cholesterol (Calc): 89 mg/dL (calc) (ref <130)
TRIGLYCERIDES: 86 mg/dL (ref <150)

## 2017-09-10 LAB — COMPLETE METABOLIC PANEL WITH GFR
AG RATIO: 1.9 (calc) (ref 1.0–2.5)
ALT: 22 U/L (ref 6–29)
AST: 18 U/L (ref 10–35)
Albumin: 4.1 g/dL (ref 3.6–5.1)
Alkaline phosphatase (APISO): 70 U/L (ref 33–130)
BUN: 17 mg/dL (ref 7–25)
CALCIUM: 9.3 mg/dL (ref 8.6–10.4)
CHLORIDE: 105 mmol/L (ref 98–110)
CO2: 26 mmol/L (ref 20–32)
Creat: 0.76 mg/dL (ref 0.50–1.05)
GFR, EST AFRICAN AMERICAN: 100 mL/min/{1.73_m2} (ref >=60)
GFR, EST NON AFRICAN AMERICAN: 86 mL/min/{1.73_m2} (ref >=60)
GLOBULIN: 2.2 g/dL (ref 1.9–3.7)
Glucose, Bld: 85 mg/dL (ref 65–99)
Potassium: 4.7 mmol/L (ref 3.5–5.3)
Sodium: 139 mmol/L (ref 135–146)
TOTAL PROTEIN: 6.3 g/dL (ref 6.1–8.1)
Total Bilirubin: 0.4 mg/dL (ref 0.2–1.2)

## 2017-09-10 LAB — CBC WITH DIFFERENTIAL/PLATELET
BASOS PCT: 1 %
Basophils Absolute: 50 cells/uL (ref 0–200)
Eosinophils Absolute: 90 cells/uL (ref 15–500)
Eosinophils Relative: 1.8 %
HEMATOCRIT: 43.9 % (ref 35.0–45.0)
HEMOGLOBIN: 14.5 g/dL (ref 11.7–15.5)
LYMPHS ABS: 1655 {cells}/uL (ref 850–3900)
MCH: 28.4 pg (ref 27.0–33.0)
MCHC: 33 g/dL (ref 32.0–36.0)
MCV: 86.1 fL (ref 80.0–100.0)
MPV: 11.1 fL (ref 7.5–12.5)
Monocytes Relative: 8.7 %
NEUTROS ABS: 2770 {cells}/uL (ref 1500–7800)
Neutrophils Relative %: 55.4 %
PLATELETS: 251 10*3/uL (ref 140–400)
RBC: 5.1 10*6/uL (ref 3.80–5.10)
RDW: 11.6 % (ref 11.0–15.0)
Total Lymphocyte: 33.1 %
WBC: 5 10*3/uL (ref 3.8–10.8)
WBCMIX: 435 {cells}/uL (ref 200–950)

## 2017-09-10 LAB — HEMOGLOBIN A1C
EAG (MMOL/L): 6.5 (calc)
Hgb A1c MFr Bld: 5.7 % of total Hgb — ABNORMAL HIGH (ref <5.7)
MEAN PLASMA GLUCOSE: 117 (calc)

## 2017-09-10 LAB — TSH: TSH: 1.06 mIU/L (ref 0.40–4.50)

## 2017-09-12 DIAGNOSIS — I10 Essential (primary) hypertension: Secondary | ICD-10-CM | POA: Diagnosis not present

## 2017-09-12 DIAGNOSIS — E782 Mixed hyperlipidemia: Secondary | ICD-10-CM | POA: Diagnosis not present

## 2017-09-12 DIAGNOSIS — E109 Type 1 diabetes mellitus without complications: Secondary | ICD-10-CM | POA: Diagnosis not present

## 2017-10-13 ENCOUNTER — Other Ambulatory Visit: Payer: Self-pay | Admitting: Internal Medicine

## 2017-10-13 DIAGNOSIS — E890 Postprocedural hypothyroidism: Secondary | ICD-10-CM

## 2017-11-12 ENCOUNTER — Other Ambulatory Visit: Payer: Self-pay | Admitting: Internal Medicine

## 2017-12-08 DIAGNOSIS — E109 Type 1 diabetes mellitus without complications: Secondary | ICD-10-CM | POA: Diagnosis not present

## 2017-12-08 DIAGNOSIS — E782 Mixed hyperlipidemia: Secondary | ICD-10-CM | POA: Diagnosis not present

## 2017-12-10 NOTE — Progress Notes (Signed)
FOLLOW UP  Assessment and Plan:   Hypertension Well controlled without medications Monitor blood pressure at home; patient to call if consistently greater than 130/80 Continue DASH diet.   Reminder to go to the ER if any CP, SOB, nausea, dizziness, severe HA, changes vision/speech, left arm numbness and tingling and jaw pain.  Cholesterol Continue medication- at goal Continue low cholesterol diet and exercise.  Check lipid panel.   Diabetes without complications Continue medications Continue diet and exercise.  Perform daily foot/skin check, notify office of any concerning changes.  Monitored by endocrinology; having checked tomorrow - defer checking to CPE  Vitamin D Def/ osteoporosis prevention Recent check at goal; Continue supplementation Defer Vit D level to CPE  Hypothyroidism continue medications the same  pending lab results reminded to take on an empty stomach 30-47mins before food.  check TSH level  Continue diet and meds as discussed. Further disposition pending results of labs. Discussed med's effects and SE's.   Over 30 minutes of exam, counseling, chart review, and critical decision making was performed.   Future Appointments  Date Time Provider [place]  05/15/2018  9:30 AM [name], NP [facility] None    ----------------------------------------------------------------------------------------------------------------------  HPI 62 y.o. female  presents for 3 month follow up on hypertension, cholesterol, diabetes, hypothyroid and vitamin D deficiency. She is following with endocrinology. She reports she has been doing well- she has received FreeStyle Libre continuous glucose monitoring device and is doing very well with this.   BMI is Body mass index is 24.78 kg/m., she has been working on diet and exercise, has been working on keto diet to try to lose some weight.  Wt Readings from Last 3 Encounters:  12/11/17 146 lb 9.6 oz (66.5 kg)   09/09/17 145 lb (65.8 kg)  05/09/17 144 lb (65.3 kg)   Her blood pressure has been controlled at home, today their BP is BP: 112/76  She does workout. She denies chest pain, shortness of breath, dizziness.   She is on cholesterol medication and denies myalgias. Her cholesterol is at goal. The cholesterol last visit was:   Lab Results  Component Value Date   CHOL 159 09/09/2017   HDL 70 09/09/2017   LDLCALC 72 09/09/2017   TRIG 86 09/09/2017   CHOLHDL 2.3 09/09/2017    She has been working on diet and exercise for LADA diabetes on insulin, and denies hyperglycemia, hypoglycemia , increased appetite, nausea, paresthesia of the feet, visual disturbances and vomiting. She reports blood sugars much more stable with freetyle libre and keto diet, generally ranging between 70-150 unless ill. Last A1C in the office was:  Lab Results  Component Value Date   HGBA1C 5.7 (H) 09/09/2017   Patient is on Vitamin D supplement and at goal:    Lab Results  Component Value Date   VD25OH 79 01/17/2017     She is on thyroid medication. Her medication was not changed last visit. Lab Results  Component Value Date   TSH 1.06 09/09/2017    Current Medications:  Current Outpatient Medications on File Prior to Visit  Medication Sig  . aspirin EC 81 MG tablet Take 1 tablet (81 mg total) by mouth daily.  Marland Kitchen atorvastatin (LIPITOR) 80 MG tablet TAKE 1 TABLET BY MOUTH DAILY  . cholecalciferol (VITAMIN D) 1000 UNITS tablet Take 5,000 Units by mouth daily.   Marland Kitchen estradiol (ESTRACE) 2 MG tablet Take 2 mg by mouth daily.   . ferrous fumarate (HEMOCYTE - 106 MG FE) 325 (  106 FE) MG TABS Take 1 tablet by mouth daily.  . Insulin Aspart (NOVOLOG FLEXPEN [place]) Inject into the skin. Sliding scale  . Insulin Glargine (TOUJEO SOLOSTAR [place]) Inject into the skin. Patient currently injects 18 units in the AM.  . levothyroxine (SYNTHROID, LEVOTHROID) 137 MCG tablet TAKE 1 TABLET BY MOUTH DAILY BEFORE BREAKFAST  . lisinopril  (PRINIVIL,ZESTRIL) 10 MG tablet TAKE 1/2 TO 1 TABLET BY MOUTH EVERY DAY  . Magnesium 500 MG CAPS Take 500 mg by mouth daily.  . medroxyPROGESTERone (PROVERA) 5 MG tablet Take 1 tablet 10 days every 3 months  . Omega-3 Fatty Acids (FISH OIL) 1200 MG CAPS Take 1 capsule (1,200 mg total) by mouth 2 (two) times daily.   No current facility-administered medications on file prior to visit.      Allergies: No Known Allergies   Medical History:  Past Medical History:  Diagnosis Date  . Diabetes mellitus without complication ([place])   . Hyperlipidemia   . Hypertension   . Iron deficiency anemia 01/17/2011  . Thyroid disease   . Type II or unspecified type diabetes mellitus without mention of complication, not stated as uncontrolled 02/04/2013  . Varicose veins   . Vitamin D deficiency    Family history- Reviewed and unchanged Social history- Reviewed and unchanged   Review of Systems:  Review of Systems  Constitutional: Negative for malaise/fatigue and weight loss.  HENT: Negative for hearing loss and tinnitus.   Eyes: Negative for blurred vision and double vision.  Respiratory: Negative for cough, shortness of breath and wheezing.   Cardiovascular: Negative for chest pain, palpitations, orthopnea, claudication and leg swelling.  Gastrointestinal: Negative for abdominal pain, blood in stool, constipation, diarrhea, heartburn, melena, nausea and vomiting.  Genitourinary: Negative.   Musculoskeletal: Negative for joint pain and myalgias.  Skin: Negative for rash.  Neurological: Negative for dizziness, tingling, sensory change, weakness and headaches.  Endo/Heme/Allergies: Negative for polydipsia.  Psychiatric/Behavioral: Negative.   All other systems reviewed and are negative.   Physical Exam: BP 112/76   Pulse 72   Temp 97.9 F (36.6 C)   Ht 5' 4.5" (1.638 m)   Wt 146 lb 9.6 oz (66.5 kg)   LMP 08/11/2013   SpO2 97%   BMI 24.78 kg/m  Wt Readings from Last 3 Encounters:   12/11/17 146 lb 9.6 oz (66.5 kg)  09/09/17 145 lb (65.8 kg)  05/09/17 144 lb (65.3 kg)   General Appearance: Well nourished, in no apparent distress. Eyes: PERRLA, EOMs, conjunctiva no swelling or erythema Sinuses: No Frontal/maxillary tenderness ENT/Mouth: Ext aud canals clear, TMs without erythema, bulging. No erythema, swelling, or exudate on post pharynx.  Tonsils not swollen or erythematous. Hearing normal.  Neck: Supple, thyroid normal.  Respiratory: Respiratory effort normal, BS equal bilaterally without rales, rhonchi, wheezing or stridor.  Cardio: RRR with no RGs; mild 2/6 decreshendo early systolic murmur noted. Brisk peripheral pulses without edema.  Abdomen: Soft, + BS.  Non tender, no guarding, rebound, hernias, masses. Lymphatics: Non tender without lymphadenopathy.  Musculoskeletal: Full ROM, 5/5 strength, Normal gait Skin: Warm, dry without rashes, lesions, ecchymosis.  Neuro: Cranial nerves intact. No cerebellar symptoms.  Psych: Awake and oriented X 3, normal affect, Insight and Judgment appropriate.    Izora Ribas, NP 8:46 AM [facility]

## 2017-12-11 ENCOUNTER — Encounter: Payer: Self-pay | Admitting: Adult Health

## 2017-12-11 ENCOUNTER — Ambulatory Visit: Payer: 59 | Admitting: Adult Health

## 2017-12-11 VITALS — BP 112/76 | HR 72 | Temp 97.9°F | Ht 64.5 in | Wt 146.6 lb

## 2017-12-11 DIAGNOSIS — E559 Vitamin D deficiency, unspecified: Secondary | ICD-10-CM | POA: Diagnosis not present

## 2017-12-11 DIAGNOSIS — I10 Essential (primary) hypertension: Secondary | ICD-10-CM | POA: Diagnosis not present

## 2017-12-11 DIAGNOSIS — Z79899 Other long term (current) drug therapy: Secondary | ICD-10-CM

## 2017-12-11 DIAGNOSIS — E782 Mixed hyperlipidemia: Secondary | ICD-10-CM | POA: Diagnosis not present

## 2017-12-11 DIAGNOSIS — Z23 Encounter for immunization: Secondary | ICD-10-CM

## 2017-12-11 DIAGNOSIS — E139 Other specified diabetes mellitus without complications: Secondary | ICD-10-CM | POA: Diagnosis not present

## 2017-12-11 DIAGNOSIS — E890 Postprocedural hypothyroidism: Secondary | ICD-10-CM | POA: Diagnosis not present

## 2017-12-11 LAB — LIPID PANEL
CHOLESTEROL: 166 mg/dL (ref <200)
HDL: 68 mg/dL (ref >50)
LDL Cholesterol (Calc): 77 mg/dL (calc)
NON-HDL CHOLESTEROL (CALC): 98 mg/dL (ref <130)
TRIGLYCERIDES: 120 mg/dL (ref <150)
Total CHOL/HDL Ratio: 2.4 (calc) (ref <5.0)

## 2017-12-11 LAB — CBC WITH DIFFERENTIAL/PLATELET
BASOS ABS: 73 {cells}/uL (ref 0–200)
Basophils Relative: 1.2 %
EOS PCT: 1.2 %
Eosinophils Absolute: 73 cells/uL (ref 15–500)
HCT: 43.9 % (ref 35.0–45.0)
Hemoglobin: 14.4 g/dL (ref 11.7–15.5)
Lymphs Abs: 1610 cells/uL (ref 850–3900)
MCH: 28.1 pg (ref 27.0–33.0)
MCHC: 32.8 g/dL (ref 32.0–36.0)
MCV: 85.6 fL (ref 80.0–100.0)
MPV: 11.1 fL (ref 7.5–12.5)
Monocytes Relative: 7.6 %
NEUTROS PCT: 63.6 %
Neutro Abs: 3880 cells/uL (ref 1500–7800)
PLATELETS: 303 10*3/uL (ref 140–400)
RBC: 5.13 10*6/uL — ABNORMAL HIGH (ref 3.80–5.10)
RDW: 12 % (ref 11.0–15.0)
TOTAL LYMPHOCYTE: 26.4 %
WBC: 6.1 10*3/uL (ref 3.8–10.8)
WBCMIX: 464 {cells}/uL (ref 200–950)

## 2017-12-11 LAB — COMPLETE METABOLIC PANEL WITH GFR
AG RATIO: 1.7 (calc) (ref 1.0–2.5)
ALKALINE PHOSPHATASE (APISO): 71 U/L (ref 33–130)
ALT: 20 U/L (ref 6–29)
AST: 16 U/L (ref 10–35)
Albumin: 4 g/dL (ref 3.6–5.1)
BUN/Creatinine Ratio: 33 (calc) — ABNORMAL HIGH (ref 6–22)
BUN: 26 mg/dL — ABNORMAL HIGH (ref 7–25)
CHLORIDE: 106 mmol/L (ref 98–110)
CO2: 30 mmol/L (ref 20–32)
Calcium: 9.6 mg/dL (ref 8.6–10.4)
Creat: 0.79 mg/dL (ref 0.50–0.99)
GFR, EST NON AFRICAN AMERICAN: 81 mL/min/{1.73_m2} (ref >=60)
GFR, Est African American: 94 mL/min/{1.73_m2} (ref >=60)
GLOBULIN: 2.3 g/dL (ref 1.9–3.7)
Glucose, Bld: 101 mg/dL — ABNORMAL HIGH (ref 65–99)
Potassium: 4.9 mmol/L (ref 3.5–5.3)
SODIUM: 141 mmol/L (ref 135–146)
Total Bilirubin: 0.4 mg/dL (ref 0.2–1.2)
Total Protein: 6.3 g/dL (ref 6.1–8.1)

## 2017-12-11 LAB — TSH: TSH: 1.12 mIU/L (ref 0.40–4.50)

## 2017-12-11 NOTE — Patient Instructions (Signed)
Know what a healthy weight is for you (roughly BMI <25) and aim to maintain this  Aim for 7+ servings of fruits and vegetables daily  65-80+ fluid ounces of water or unsweet tea for healthy kidneys  Limit to max 1 drink of alcohol per day; avoid smoking/tobacco  Limit animal fats in diet for cholesterol and heart health - choose grass fed whenever available  Avoid highly processed foods, and foods high in saturated/trans fats  Aim for low stress - take time to unwind and care for your mental health  Aim for 150 min of moderate intensity exercise weekly for heart health, and weights twice weekly for bone health  Aim for 7-9 hours of sleep daily    

## 2017-12-11 NOTE — Addendum Note (Signed)
Addended by: Chancy Hurter on: 12/11/2017 09:16 AM   Modules accepted: Orders

## 2017-12-12 DIAGNOSIS — E782 Mixed hyperlipidemia: Secondary | ICD-10-CM | POA: Diagnosis not present

## 2017-12-12 DIAGNOSIS — I10 Essential (primary) hypertension: Secondary | ICD-10-CM | POA: Diagnosis not present

## 2017-12-12 DIAGNOSIS — E109 Type 1 diabetes mellitus without complications: Secondary | ICD-10-CM | POA: Diagnosis not present

## 2018-01-21 ENCOUNTER — Other Ambulatory Visit: Payer: Self-pay | Admitting: Internal Medicine

## 2018-03-16 DIAGNOSIS — E782 Mixed hyperlipidemia: Secondary | ICD-10-CM | POA: Diagnosis not present

## 2018-03-16 DIAGNOSIS — E559 Vitamin D deficiency, unspecified: Secondary | ICD-10-CM | POA: Diagnosis not present

## 2018-03-16 DIAGNOSIS — E109 Type 1 diabetes mellitus without complications: Secondary | ICD-10-CM | POA: Diagnosis not present

## 2018-03-19 DIAGNOSIS — E782 Mixed hyperlipidemia: Secondary | ICD-10-CM | POA: Diagnosis not present

## 2018-03-19 DIAGNOSIS — I10 Essential (primary) hypertension: Secondary | ICD-10-CM | POA: Diagnosis not present

## 2018-03-19 DIAGNOSIS — E109 Type 1 diabetes mellitus without complications: Secondary | ICD-10-CM | POA: Diagnosis not present

## 2018-03-24 DIAGNOSIS — I788 Other diseases of capillaries: Secondary | ICD-10-CM | POA: Diagnosis not present

## 2018-03-24 DIAGNOSIS — I8312 Varicose veins of left lower extremity with inflammation: Secondary | ICD-10-CM | POA: Diagnosis not present

## 2018-04-02 DIAGNOSIS — I8312 Varicose veins of left lower extremity with inflammation: Secondary | ICD-10-CM | POA: Diagnosis not present

## 2018-04-13 ENCOUNTER — Other Ambulatory Visit: Payer: Self-pay | Admitting: Adult Health

## 2018-04-13 DIAGNOSIS — E890 Postprocedural hypothyroidism: Secondary | ICD-10-CM

## 2018-04-22 DIAGNOSIS — I8312 Varicose veins of left lower extremity with inflammation: Secondary | ICD-10-CM | POA: Diagnosis not present

## 2018-05-14 NOTE — Progress Notes (Addendum)
Complete Physical  Assessment and Plan:  Diagnoses and all orders for this visit:  Encounter for general adult medical examination with abnormal findings  Essential hypertension At goal; continue medication Monitor blood pressure at home; call if consistently over 130/80 Continue DASH diet.   Reminder to go to the ER if any CP, SOB, nausea, dizziness, severe HA, changes vision/speech, left arm numbness and tingling and jaw pain. -     EKG 12-Lead  Varicose veins of lower extremities with complications, unspecified laterality S/p vein ligation and stripping; recommended wearing support hose; follow up vascular as needed -  Symptoms stable and well managed  Postablative hypothyroidism continue medications the same pending lab results reminded to take on an empty stomach 30-24mins before food.  check TSH level -     TSH  LADA (latent autoimmune diabetes in adults), managed as type 1 ([place]) Followed by Dr. [name] Education: Reviewed 'ABCs' of diabetes management (respective goals in parentheses):  A1C (<7), blood pressure (<130/80), and cholesterol (LDL <70) Eye Exam yearly and Dental Exam every 6 months; reminded needs this year and to send report Dietary recommendations reviewed Physical Activity recommendations reviewed Foot exam performed -     Hemoglobin V6H  Systolic murmur Very mild; asymptomatic; monitor   Medication management -     CBC with Differential/Platelet -     CMP/GFR -     Magnesium   Mixed hyperlipidemia Continue medications: atorvastatin Continue low cholesterol diet and exercise.  Check lipid panel.  -     Lipid panel  Vitamin D deficiency At goal at recent check; continue to recommend supplementation for goal of 70-100 Check vitamin D level  BMI 24.0-24.9, adult  Screening for hematuria or proteinuria -     Microalbumin / creatinine urine ratio -     Urinalysis w microscopic + reflex cultur  Need for shingles vaccine - script for  shingles vaccine written to get at pharmacy   Orders Placed This Encounter  Procedures  . COMPLETE METABOLIC PANEL WITH GFR  . CBC with Differential/Platelet  . Magnesium  . Lipid panel  . TSH  . Hemoglobin A1c  . VITAMIN D 25 Hydroxy (Vit-D Deficiency, Fractures)  . Urinalysis, Routine w reflex microscopic  . Microalbumin / creatinine urine ratio  . Ambulatory referral to Gastroenterology  . EKG 12-Lead    Discussed med's effects and SE's. Screening labs and tests as requested with regular follow-up as recommended. Over 40 minutes of exam, counseling, chart review, and complex, high level critical decision making was performed this visit.   Future Appointments  Date Time Provider [place]  05/21/2019  9:30 AM [name], NP [facility] None     HPI  63 y.o. very pleasant married Caucasian female, presents for a complete physical and follow up for has Essential hypertension; Hyperlipidemia associated with type 2 diabetes mellitus ([place]); LADA (latent autoimmune diabetes in adults), managed as type 1 ([place]); Hypothyroidism; Vitamin D deficiency; Medication management; Varicose veins of lower extremities with complications; and Systolic murmur on their problem list.    She is married, works in [employer], mother of 2, 4 grand kids.   She is followed by Dr [name] for LADA as she was found to have elevated insulin antibodies.  Patient take a base of 12 units of glargine daily and covers bid with small doses (1 unit) of Novolog if glucose is >120 (which apparently is infrequent). 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  Followed by Dr. [name] for GYN and mammograms. She had novasure in 2015 and is on provera 5 mg x 10 days q51m. She reports with most recent course she had spotting which she has never had, will discuss with Dr. [name] at upcoming appointment.   BMI is Body mass index is 25.35 kg/m., she has been working on diet and exercise.  Works out for an hour 6 days a week and goes dancing 1 day a week. She drinks 2 glasses of wine weekly. She drinks 1/2+ gallon of water daily. She drinks 2 cups of coffee daily. Very rarely will drink soda.  Wt Readings from Last 3 Encounters:  05/15/18 150 lb (68 kg)  12/11/17 146 lb 9.6 oz (66.5 kg)  09/09/17 145 lb (65.8 kg)   Her blood pressure has been controlled at home, today their BP is BP: 104/64 She is on 5 mg of lisinopril.  She does workout. She denies chest pain, shortness of breath, dizziness.   She is on cholesterol medication (atorvastatin 40 mg daily) and denies myalgias. Her cholesterol is at goal. The cholesterol last visit was:   Lab Results  Component Value Date   CHOL 166 12/11/2017   HDL 68 12/11/2017   LDLCALC 77 12/11/2017   TRIG 120 12/11/2017   CHOLHDL 2.4 12/11/2017   She has been working on diet and exercise for LADA diabetes, she is on bASA, she is on ACE/ARB and denies foot ulcerations, hyperglycemia, increased appetite, nausea, paresthesia of the feet, polydipsia, polyuria, visual disturbances, vomiting and weight loss. She is monitored by freestyle libre. She does have hypoglycemia awareness with shakiness/weakness, - glucose was in the 50s - but reports this is rare and otherwise hadn't had in a long time. Last A1C in the office was:  Lab Results  Component Value Date   HGBA1C 5.7 (H) 09/09/2017   She is on thyroid medication. Her medication was not changed last visit.   Lab Results  Component Value Date   TSH 1.12 12/11/2017   Last GFR: Lab Results  Component Value Date   GFRNONAA 81 12/11/2017   Patient is on Vitamin D supplement and at goal at recent check:    Lab Results  Component Value Date   VD25OH 79 01/17/2017      Current Medications:  Current Outpatient Medications on File Prior to Visit  Medication Sig Dispense Refill  . aspirin EC 81 MG tablet Take 1 tablet (81 mg total) by mouth daily. 150 tablet 2  . atorvastatin (LIPITOR) 80  MG tablet TAKE 1 TABLET BY MOUTH DAILY (Patient taking differently: 40 mg. ) 30 tablet 1  . cholecalciferol (VITAMIN D) 1000 UNITS tablet Take 5,000 Units by mouth daily.     Marland Kitchen estradiol (ESTRACE) 2 MG tablet Take 2 mg by mouth daily.     . ferrous fumarate (HEMOCYTE - 106 MG FE) 325 (106 FE) MG TABS Take 1 tablet by mouth daily.    . Insulin Aspart (NOVOLOG FLEXPEN [place]) Inject into the skin. Sliding scale    . Insulin Glargine (TOUJEO SOLOSTAR [place]) Inject into the skin. Patient currently injects 18 units in the AM.    . levothyroxine (SYNTHROID, LEVOTHROID) 137 MCG tablet TAKE 1 TABLET BY MOUTH DAILY BEFORE BREAKFAST 90 tablet 1  . lisinopril (PRINIVIL,ZESTRIL) 10 MG tablet TAKE 1/2 TO 1 TABLET BY MOUTH EVERY DAY 90 tablet 1  . Magnesium 500 MG CAPS Take 500 mg by mouth daily.    . medroxyPROGESTERone (PROVERA) 5 MG  tablet Take 1 tablet 10 days every 3 months    . Omega-3 Fatty Acids (FISH OIL) 1200 MG CAPS Take 1 capsule (1,200 mg total) by mouth 2 (two) times daily.     No current facility-administered medications on file prior to visit.    Allergies:  No Known Allergies Medical History:  She has Essential hypertension; Hyperlipidemia associated with type 2 diabetes mellitus ([place]); LADA (latent autoimmune diabetes in adults), managed as type 1 ([place]); Hypothyroidism; Vitamin D deficiency; Medication management; Varicose veins of lower extremities with complications; and Systolic murmur on their problem list. Health Maintenance:   Immunization History  Administered Date(s) Administered  . Influenza Inj Mdck Quad With Preservative 12/11/2017  . Influenza Split 12/06/2013, 12/08/2014  . Influenza Whole 01/28/2012  . Influenza-Unspecified 12/04/2015, 12/17/2016  . PPD Test 02/08/2014, 03/15/2015, 04/05/2016  . Pneumococcal Polysaccharide-23 12/08/2014  . Td 04/05/2016  . Tdap 03/04/2006    Tetanus: 2018 Pneumovax: 2016 Prevnar 13: - Flu vaccine: 2019 Shingrix: n/a in office, will get  at pharmacy  PPD: 2018  LMP: Postmenopausal Pap: 05/21/2017 - with Dr. [name]  MGM: 05/21/3017  DEXA: 2006 normal Colonoscopy: 2010 - DUE - will refer back  EGD: -  Last Dental Exam: Dr. Radford Pax, 2019 - q 6 months Last Eye Exam: Dr. Allean Found, 2019, needs diabetic eye report - requested  Foot exam: Done today  Patient Care Team: Unk Pinto, MD as PCP - General  Surgical History:  She has a past surgical history that includes Vein ligation and stripping (Left, 2017); Vein ligation and stripping (Right, 2017); and Endometrial ablation w/ novasure (2015). Family History:  Herfamily history includes Anxiety disorder in her daughter; Arrhythmia in her mother; Breast cancer (age of onset: 57) in her maternal grandmother; Colitis in her daughter; Depression in her son; Diabetes in her mother; Fibromyalgia in her mother; Heart attack (age of onset: 60) in her father; Irritable bowel syndrome in her son; Osteoporosis in her mother and sister; Thyroid disease in her father and sister. Social History:  She reports that she quit smoking about 8 years ago. Her smoking use included cigarettes. She has a 20.00 pack-year smoking history. She has never used smokeless tobacco. She reports current alcohol use of about 2.0 standard drinks of alcohol per week. She reports that she does not use drugs.  Review of Systems: Review of Systems  Constitutional: Negative for malaise/fatigue and weight loss.  HENT: Negative for hearing loss and tinnitus.   Eyes: Negative for blurred vision and double vision.  Respiratory: Negative for cough, shortness of breath and wheezing.   Cardiovascular: Negative for chest pain, palpitations, orthopnea, claudication and leg swelling.  Gastrointestinal: Negative for abdominal pain, blood in stool, constipation, diarrhea, heartburn, melena, nausea and vomiting.  Genitourinary: Negative.   Musculoskeletal: Negative for joint pain and myalgias.  Skin: Negative for rash.   Neurological: Negative for dizziness, tingling, sensory change, weakness and headaches.  Endo/Heme/Allergies: Negative for polydipsia.  Psychiatric/Behavioral: Negative.   All other systems reviewed and are negative.   Physical Exam: Estimated body mass index is 25.35 kg/m as calculated from the following:   Height as of this encounter: 5' 4.5" (1.638 m).   Weight as of this encounter: 150 lb (68 kg). BP 104/64   Pulse 65   Temp 97.9 F (36.6 C)   Ht 5' 4.5" (1.638 m)   Wt 150 lb (68 kg)   LMP 08/11/2013   SpO2 99%   BMI 25.35 kg/m  General Appearance: Well nourished, in  no apparent distress.  Eyes: PERRLA, EOMs, conjunctiva no swelling or erythema, normal fundi and vessels.  Sinuses: No Frontal/maxillary tenderness  ENT/Mouth: Ext aud canals clear, normal light reflex with TMs without erythema, bulging. Good dentition. No erythema, swelling, or exudate on post pharynx. Tonsils not swollen or erythematous. Hearing normal.  Neck: Supple, thyroid normal. No bruits  Respiratory: Respiratory effort normal, BS equal bilaterally without rales, rhonchi, wheezing or stridor.  Cardio: RRR with 1/6 early systolic murmur without radiation, no rubs or gallops. Brisk peripheral pulses without edema.  Chest: symmetric, with normal excursions and percussion.  Breasts: Defer to GYN  Abdomen: Soft, nontender, no guarding, rebound, hernias, masses, or organomegaly.  Lymphatics: Non tender without lymphadenopathy.  Genitourinary: Defer to GYN Musculoskeletal: Full ROM all peripheral extremities,5/5 strength, and normal gait.  Skin: Warm, dry without rashes, lesions, ecchymosis. She has multiple very small 1-2 mm uniform non-raised nevi to back and upper extremities.  Neuro: Cranial nerves intact, reflexes equal bilaterally. Normal muscle tone, no cerebellar symptoms. Sensation intact.  Psych: Awake and oriented X 3, normal affect, Insight and Judgment appropriate.   EKG: WNL no ST  changes.  Izora Ribas 9:39 AM [facility]

## 2018-05-15 ENCOUNTER — Other Ambulatory Visit: Payer: Self-pay

## 2018-05-15 ENCOUNTER — Encounter: Payer: Self-pay | Admitting: Adult Health

## 2018-05-15 ENCOUNTER — Ambulatory Visit: Payer: 59 | Admitting: Adult Health

## 2018-05-15 VITALS — BP 104/64 | HR 65 | Temp 97.9°F | Ht 64.5 in | Wt 150.0 lb

## 2018-05-15 DIAGNOSIS — Z79899 Other long term (current) drug therapy: Secondary | ICD-10-CM

## 2018-05-15 DIAGNOSIS — E139 Other specified diabetes mellitus without complications: Secondary | ICD-10-CM | POA: Diagnosis not present

## 2018-05-15 DIAGNOSIS — Z0000 Encounter for general adult medical examination without abnormal findings: Secondary | ICD-10-CM

## 2018-05-15 DIAGNOSIS — E1169 Type 2 diabetes mellitus with other specified complication: Secondary | ICD-10-CM | POA: Diagnosis not present

## 2018-05-15 DIAGNOSIS — E890 Postprocedural hypothyroidism: Secondary | ICD-10-CM

## 2018-05-15 DIAGNOSIS — Z0001 Encounter for general adult medical examination with abnormal findings: Secondary | ICD-10-CM

## 2018-05-15 DIAGNOSIS — E1122 Type 2 diabetes mellitus with diabetic chronic kidney disease: Secondary | ICD-10-CM

## 2018-05-15 DIAGNOSIS — E785 Hyperlipidemia, unspecified: Secondary | ICD-10-CM

## 2018-05-15 DIAGNOSIS — I10 Essential (primary) hypertension: Secondary | ICD-10-CM | POA: Diagnosis not present

## 2018-05-15 DIAGNOSIS — Z136 Encounter for screening for cardiovascular disorders: Secondary | ICD-10-CM | POA: Diagnosis not present

## 2018-05-15 DIAGNOSIS — R011 Cardiac murmur, unspecified: Secondary | ICD-10-CM

## 2018-05-15 DIAGNOSIS — N182 Chronic kidney disease, stage 2 (mild): Secondary | ICD-10-CM

## 2018-05-15 DIAGNOSIS — E559 Vitamin D deficiency, unspecified: Secondary | ICD-10-CM

## 2018-05-15 DIAGNOSIS — Z1211 Encounter for screening for malignant neoplasm of colon: Secondary | ICD-10-CM

## 2018-05-15 DIAGNOSIS — I83899 Varicose veins of unspecified lower extremities with other complications: Secondary | ICD-10-CM

## 2018-05-15 NOTE — Patient Instructions (Addendum)
Mary Frank , Thank you for taking time to come for your Annual Wellness Visit. I appreciate your ongoing commitment to your health goals. Please review the following plan we discussed and let me know if I can assist you in the future.   These are the goals we discussed: Goals    . HEMOGLOBIN A1C < 7.0    . LDL CALC < 70       This is a list of the screening recommended for you and due dates:  Health Maintenance  Topic Date Due  . Eye exam for diabetics  07/23/2017  . Hemoglobin A1C  03/12/2018  . Mammogram  07/04/2018  . Colon Cancer Screening  12/14/2018  . Complete foot exam   05/15/2019  . Pap Smear  07/05/2019  . Tetanus Vaccine  04/05/2026  . Flu Shot  Completed  . Pneumococcal vaccine  Completed  .  Hepatitis C: One time screening is recommended by Center for Disease Control  (CDC) for  adults born from [birth year] through 1963.   Completed  . HIV Screening  Completed    Know what a healthy weight is for you (roughly BMI <25) and aim to maintain this  Aim for 7+ servings of fruits and vegetables daily  65-80+ fluid ounces of water or unsweet tea for healthy kidneys  Limit to max 1 drink of alcohol per day; avoid smoking/tobacco  Limit animal fats in diet for cholesterol and heart health - choose grass fed whenever available  Avoid highly processed foods, and foods high in saturated/trans fats  Aim for low stress - take time to unwind and care for your mental health  Aim for 150 min of moderate intensity exercise weekly for heart health, and weights twice weekly for bone health  Aim for 7-9 hours of sleep daily    We are certain at this time everyone has heard of the novel coronavirus, now called COVID 19. We want to give information to our patients about how to protect yourself and family, what to do if you feel you have symptoms, and how we are working to keep you informed.   At this time we are NOT accepting walk in appointments.  For your convenience and to prevent  transmission, we are now performing Evisits or telephone encounters for sick visits.  You can contact one of your providers through [facility] [place], your patient portal at any time or call the office during business hours.    PLEASE DO NOT CLICK THE EVISIT BUTTON ON MY CHART, INSTEAD CHOOSE TO MESSAGE YOUR PROVIDER AND SEND [place] A MESSAGE WITH YOUR ISSUES.   Keeping with community standards, when you seek medical attention through a telephone or E-visit encounter there may be a copay or office fee of 25 dollars for services rendered. However, in light of the coronavirus, the majority of insurances are covering telemedicine visits and waiving copays.   Please remember that this virus is happening at the same time as other respiratory viruses, such as influenza, common colds and now allergy season.   To reduce your risk of infection we recommend the same precautions as those used to avoid the common cold and flu virus  . Please wash your hands with soap and water for 20 seconds and frequently. Wendee Copp your hands before you eat or touch your face.  . Avoid touching your face, eyes, nose, and mouth as much as possible.  . if needing to cough or sneeze cover your mouth and nose by coughing or  sneezing into your elbow area, your sleeve or a tissue . Routinely clean frequently touched items such as doorknobs, keyboards, and phones.  . Avoid crowds of people.  Marland Kitchen avoid shaking hands with others; . We recommend anyone within the high-risk demographics such as older adults and those with heart/lung disease, auto-immune or immune-suppressing health conditions to consider reduced travel and public outings.  Please refer to the sources below for current updates, guidelines and recommendations.  You can call this hotline set up by [facility] 1 877 40COVID [phone])  You can visit these websites: CDC.gov WHO.int  Please visit the [place] webpage for the latest health notices and updates on  local and international travel.  What are the symptoms? There are many different symptoms reported HOWEVER the most common associated with the virus at this time is FEVER, DRY COUGH, and SHORTNESS OF BREATH.   What to do if you have symptoms or you feel that you have come in contact with someone that may be infected with coronavirus? Please stay at home.  Call our office or send a mychart message.  Please remember the majority of cases are self-limited and do not require in hospital intervention.  There is no specific treatment for a mild case of the virus other than supportive care.  Please only go to the ER if your symptoms escalate such as worsening shortness of breath, chest pain, and confusion.   Do you need a mask? You do not need a mask if you do not have symptoms. If you have a fever, cough, or shortness of breath please wear a mask to your appointment if you have one or we will provide one for you.   Testing for Coronavirus We will have testing available at the office, if after a mychart communication or telephone visit, we feel it is needed to get testing we will give you a time to come to the office for testing only. You will be asked to come in through a separate entrance to not contaminate the front office. We will test for the flu first since this virus is prevalent in the community and if this is negative will then test for the coronavirus. This lab is an outpatient lab, the turnaround time is 3-5 days, during which time you will be asked to self-quarantine and be given instructions.  We will not test you if you do not have symptoms or have not had potential contact at this time.  We will not test you if you just show up to the office, you need an appointment specifically for testing, please call or message first.

## 2018-05-16 LAB — URINALYSIS, ROUTINE W REFLEX MICROSCOPIC
Bilirubin Urine: NEGATIVE
Glucose, UA: NEGATIVE
Hgb urine dipstick: NEGATIVE
Ketones, ur: NEGATIVE
Leukocytes,Ua: NEGATIVE
Nitrite: NEGATIVE
Protein, ur: NEGATIVE
Specific Gravity, Urine: 1.019 (ref 1.001–1.03)
pH: 5.5 (ref 5.0–8.0)

## 2018-05-16 LAB — COMPLETE METABOLIC PANEL WITH GFR
AG Ratio: 1.9 (calc) (ref 1.0–2.5)
ALBUMIN MSPROF: 4.5 g/dL (ref 3.6–5.1)
ALT: 25 U/L (ref 6–29)
AST: 19 U/L (ref 10–35)
Alkaline phosphatase (APISO): 73 U/L (ref 37–153)
BILIRUBIN TOTAL: 0.4 mg/dL (ref 0.2–1.2)
BUN: 19 mg/dL (ref 7–25)
CALCIUM: 10 mg/dL (ref 8.6–10.4)
CHLORIDE: 104 mmol/L (ref 98–110)
CO2: 29 mmol/L (ref 20–32)
Creat: 0.77 mg/dL (ref 0.50–0.99)
GFR, EST AFRICAN AMERICAN: 97 mL/min/{1.73_m2} (ref >=60)
GFR, EST NON AFRICAN AMERICAN: 84 mL/min/{1.73_m2} (ref >=60)
GLUCOSE: 81 mg/dL (ref 65–99)
Globulin: 2.4 g/dL (calc) (ref 1.9–3.7)
Potassium: 4.1 mmol/L (ref 3.5–5.3)
Sodium: 140 mmol/L (ref 135–146)
TOTAL PROTEIN: 6.9 g/dL (ref 6.1–8.1)

## 2018-05-16 LAB — LIPID PANEL
Cholesterol: 184 mg/dL (ref <200)
HDL: 81 mg/dL (ref >=50)
LDL Cholesterol (Calc): 88 mg/dL (calc)
Non-HDL Cholesterol (Calc): 103 mg/dL (calc) (ref <130)
Total CHOL/HDL Ratio: 2.3 (calc) (ref <5.0)
Triglycerides: 67 mg/dL (ref <150)

## 2018-05-16 LAB — CBC WITH DIFFERENTIAL/PLATELET
Absolute Monocytes: 474 cells/uL (ref 200–950)
Basophils Absolute: 81 cells/uL (ref 0–200)
Basophils Relative: 1.1 %
EOS ABS: 141 {cells}/uL (ref 15–500)
Eosinophils Relative: 1.9 %
HCT: 45.1 % — ABNORMAL HIGH (ref 35.0–45.0)
Hemoglobin: 15.3 g/dL (ref 11.7–15.5)
Lymphs Abs: 2139 cells/uL (ref 850–3900)
MCH: 28.4 pg (ref 27.0–33.0)
MCHC: 33.9 g/dL (ref 32.0–36.0)
MCV: 83.8 fL (ref 80.0–100.0)
MPV: 10.9 fL (ref 7.5–12.5)
Monocytes Relative: 6.4 %
Neutro Abs: 4566 cells/uL (ref 1500–7800)
Neutrophils Relative %: 61.7 %
Platelets: 290 10*3/uL (ref 140–400)
RBC: 5.38 10*6/uL — ABNORMAL HIGH (ref 3.80–5.10)
RDW: 11.7 % (ref 11.0–15.0)
Total Lymphocyte: 28.9 %
WBC: 7.4 10*3/uL (ref 3.8–10.8)

## 2018-05-16 LAB — HEMOGLOBIN A1C
Hgb A1c MFr Bld: 6.6 % of total Hgb — ABNORMAL HIGH (ref <5.7)
Mean Plasma Glucose: 143 (calc)
eAG (mmol/L): 7.9 (calc)

## 2018-05-16 LAB — MICROALBUMIN / CREATININE URINE RATIO
Creatinine, Urine: 67 mg/dL (ref 20–275)
MICROALB/CREAT RATIO: 3 ug/mg{creat} (ref <30)
Microalb, Ur: 0.2 mg/dL

## 2018-05-16 LAB — VITAMIN D 25 HYDROXY (VIT D DEFICIENCY, FRACTURES): Vit D, 25-Hydroxy: 62 ng/mL (ref 30–100)

## 2018-05-16 LAB — MAGNESIUM: MAGNESIUM: 2.2 mg/dL (ref 1.5–2.5)

## 2018-05-16 LAB — TSH: TSH: 3.44 m[IU]/L (ref 0.40–4.50)

## 2018-06-09 ENCOUNTER — Other Ambulatory Visit: Payer: Self-pay | Admitting: Adult Health

## 2018-07-07 ENCOUNTER — Other Ambulatory Visit: Payer: Self-pay | Admitting: Internal Medicine

## 2018-07-15 DIAGNOSIS — I8312 Varicose veins of left lower extremity with inflammation: Secondary | ICD-10-CM | POA: Diagnosis not present

## 2018-09-11 LAB — HM DIABETES EYE EXAM

## 2018-09-15 ENCOUNTER — Encounter: Payer: Self-pay | Admitting: *Deleted

## 2018-10-06 ENCOUNTER — Other Ambulatory Visit: Payer: Self-pay | Admitting: Adult Health

## 2018-10-06 DIAGNOSIS — E890 Postprocedural hypothyroidism: Secondary | ICD-10-CM

## 2018-11-03 LAB — HM DEXA SCAN

## 2018-11-12 NOTE — Progress Notes (Signed)
FOLLOW UP  Assessment and Plan:   Hypertension Well controlled without medications Monitor blood pressure at home; patient to call if consistently greater than 130/80 Continue DASH diet.   Reminder to go to the ER if any CP, SOB, nausea, dizziness, severe HA, changes vision/speech, left arm numbness and tingling and jaw pain.  Cholesterol Currently mildly above LDL goal of <70 on atorvastatin 40 mg daily, switch to rosuvastatin if remains above goal  Continue low cholesterol diet and exercise.  Check lipid panel.   Diabetes without complications Continue medications Continue diet and exercise.  Perform daily foot/skin check, notify office of any concerning changes.  Defer A1C to endocrinology who is managing; check annually here at [facility]  Vitamin D Def/ osteoporosis prevention Recent check at goal; Continue supplementation Defer Vit D level to CPE  Hypothyroidism continue medications the same  pending lab results reminded to take on an empty stomach 30-30mins before food.  check TSH level  Continue diet and meds as discussed. Further disposition pending results of labs. Discussed med's effects and SE's.   Over 30 minutes of exam, counseling, chart review, and critical decision making was performed.   Future Appointments  Date Time Provider [place]  05/21/2019  9:30 AM [name], NP [facility] None    ----------------------------------------------------------------------------------------------------------------------  HPI 63 y.o. female  presents for 3 month follow up on hypertension, cholesterol, diabetes, hypothyroid and vitamin D deficiency.   She reports she was let go from her job, unrelated to covid, has opted for early retirement and enjoying this.   She is following with endocrinology. She reports she has been doing well- she has transitioned to [employer] continuous glucose monitoring device and is doing very well with this. Has upcoming  apointment with Dr. Wendie Simmer and will get A1C there.   BMI is Body mass index is 24.64 kg/m., she has been working on diet and exercise, has been working on keto diet to try to lose some weight.  Wt Readings from Last 3 Encounters:  11/16/18 145 lb 12.8 oz (66.1 kg)  05/15/18 150 lb (68 kg)  12/11/17 146 lb 9.6 oz (66.5 kg)   Her blood pressure has been controlled at home, today their BP is BP: 110/64  She does workout. She denies chest pain, shortness of breath, dizziness.   She is on cholesterol medication (atorvastatin 40 mg daily) and denies myalgias. Her cholesterol is at goal. The cholesterol last visit was:   Lab Results  Component Value Date   CHOL 184 05/15/2018   HDL 81 05/15/2018   LDLCALC 88 05/15/2018   TRIG 67 05/15/2018   CHOLHDL 2.3 05/15/2018    She has been working on diet and exercise for LADA diabetes on insulin (lantus 11 units and humalog sliding scale), and denies hyperglycemia, hypoglycemia , increased appetite, nausea, paresthesia of the feet, visual disturbances and vomiting. She reports blood sugars much more stable with freetyle libre and keto diet, generally ranging between 70-150 unless ill. Last A1C in the office was:  Lab Results  Component Value Date   HGBA1C 6.6 (H) 05/15/2018  she is following with endocrinology Dr. [name], last A1C in June was 6.2%, has upcoming appointment.   Patient is on Vitamin D supplement and at goal:    Lab Results  Component Value Date   VD25OH 46 05/15/2018     She is on thyroid medication. Her medication was not changed last visit. Lab Results  Component Value Date   TSH 3.44 05/15/2018  Current Medications:  Current Outpatient Medications on File Prior to Visit  Medication Sig  . aspirin EC 81 MG tablet Take 1 tablet (81 mg total) by mouth daily.  Marland Kitchen atorvastatin (LIPITOR) 80 MG tablet Take 1 tablet Daily for Cholesterol  . Calcium Citrate-Vitamin D (CALCIUM CITRATE + D3 PO) Take 600 mg by mouth.  .  cholecalciferol (VITAMIN D) 1000 UNITS tablet Take 5,000 Units by mouth daily.   Marland Kitchen estradiol (ESTRACE) 2 MG tablet Take 2 mg by mouth daily.   . ferrous fumarate (HEMOCYTE - 106 MG FE) 325 (106 FE) MG TABS Take 1 tablet by mouth daily.  . Insulin Aspart (NOVOLOG FLEXPEN [place]) Inject into the skin. Sliding scale  . insulin glargine (LANTUS) 100 UNIT/ML injection Inject into the skin daily. Injects 11 units daily  . Insulin Lispro (HUMALOG KWIKPEN [place]) Inject into the skin. Sliding scale  . levothyroxine (SYNTHROID) 137 MCG tablet TAKE 1 TABLET BY MOUTH DAILY BEFORE BREAKFAST  . lisinopril (ZESTRIL) 10 MG tablet Take 1 tablet Daily for BP & Kidney Protection  . Magnesium 500 MG CAPS Take 500 mg by mouth daily.  . medroxyPROGESTERone (PROVERA) 5 MG tablet Take 1 tablet 10 days every 3 months  . Omega-3 Fatty Acids (FISH OIL) 1200 MG CAPS Take 1 capsule (1,200 mg total) by mouth 2 (two) times daily.  . progesterone (PROMETRIUM) 100 MG capsule Take 100 mg by mouth daily.  . Insulin Glargine (TOUJEO SOLOSTAR [place]) Inject 12 Units into the skin daily. Patient currently injects 18 units in the AM.    No current facility-administered medications on file prior to visit.      Allergies: No Known Allergies   Medical History:  Past Medical History:  Diagnosis Date  . Diabetes mellitus without complication ([place])   . Hyperlipidemia   . Hypertension   . Iron deficiency anemia 01/17/2011  . Thyroid disease   . Type II or unspecified type diabetes mellitus without mention of complication, not stated as uncontrolled 02/04/2013  . Varicose veins   . Vitamin D deficiency    Family history- Reviewed and unchanged Social history- Reviewed and unchanged   Review of Systems:  Review of Systems  Constitutional: Negative for malaise/fatigue and weight loss.  HENT: Negative for hearing loss and tinnitus.   Eyes: Negative for blurred vision and double vision.  Respiratory: Negative for cough, shortness of  breath and wheezing.   Cardiovascular: Negative for chest pain, palpitations, orthopnea, claudication and leg swelling.  Gastrointestinal: Negative for abdominal pain, blood in stool, constipation, diarrhea, heartburn, melena, nausea and vomiting.  Genitourinary: Negative.   Musculoskeletal: Negative for joint pain and myalgias.  Skin: Negative for rash.  Neurological: Negative for dizziness, tingling, sensory change, weakness and headaches.  Endo/Heme/Allergies: Negative for polydipsia.  Psychiatric/Behavioral: Negative.   All other systems reviewed and are negative.   Physical Exam: BP 110/64   Pulse 67   Temp (!) 97.5 F (36.4 C)   Ht 5' 4.5" (1.638 m)   Wt 145 lb 12.8 oz (66.1 kg)   LMP 08/11/2013   SpO2 97%   BMI 24.64 kg/m  Wt Readings from Last 3 Encounters:  11/16/18 145 lb 12.8 oz (66.1 kg)  05/15/18 150 lb (68 kg)  12/11/17 146 lb 9.6 oz (66.5 kg)   General Appearance: Well nourished, in no apparent distress. Eyes: PERRLA, EOMs, conjunctiva no swelling or erythema Sinuses: No Frontal/maxillary tenderness ENT/Mouth: Ext aud canals clear, TMs without erythema, bulging. No erythema, swelling, or exudate on post  pharynx.  Tonsils not swollen or erythematous. Hearing normal.  Neck: Supple, thyroid normal.  Respiratory: Respiratory effort normal, BS equal bilaterally without rales, rhonchi, wheezing or stridor.  Cardio: RRR with no RGs; no murmer noted today.. Brisk peripheral pulses without edema.  Abdomen: Soft, + BS.  Non tender, no guarding, rebound, hernias, masses. Lymphatics: Non tender without lymphadenopathy.  Musculoskeletal: Full ROM, 5/5 strength, Normal gait Skin: Warm, dry without rashes, lesions, ecchymosis.  Neuro: Cranial nerves intact. No cerebellar symptoms.  Psych: Awake and oriented X 3, normal affect, Insight and Judgment appropriate.    Izora Ribas, NP 8:52 AM [facility]

## 2018-11-16 ENCOUNTER — Other Ambulatory Visit: Payer: Self-pay

## 2018-11-16 ENCOUNTER — Encounter: Payer: Self-pay | Admitting: Adult Health

## 2018-11-16 ENCOUNTER — Ambulatory Visit: Payer: 59 | Admitting: Adult Health

## 2018-11-16 VITALS — BP 110/64 | HR 67 | Temp 97.5°F | Ht 64.5 in | Wt 145.8 lb

## 2018-11-16 DIAGNOSIS — E559 Vitamin D deficiency, unspecified: Secondary | ICD-10-CM

## 2018-11-16 DIAGNOSIS — E1169 Type 2 diabetes mellitus with other specified complication: Secondary | ICD-10-CM

## 2018-11-16 DIAGNOSIS — Z6825 Body mass index (BMI) 25.0-25.9, adult: Secondary | ICD-10-CM

## 2018-11-16 DIAGNOSIS — M8580 Other specified disorders of bone density and structure, unspecified site: Secondary | ICD-10-CM | POA: Insufficient documentation

## 2018-11-16 DIAGNOSIS — E139 Other specified diabetes mellitus without complications: Secondary | ICD-10-CM

## 2018-11-16 DIAGNOSIS — Z79899 Other long term (current) drug therapy: Secondary | ICD-10-CM

## 2018-11-16 DIAGNOSIS — E890 Postprocedural hypothyroidism: Secondary | ICD-10-CM

## 2018-11-16 DIAGNOSIS — E1122 Type 2 diabetes mellitus with diabetic chronic kidney disease: Secondary | ICD-10-CM

## 2018-11-16 DIAGNOSIS — M8588 Other specified disorders of bone density and structure, other site: Secondary | ICD-10-CM

## 2018-11-16 DIAGNOSIS — I10 Essential (primary) hypertension: Secondary | ICD-10-CM

## 2018-11-16 DIAGNOSIS — N182 Chronic kidney disease, stage 2 (mild): Secondary | ICD-10-CM

## 2018-11-16 DIAGNOSIS — E785 Hyperlipidemia, unspecified: Secondary | ICD-10-CM

## 2018-11-16 NOTE — Patient Instructions (Signed)
Goals    . HEMOGLOBIN A1C < 7.0    . LDL CALC < 70      May switch from atorvastatin to rosuvastatin    Rosuvastatin Tablets What is this medicine? ROSUVASTATIN (roe SOO va sta tin) is known as a HMG-CoA reductase inhibitor or 'statin'. It lowers cholesterol and triglycerides in the blood. This drug may also reduce the risk of heart attack, stroke, or other health problems in patients with risk factors for heart disease. Diet and lifestyle changes are often used with this drug. This medicine may be used for other purposes; ask your health care provider or pharmacist if you have questions. COMMON BRAND NAME(S): Crestor What should I tell my health care provider before I take this medicine? They need to know if you have any of these conditions:  diabetes  if you often drink alcohol  history of stroke  kidney disease  liver disease  muscle aches or weakness  thyroid disease  an unusual or allergic reaction to rosuvastatin, other medicines, foods, dyes, or preservatives  pregnant or trying to get pregnant  breast-feeding How should I use this medicine? Take this medicine by mouth with a glass of water. Follow the directions on the prescription label. Do not cut, crush or chew this medicine. You can take this medicine with or without food. Take your doses at regular intervals. Do not take your medicine more often than directed. Talk to your pediatrician regarding the use of this medicine in children. While this drug may be prescribed for children as young as 46 years old for selected conditions, precautions do apply. Overdosage: If you think you have taken too much of this medicine contact a poison control center or emergency room at once. NOTE: This medicine is only for you. Do not share this medicine with others. What if I miss a dose? If you miss a dose, take it as soon as you can. If your next dose is to be taken in less than 12 hours, then do not take the missed dose. Take  the next dose at your regular time. Do not take double or extra doses. What may interact with this medicine? Do not take this medicine with any of the following medications:  herbal medicines like red yeast rice This medicine may also interact with the following medications:  alcohol  antacids containing aluminum hydroxide or magnesium hydroxide  cyclosporine  other medicines for high cholesterol  some medicines for HIV infection  warfarin This list may not describe all possible interactions. Give your health care provider a list of all the medicines, herbs, non-prescription drugs, or dietary supplements you use. Also tell them if you smoke, drink alcohol, or use illegal drugs. Some items may interact with your medicine. What should I watch for while using this medicine? Visit your doctor or health care professional for regular check-ups. You may need regular tests to make sure your liver is working properly. Your health care professional may tell you to stop taking this medicine if you develop muscle problems. If your muscle problems do not go away after stopping this medicine, contact your health care professional. Do not become pregnant while taking this medicine. Women should inform their health care professional if they wish to become pregnant or think they might be pregnant. There is a potential for serious side effects to an unborn child. Talk to your health care professional or pharmacist for more information. Do not breast-feed an infant while taking this medicine. This medicine may increase  blood sugar. Ask your healthcare provider if changes in diet or medicines are needed if you have diabetes. If you are going to need surgery or other procedure, tell your doctor that you are using this medicine. This drug is only part of a total heart-health program. Your doctor or a dietician can suggest a low-cholesterol and low-fat diet to help. Avoid alcohol and smoking, and keep a proper  exercise schedule. This medicine may cause a decrease in Co-Enzyme Q-10. You should make sure that you get enough Co-Enzyme Q-10 while you are taking this medicine. Discuss the foods you eat and the vitamins you take with your health care professional. What side effects may I notice from receiving this medicine? Side effects that you should report to your doctor or health care professional as soon as possible:  allergic reactions like skin rash, itching or hives, swelling of the face, lips, or tongue  confusion  joint pain  loss of memory  redness, blistering, peeling or loosening of the skin, including inside the mouth  signs and symptoms of high blood sugar such as being more thirsty or hungry or having to urinate more than normal. You may also feel very tired or have blurry vision.  signs and symptoms of muscle injury like dark urine; trouble passing urine or change in the amount of urine; unusually weak or tired; muscle pain or side or back pain  yellowing of the eyes or skin Side effects that usually do not require medical attention (report to your doctor or health care professional if they continue or are bothersome):  constipation  diarrhea  dizziness  gas  headache  nausea  stomach pain  trouble sleeping  upset stomach This list may not describe all possible side effects. Call your doctor for medical advice about side effects. You may report side effects to FDA at 1-800-FDA-1088. Where should I keep my medicine? Keep out of the reach of children. Store at room temperature between 20 and 25 degrees C (68 and 77 degrees F). Keep container tightly closed (protect from moisture). Throw away any unused medicine after the expiration date. NOTE: This sheet is a summary. It may not cover all possible information. If you have questions about this medicine, talk to your doctor, pharmacist, or health care provider.  2020 [employer]/Gold Standard (2017-12-11 08:25:08)

## 2018-11-17 ENCOUNTER — Encounter: Payer: Self-pay | Admitting: Internal Medicine

## 2018-11-17 LAB — CBC WITH DIFFERENTIAL/PLATELET
Absolute Monocytes: 426 cells/uL (ref 200–950)
Basophils Absolute: 52 cells/uL (ref 0–200)
Basophils Relative: 1 %
Eosinophils Absolute: 57 cells/uL (ref 15–500)
Eosinophils Relative: 1.1 %
HCT: 46.6 % — ABNORMAL HIGH (ref 35.0–45.0)
Hemoglobin: 15.2 g/dL (ref 11.7–15.5)
Lymphs Abs: 1409 cells/uL (ref 850–3900)
MCH: 28.3 pg (ref 27.0–33.0)
MCHC: 32.6 g/dL (ref 32.0–36.0)
MCV: 86.8 fL (ref 80.0–100.0)
MPV: 12.1 fL (ref 7.5–12.5)
Monocytes Relative: 8.2 %
Neutro Abs: 3255 cells/uL (ref 1500–7800)
Neutrophils Relative %: 62.6 %
Platelets: 244 10*3/uL (ref 140–400)
RBC: 5.37 10*6/uL — ABNORMAL HIGH (ref 3.80–5.10)
RDW: 12.1 % (ref 11.0–15.0)
Total Lymphocyte: 27.1 %
WBC: 5.2 10*3/uL (ref 3.8–10.8)

## 2018-11-17 LAB — COMPLETE METABOLIC PANEL WITH GFR
AG Ratio: 1.9 (calc) (ref 1.0–2.5)
ALT: 22 U/L (ref 6–29)
AST: 19 U/L (ref 10–35)
Albumin: 4.4 g/dL (ref 3.6–5.1)
Alkaline phosphatase (APISO): 62 U/L (ref 37–153)
BUN: 22 mg/dL (ref 7–25)
CO2: 31 mmol/L (ref 20–32)
Calcium: 10 mg/dL (ref 8.6–10.4)
Chloride: 106 mmol/L (ref 98–110)
Creat: 0.79 mg/dL (ref 0.50–0.99)
GFR, Est African American: 94 mL/min/{1.73_m2} (ref >=60)
GFR, Est Non African American: 81 mL/min/{1.73_m2} (ref >=60)
Globulin: 2.3 g/dL (calc) (ref 1.9–3.7)
Glucose, Bld: 80 mg/dL (ref 65–99)
Potassium: 4.9 mmol/L (ref 3.5–5.3)
Sodium: 142 mmol/L (ref 135–146)
Total Bilirubin: 0.5 mg/dL (ref 0.2–1.2)
Total Protein: 6.7 g/dL (ref 6.1–8.1)

## 2018-11-17 LAB — LIPID PANEL
Cholesterol: 149 mg/dL (ref <200)
HDL: 69 mg/dL (ref >=50)
LDL Cholesterol (Calc): 62 mg/dL (calc)
Non-HDL Cholesterol (Calc): 80 mg/dL (calc) (ref <130)
Total CHOL/HDL Ratio: 2.2 (calc) (ref <5.0)
Triglycerides: 97 mg/dL (ref <150)

## 2018-11-17 LAB — TSH: TSH: 0.43 mIU/L (ref 0.40–4.50)

## 2018-11-17 LAB — MAGNESIUM: Magnesium: 2.1 mg/dL (ref 1.5–2.5)

## 2018-12-04 ENCOUNTER — Encounter: Payer: Self-pay | Admitting: Adult Health

## 2019-04-05 ENCOUNTER — Other Ambulatory Visit: Payer: Self-pay | Admitting: Adult Health

## 2019-04-05 DIAGNOSIS — E890 Postprocedural hypothyroidism: Secondary | ICD-10-CM

## 2019-05-20 NOTE — Progress Notes (Signed)
Complete Physical  Assessment and Plan:  Diagnoses and all orders for this visit:  Encounter for general adult medical examination with abnormal findings  Essential hypertension At goal; continue medication Monitor blood pressure at home; call if consistently over 130/80 Continue DASH diet.   Reminder to go to the ER if any CP, SOB, nausea, dizziness, severe HA, changes vision/speech, left arm numbness and tingling and jaw pain. -     EKG 12-Lead  Varicose veins of lower extremities with complications, unspecified laterality S/p vein ligation and stripping; recommended wearing support hose; follow up vascular as needed -  Symptoms stable and well managed  Postablative hypothyroidism continue medications the same pending lab results reminded to take on an empty stomach 30-33mins before food.  check TSH level -     TSH  LADA (latent autoimmune diabetes in adults), managed as type 1 ([place]) Followed by Dr. [name] Education: Reviewed 'ABCs' of diabetes management (respective goals in parentheses):  A1C (<7), blood pressure (<130/80), and cholesterol (LDL <70) Eye Exam yearly and Dental Exam every 6 months; reminded needs this year and to send report Dietary recommendations reviewed Physical Activity recommendations reviewed Foot exam performed -     Hemoglobin [id number]  Systolic murmur Mild, intermittent, benign characteristics; not heart today; monitor   Medication management -     CBC with Differential/Platelet -     CMP/GFR -     Magnesium   Mixed hyperlipidemia Continue medications: atorvastatin Continue low cholesterol diet and exercise.  Check lipid panel.  -     Lipid panel  Vitamin D deficiency At goal at recent check; continue to recommend supplementation for goal of 70-100 Check vitamin D level  BMI 25adult  Screening for hematuria or proteinuria -     Microalbumin / creatinine urine ratio -     Urinalysis w microscopic + reflex cultur  Need for shingles  vaccine - script for shingles vaccine written to get at pharmacy, advised to defer until after covid 19 vaccine  Orders Placed This Encounter  Procedures  . CBC with Differential/Platelet  . COMPLETE METABOLIC PANEL WITH GFR  . Magnesium  . Lipid panel  . TSH  . Hemoglobin A1c  . VITAMIN D 25 Hydroxy (Vit-D Deficiency, Fractures)  . Microalbumin / creatinine urine ratio  . Urinalysis, Routine w reflex microscopic  . Iron, TIBC and Ferritin Panel  . EKG 12-Lead  . HM DIABETES FOOT EXAM    Discussed med's effects and SE's. Screening labs and tests as requested with regular follow-up as recommended. Over 40 minutes of exam, counseling, chart review, and complex, high level critical decision making was performed this visit.   Future Appointments  Date Time Provider Experiment  11/24/2019  9:30 AM [name], NP [facility] None  05/22/2020  9:00 AM [name], NP [facility] None     HPI  64 y.o. very pleasant married Caucasian female, presents for a complete physical and follow up for has Essential hypertension; Hyperlipidemia associated with type 2 diabetes mellitus ([place]); LADA (latent autoimmune diabetes in adults), managed as type 1 ([place]); Hypothyroidism; Vitamin D deficiency; Medication management; Varicose veins of lower extremities with complications; Systolic murmur; CKD stage 2 due to type 2 diabetes mellitus ([place]); and Osteopenia on their problem list.   She is married, recently retired from [employer], mother of 2, 4 grand kids in [place] and [place].   No concerns today.  Followed by Dr. [name] for GYN and mammograms. She had novasure in 2015. She is on  HRT via GYN, planning to discuss coming off of this.   BMI is Body mass index is 25.35 kg/m., she has been working on diet and exercise. Works out for an hour most days. Hoping to restart weekly.  She drinks 2 glasses of wine weekly.  She drinks 1/2+ gallon of water daily.  She drinks 1 cups of coffee daily, switched  to decam Very rarely will drink soda.  Does some keto/intermittent fasting Wt Readings from Last 3 Encounters:  05/21/19 150 lb (68 kg)  11/16/18 145 lb 12.8 oz (66.1 kg)  05/15/18 150 lb (68 kg)   Her blood pressure has been controlled at home, today their BP is BP: 108/68  She is on 5 mg of lisinopril.  She does workout. She denies chest pain, shortness of breath, dizziness.   She is on cholesterol medication (atorvastatin 40 mg daily) and denies myalgias. Her cholesterol is at goal. The cholesterol last visit was:   Lab Results  Component Value Date   CHOL 149 11/16/2018   HDL 69 11/16/2018   LDLCALC 62 11/16/2018   TRIG 97 11/16/2018   CHOLHDL 2.2 11/16/2018   She is [name] for LADA as she was found to have elevated insulin antibodies.  Patient take a base of 15 units of glargine daily and covers bid with small doses (1 unit) of humalog if glucose is >120 (which apparently is infrequent, typically once a week or so). 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  She has been working on diet and exercise for LADA diabetes. she is on bASA, she is on ACE/ARB and denies foot ulcerations, hyperglycemia, increased appetite, nausea, paresthesia of the feet, polydipsia, polyuria, visual disturbances, vomiting and weight loss. Last A1C in the office was:  Lab Results  Component Value Date   HGBA1C 6.6 (H) 05/15/2018   She is on thyroid medication. Her medication was not changed last visit.   Lab Results  Component Value Date   TSH 0.43 11/16/2018   She has CKD 2 monitored at this office:  Lab Results  Component Value Date   GFRNONAA 81 11/16/2018   Patient is on Vitamin D supplement and at goal at recent check:    Lab Results  Component Value Date   VD25OH 62 05/15/2018      Lab Results  Component Value Date   WBC 5.2 11/16/2018   HGB 15.2 11/16/2018   HCT 46.6 (H) 11/16/2018   MCV 86.8 11/16/2018   PLT 244 11/16/2018    Hx of iron def anemia, is on a supplement  Lab Results  Component Value Date   IRON 61 05/09/2017   TIBC 273 05/09/2017   FERRITIN 35 07/19/2011     Current Medications:  Current Outpatient Medications on File Prior to Visit  Medication Sig Dispense Refill  . atorvastatin (LIPITOR) 80 MG tablet Take 1 tablet Daily for Cholesterol 90 tablet 1  . Calcium Citrate-Vitamin D (CALCIUM CITRATE + D3 PO) Take 600 mg by mouth.    . cholecalciferol (VITAMIN D) 1000 UNITS tablet Take 5,000 Units by mouth daily.     Marland Kitchen estradiol (ESTRACE) 2 MG tablet Take 2 mg by mouth daily.     . ferrous fumarate (HEMOCYTE - 106 MG FE) 325 (106 FE) MG TABS Take 1 tablet by mouth daily.    . Insulin Aspart (NOVOLOG FLEXPEN [place]) Inject into the skin. Sliding scale    . insulin glargine (LANTUS) 100  UNIT/ML injection Inject into the skin daily. Injects 11 units daily    . Insulin Lispro (HUMALOG KWIKPEN [place]) Inject into the skin. Sliding scale    . levothyroxine (SYNTHROID) 137 MCG tablet TAKE 1 TABLET BY MOUTH DAILY BEFORE BREAKFAST 90 tablet 1  . lisinopril (ZESTRIL) 10 MG tablet Take 1 tablet Daily for BP & Kidney Protection 90 tablet 1  . Magnesium 500 MG CAPS Take 500 mg by mouth daily.    . medroxyPROGESTERone (PROVERA) 5 MG tablet Take 1 tablet 10 days every 3 months    . Omega-3 Fatty Acids (FISH OIL) 1200 MG CAPS Take 1 capsule (1,200 mg total) by mouth 2 (two) times daily.    . progesterone (PROMETRIUM) 100 MG capsule Take 100 mg by mouth daily.    . Insulin Glargine (TOUJEO SOLOSTAR [place]) Inject 12 Units into the skin daily. Patient currently injects 18 units in the AM.      No current facility-administered medications on file prior to visit.   Allergies:  No Known Allergies Medical History:  She has Essential hypertension; Hyperlipidemia associated with type 2 diabetes mellitus ([place]); LADA (latent autoimmune diabetes in adults), managed as type 1 ([place]); Hypothyroidism; Vitamin D deficiency; Medication  management; Varicose veins of lower extremities with complications; Systolic murmur; CKD stage 2 due to type 2 diabetes mellitus ([place]); and Osteopenia on their problem list. Health Maintenance:   Immunization History  Administered Date(s) Administered  . Influenza Inj Mdck Quad With Preservative 12/11/2017  . Influenza Split 12/06/2013, 12/08/2014  . Influenza Whole 01/28/2012  . Influenza-Unspecified 12/04/2015, 12/17/2016, 12/03/2018  . PPD Test 02/08/2014, 03/15/2015, 04/05/2016  . Pneumococcal Polysaccharide-23 12/08/2014  . Td 04/05/2016  . Tdap 03/04/2006    Tetanus: 2018 Pneumovax: 2016 Prevnar 13: - Flu vaccine: 2020 Shingrix: n/a in office, will get at pharmacy  PPD: 2018  LMP: Postmenopausal Pap: 3//2020 - with Dr. [name], has follow scheduled  MGM: 05/21/3017  DEXA: 2020, osteopenia at GYN  Colonoscopy: 2010 - DUE - referred back last year, she does plan to call to schedule this soon EGD: -  Last Dental Exam: Dr. Radford Pax, 2020 - q 6 months Last Eye Exam: Dr. Allean Found, 09/11/2018 abstracted   Foot exam: Done today   Patient Care Team: Unk Pinto, MD as PCP - General  Surgical History:  She has a past surgical history that includes Vein ligation and stripping (Left, 2017); Vein ligation and stripping (Right, 2017); and Endometrial ablation w/ novasure (2015). Family History:  Herfamily history includes Anxiety disorder in her daughter; Arrhythmia in her mother; Breast cancer (age of onset: 45) in her maternal grandmother; Colitis in her daughter; Depression in her son; Diabetes in her mother; Fibromyalgia in her mother; Heart attack (age of onset: 86) in her father; Irritable bowel syndrome in her son; Osteoporosis in her mother and sister; Thyroid disease in her father and sister. Social History:  She reports that she quit smoking about 9 years ago. Her smoking use included cigarettes. She has a 20.00 pack-year smoking history. She has never used smokeless tobacco. She  reports current alcohol use of about 2.0 standard drinks of alcohol per week. She reports that she does not use drugs.  Review of Systems: Review of Systems  Constitutional: Negative for malaise/fatigue and weight loss.  HENT: Negative for hearing loss and tinnitus.   Eyes: Negative for blurred vision and double vision.  Respiratory: Negative for cough, shortness of breath and wheezing.   Cardiovascular: Negative for chest pain, palpitations, orthopnea, claudication  and leg swelling.  Gastrointestinal: Negative for abdominal pain, blood in stool, constipation, diarrhea, heartburn, melena, nausea and vomiting.  Genitourinary: Negative.   Musculoskeletal: Negative for joint pain and myalgias.  Skin: Negative for rash.  Neurological: Negative for dizziness, tingling, sensory change, weakness and headaches.  Endo/Heme/Allergies: Negative for polydipsia.  Psychiatric/Behavioral: Negative.   All other systems reviewed and are negative.   Physical Exam: Estimated body mass index is 25.35 kg/m as calculated from the following:   Height as of this encounter: 5' 4.5" (1.638 m).   Weight as of this encounter: 150 lb (68 kg). BP 108/68   Pulse 67   Temp (!) 97 F (36.1 C)   Ht 5' 4.5" (1.638 m)   Wt 150 lb (68 kg)   LMP 08/11/2013   SpO2 97%   BMI 25.35 kg/m  General Appearance: Well nourished, in no apparent distress.  Eyes: PERRLA, EOMs, conjunctiva no swelling or erythema Sinuses: No Frontal/maxillary tenderness  ENT/Mouth: Ext aud canals clear, normal light reflex with TMs without erythema, bulging. Good dentition. No erythema, swelling, or exudate on post pharynx. Tonsils not swollen or erythematous. Hearing normal.  Neck: Supple, thyroid normal. No bruits  Respiratory: Respiratory effort normal, BS equal bilaterally without rales, rhonchi, wheezing or stridor.  Cardio: RRR without murmur, no rubs or gallops. Brisk peripheral pulses without edema.  Chest: symmetric, with normal  excursions and percussion.  Breasts: Defer to GYN  Abdomen: Soft, nontender, no guarding, rebound, hernias, masses, or organomegaly.  Lymphatics: Non tender without lymphadenopathy.  Genitourinary: Defer to GYN Musculoskeletal: Full ROM all peripheral extremities,5/5 strength, and normal gait.  Skin: Warm, dry without rashes, lesions, ecchymosis. She has multiple very small 1-2 mm uniform non-raised nevi to back and upper extremities.  Neuro: Cranial nerves intact, reflexes equal bilaterally. Normal muscle tone, no cerebellar symptoms. Sensation intact bil to monofilament 10/10  Psych: Awake and oriented X 3, normal affect, Insight and Judgment appropriate.   EKG: Sinus bradycardia, NSCPT  Gorden Harms Sareena Odeh 12:14 PM [facility]

## 2019-05-21 ENCOUNTER — Encounter: Payer: Self-pay | Admitting: Adult Health

## 2019-05-21 ENCOUNTER — Ambulatory Visit: Payer: 59 | Admitting: Adult Health

## 2019-05-21 ENCOUNTER — Other Ambulatory Visit: Payer: Self-pay

## 2019-05-21 VITALS — BP 108/68 | HR 67 | Temp 97.0°F | Ht 64.5 in | Wt 150.0 lb

## 2019-05-21 DIAGNOSIS — Z6825 Body mass index (BMI) 25.0-25.9, adult: Secondary | ICD-10-CM

## 2019-05-21 DIAGNOSIS — I83899 Varicose veins of unspecified lower extremities with other complications: Secondary | ICD-10-CM

## 2019-05-21 DIAGNOSIS — Z136 Encounter for screening for cardiovascular disorders: Secondary | ICD-10-CM | POA: Diagnosis not present

## 2019-05-21 DIAGNOSIS — D649 Anemia, unspecified: Secondary | ICD-10-CM

## 2019-05-21 DIAGNOSIS — E1169 Type 2 diabetes mellitus with other specified complication: Secondary | ICD-10-CM

## 2019-05-21 DIAGNOSIS — Z0000 Encounter for general adult medical examination without abnormal findings: Secondary | ICD-10-CM

## 2019-05-21 DIAGNOSIS — E1122 Type 2 diabetes mellitus with diabetic chronic kidney disease: Secondary | ICD-10-CM

## 2019-05-21 DIAGNOSIS — I10 Essential (primary) hypertension: Secondary | ICD-10-CM

## 2019-05-21 DIAGNOSIS — E785 Hyperlipidemia, unspecified: Secondary | ICD-10-CM

## 2019-05-21 DIAGNOSIS — Z79899 Other long term (current) drug therapy: Secondary | ICD-10-CM

## 2019-05-21 DIAGNOSIS — N182 Chronic kidney disease, stage 2 (mild): Secondary | ICD-10-CM

## 2019-05-21 DIAGNOSIS — E139 Other specified diabetes mellitus without complications: Secondary | ICD-10-CM

## 2019-05-21 DIAGNOSIS — M8588 Other specified disorders of bone density and structure, other site: Secondary | ICD-10-CM

## 2019-05-21 DIAGNOSIS — R011 Cardiac murmur, unspecified: Secondary | ICD-10-CM

## 2019-05-21 DIAGNOSIS — Z0001 Encounter for general adult medical examination with abnormal findings: Secondary | ICD-10-CM

## 2019-05-21 DIAGNOSIS — E890 Postprocedural hypothyroidism: Secondary | ICD-10-CM

## 2019-05-21 DIAGNOSIS — E559 Vitamin D deficiency, unspecified: Secondary | ICD-10-CM

## 2019-05-21 NOTE — Patient Instructions (Addendum)
  Mary Frank , Thank you for taking time to come for your Annual Wellness Visit. I appreciate your ongoing commitment to your health goals. Please review the following plan we discussed and let me know if I can assist you in the future.   These are the goals we discussed: Goals    . HEMOGLOBIN A1C < 7.0    . LDL CALC < 70       This is a list of the screening recommended for you and due dates:  Health Maintenance  Topic Date Due  . Hemoglobin A1C  11/15/2018  . Colon Cancer Screening  12/14/2018  . Pap Smear  07/05/2019  . Eye exam for diabetics  09/11/2019  . Mammogram  05/02/2020  . Complete foot exam   05/20/2020  . Tetanus Vaccine  04/05/2026  . Flu Shot  Completed  . Pneumococcal vaccine  Completed  .  Hepatitis C: One time screening is recommended by Center for Disease Control  (CDC) for  adults born from [birth year] through 1963.   Completed  . HIV Screening  Completed     Wait until covid 19 vaccines completed for shingles vaccine  FYI - they recommend getting mammogram prior to covid 19 vaccines or at least 6 weeks after last shot  Please schedule colonoscopy ASAP    Know what a healthy weight is for you (roughly BMI <25) and aim to maintain this  Aim for 7+ servings of fruits and vegetables daily  65-80+ fluid ounces of water or unsweet tea for healthy kidneys  Limit to max 1 drink of alcohol per day; avoid smoking/tobacco  Limit animal fats in diet for cholesterol and heart health - choose grass fed whenever available  Avoid highly processed foods, and foods high in saturated/trans fats  Aim for low stress - take time to unwind and care for your mental health  Aim for 150 min of moderate intensity exercise weekly for heart health, and weights twice weekly for bone health  Aim for 7-9 hours of sleep daily

## 2019-05-22 LAB — COMPLETE METABOLIC PANEL WITH GFR
AG Ratio: 2.1 (calc) (ref 1.0–2.5)
ALT: 26 U/L (ref 6–29)
AST: 20 U/L (ref 10–35)
Albumin: 4.1 g/dL (ref 3.6–5.1)
Alkaline phosphatase (APISO): 64 U/L (ref 37–153)
BUN: 23 mg/dL (ref 7–25)
CO2: 29 mmol/L (ref 20–32)
Calcium: 9.2 mg/dL (ref 8.6–10.4)
Chloride: 106 mmol/L (ref 98–110)
Creat: 0.7 mg/dL (ref 0.50–0.99)
GFR, Est African American: 108 mL/min/{1.73_m2} (ref >=60)
GFR, Est Non African American: 94 mL/min/{1.73_m2} (ref >=60)
Globulin: 2 g/dL (calc) (ref 1.9–3.7)
Glucose, Bld: 82 mg/dL (ref 65–99)
Potassium: 4.1 mmol/L (ref 3.5–5.3)
Sodium: 142 mmol/L (ref 135–146)
Total Bilirubin: 0.4 mg/dL (ref 0.2–1.2)
Total Protein: 6.1 g/dL (ref 6.1–8.1)

## 2019-05-22 LAB — CBC WITH DIFFERENTIAL/PLATELET
Absolute Monocytes: 374 cells/uL (ref 200–950)
Basophils Absolute: 50 cells/uL (ref 0–200)
Basophils Relative: 0.9 %
Eosinophils Absolute: 88 cells/uL (ref 15–500)
Eosinophils Relative: 1.6 %
HCT: 43.2 % (ref 35.0–45.0)
Hemoglobin: 14 g/dL (ref 11.7–15.5)
Lymphs Abs: 1804 cells/uL (ref 850–3900)
MCH: 28.1 pg (ref 27.0–33.0)
MCHC: 32.4 g/dL (ref 32.0–36.0)
MCV: 86.7 fL (ref 80.0–100.0)
MPV: 11.6 fL (ref 7.5–12.5)
Monocytes Relative: 6.8 %
Neutro Abs: 3185 cells/uL (ref 1500–7800)
Neutrophils Relative %: 57.9 %
Platelets: 236 10*3/uL (ref 140–400)
RBC: 4.98 10*6/uL (ref 3.80–5.10)
RDW: 11.7 % (ref 11.0–15.0)
Total Lymphocyte: 32.8 %
WBC: 5.5 10*3/uL (ref 3.8–10.8)

## 2019-05-22 LAB — URINALYSIS, ROUTINE W REFLEX MICROSCOPIC
Bilirubin Urine: NEGATIVE
Glucose, UA: NEGATIVE
Hgb urine dipstick: NEGATIVE
Ketones, ur: NEGATIVE
Leukocytes,Ua: NEGATIVE
Nitrite: NEGATIVE
Protein, ur: NEGATIVE
Specific Gravity, Urine: 1.02 (ref 1.001–1.03)
pH: 6 (ref 5.0–8.0)

## 2019-05-22 LAB — VITAMIN D 25 HYDROXY (VIT D DEFICIENCY, FRACTURES): Vit D, 25-Hydroxy: 65 ng/mL (ref 30–100)

## 2019-05-22 LAB — LIPID PANEL
Cholesterol: 162 mg/dL (ref <200)
HDL: 77 mg/dL (ref >=50)
LDL Cholesterol (Calc): 71 mg/dL (calc)
Non-HDL Cholesterol (Calc): 85 mg/dL (calc) (ref <130)
Total CHOL/HDL Ratio: 2.1 (calc) (ref <5.0)
Triglycerides: 60 mg/dL (ref <150)

## 2019-05-22 LAB — IRON,TIBC AND FERRITIN PANEL
%SAT: 48 % (calc) — ABNORMAL HIGH (ref 16–45)
Ferritin: 51 ng/mL (ref 16–288)
Iron: 136 ug/dL (ref 45–160)
TIBC: 285 mcg/dL (calc) (ref 250–450)

## 2019-05-22 LAB — HEMOGLOBIN A1C
Hgb A1c MFr Bld: 6.4 % of total Hgb — ABNORMAL HIGH (ref <5.7)
Mean Plasma Glucose: 137 (calc)
eAG (mmol/L): 7.6 (calc)

## 2019-05-22 LAB — MICROALBUMIN / CREATININE URINE RATIO
Creatinine, Urine: 82 mg/dL (ref 20–275)
Microalb Creat Ratio: 4 mcg/mg creat (ref <30)
Microalb, Ur: 0.3 mg/dL

## 2019-05-22 LAB — TSH: TSH: 0.48 mIU/L (ref 0.40–4.50)

## 2019-05-22 LAB — MAGNESIUM: Magnesium: 1.9 mg/dL (ref 1.5–2.5)

## 2019-06-24 ENCOUNTER — Other Ambulatory Visit: Payer: Self-pay | Admitting: Internal Medicine

## 2019-07-29 ENCOUNTER — Other Ambulatory Visit: Payer: Self-pay | Admitting: Internal Medicine

## 2019-10-04 ENCOUNTER — Other Ambulatory Visit: Payer: Self-pay | Admitting: Adult Health

## 2019-10-04 DIAGNOSIS — E890 Postprocedural hypothyroidism: Secondary | ICD-10-CM

## 2019-10-06 LAB — HM PAP SMEAR: HM Pap smear: NEGATIVE

## 2019-10-07 LAB — HM MAMMOGRAPHY

## 2019-11-22 NOTE — Progress Notes (Signed)
FOLLOW UP  Assessment and Plan:   Hypertension Well controlled without medications Monitor blood pressure at home; patient to call if consistently greater than 130/80 Continue DASH diet.   Reminder to go to the ER if any CP, SOB, nausea, dizziness, severe HA, changes vision/speech, left arm numbness and tingling and jaw pain.  Cholesterol Currently mildly above LDL goal of <70 on atorvastatin 40 mg daily, increase to atorvastatin 80 mg Continue low cholesterol diet and exercise.  Check lipid panel.   Diabetes without complications Continue medications Continue diet and exercise.  Perform daily foot/skin check, notify office of any concerning changes.  Defer A1C to endocrinology who is managing; check annually here at [facility]  Vitamin D Def/ osteoporosis prevention Recent check at goal; Continue supplementation Defer Vit D level to [facility]  Hypothyroidism continue medications the same  pending lab results reminded to take on an empty stomach 30-1mins before food.  check TSH level  Endometrial adenocarcinoma [facility]) Dr. [name], pending hysterectomy   Just had labs checked in July by Dr. Orpah Greek; deferred today; clarify what he intends to follow   Continue diet and meds as discussed. Further disposition pending results of labs. Discussed med's effects and SE's.   Over 30 minutes of exam, counseling, chart review, and critical decision making was performed.   Future Appointments  Date Time Provider [place]  05/22/2020  9:00 AM Garnet Sierras, NP [facility] None    ----------------------------------------------------------------------------------------------------------------------  HPI 64 y.o. female  presents for 6 month follow up on hypertension, cholesterol, hypothyroid and vitamin D deficiency. Seeing endocrinology q61m for diabetes.  She reports she was let go from her job, unrelated to covid, has opted for early retirement and enjoying this.   She has had  vaginal bleeding, has hysteroscopy on 11/18/2019 by Dr. [name] which showed polyp, endometrial adenocarcinoma grade 1, recommended hysterectomy which she will be scheduling shortly.   BMI is Body mass index is 25.35 kg/m., she has been working on diet and exercise, has been working on keto diet to try to lose some weight.  Wt Readings from Last 3 Encounters:  11/24/19 150 lb (68 kg)  05/21/19 150 lb (68 kg)  11/16/18 145 lb 12.8 oz (66.1 kg)   Her blood pressure has been controlled at home, today their BP is BP: 110/76  She does workout. She denies chest pain, shortness of breath, dizziness.   She is on cholesterol medication (atorvastatin 40 mg daily) and denies myalgias. Her cholesterol is at goal. Had cholesterol checked July by endocrine, LDL was 78. The cholesterol last visit was:   Lab Results  Component Value Date   CHOL 162 05/21/2019   HDL 77 05/21/2019   LDLCALC 71 05/21/2019   TRIG 60 05/21/2019   CHOLHDL 2.1 05/21/2019    She has been working on diet and exercise for LADA diabetes managed by [name] [facility] on insulin (lantus 11 units and humalog sliding scale), and denies hyperglycemia, hypoglycemia , increased appetite, nausea, paresthesia of the feet, visual disturbances and vomiting. She reports blood sugars much more stable with freetyle libre and keto diet, average 107. Last A1C in the office was:  Lab Results  Component Value Date   HGBA1C 6.4 (H) 05/21/2019  last A1C in July was 6.2%, has upcoming appointment.   Patient is on Vitamin D supplement and at goal:    Lab Results  Component Value Date   VD25OH 62 05/21/2019     She is on thyroid medication. Her medication was  not changed last visit. Lab Results  Component Value Date   TSH 0.48 05/21/2019    Current Medications:  Current Outpatient Medications on File Prior to Visit  Medication Sig  . atorvastatin (LIPITOR) 80 MG tablet Take 1 tablet Daily for Cholesterol  . Calcium  Citrate-Vitamin D (CALCIUM CITRATE + D3 PO) Take 600 mg by mouth.  . cholecalciferol (VITAMIN D) 1000 UNITS tablet Take 5,000 Units by mouth daily.   Marland Kitchen estradiol (ESTRACE) 2 MG tablet Take 2 mg by mouth daily.   . ferrous fumarate (HEMOCYTE - 106 MG FE) 325 (106 FE) MG TABS Take 1 tablet by mouth daily.  . insulin glargine (LANTUS) 100 UNIT/ML injection Inject into the skin daily. Injects 11 units daily  . Insulin Lispro (HUMALOG KWIKPEN ) Inject into the skin. Sliding scale  . levothyroxine (SYNTHROID) 137 MCG tablet Take 1 tablet daily on an empty stomach with only water for 30 minutes & no Antacid meds, Calcium or Magnesium for 4 hours & avoid Biotin  . lisinopril (ZESTRIL) 10 MG tablet Take 1 tablet Daily for BP & Diabetic Kidney Protect[place]  . Magnesium 500 MG CAPS Take 500 mg by mouth daily.  . medroxyPROGESTERone (PROVERA) 5 MG tablet Take 1 tablet 10 days every 3 months  . Omega-3 Fatty Acids (FISH OIL) 1200 MG CAPS Take 1 capsule (1,200 mg total) by mouth 2 (two) times daily.  . progesterone (PROMETRIUM) 100 MG capsule Take 100 mg by mouth daily.   No current facility-administered medications on file prior to visit.     Allergies: No Known Allergies   Medical History:  Past Medical History:  Diagnosis Date  . Diabetes mellitus without complication ([place])   . Hyperlipidemia   . Hypertension   . Iron deficiency anemia 01/17/2011  . Thyroid disease   . Type II or unspecified type diabetes mellitus without mention of complication, not stated as uncontrolled 02/04/2013  . Varicose veins   . Vitamin D deficiency    Family history- Reviewed and unchanged Social history- Reviewed and unchanged   Review of Systems:  Review of Systems  Constitutional: Negative for malaise/fatigue and weight loss.  HENT: Negative for hearing loss and tinnitus.   Eyes: Negative for blurred vision and double vision.  Respiratory: Negative for cough, shortness of breath and wheezing.    Cardiovascular: Negative for chest pain, palpitations, orthopnea, claudication and leg swelling.  Gastrointestinal: Negative for abdominal pain, blood in stool, constipation, diarrhea, heartburn, melena, nausea and vomiting.  Genitourinary: Negative.   Musculoskeletal: Negative for joint pain and myalgias.  Skin: Negative for rash.  Neurological: Negative for dizziness, tingling, sensory change, weakness and headaches.  Endo/Heme/Allergies: Negative for polydipsia.  Psychiatric/Behavioral: Negative.   All other systems reviewed and are negative.   Physical Exam: BP 110/76   Pulse 72   Temp (!) 97.2 F (36.2 C)   Ht 5' 4.5" (1.638 m)   Wt 150 lb (68 kg)   LMP 08/11/2013   SpO2 95%   BMI 25.35 kg/m  Wt Readings from Last 3 Encounters:  11/24/19 150 lb (68 kg)  05/21/19 150 lb (68 kg)  11/16/18 145 lb 12.8 oz (66.1 kg)   General Appearance: Well nourished, in no apparent distress. Eyes: PERRLA, EOMs, conjunctiva no swelling or erythema Sinuses: No Frontal/maxillary tenderness ENT/Mouth: Ext aud canals clear, TMs without erythema, bulging. No erythema, swelling, or exudate on post pharynx.  Tonsils not swollen or erythematous. Hearing normal.  Neck: Supple, thyroid normal.  Respiratory: Respiratory effort normal,  BS equal bilaterally without rales, rhonchi, wheezing or stridor.  Cardio: RRR with no RGs; no murmer noted today.. Brisk peripheral pulses without edema.  Abdomen: Soft, + BS.  Non tender, no guarding, rebound, hernias, masses. Lymphatics: Non tender without lymphadenopathy.  Musculoskeletal: Full ROM, 5/5 strength, Normal gait Skin: Warm, dry without rashes, lesions, ecchymosis.  Neuro: Cranial nerves intact. No cerebellar symptoms.  Psych: Awake and oriented X 3, normal affect, Insight and Judgment appropriate.    Izora Ribas, NP 9:57 AM [facility]

## 2019-11-24 ENCOUNTER — Telehealth: Payer: Self-pay | Admitting: *Deleted

## 2019-11-24 ENCOUNTER — Other Ambulatory Visit: Payer: Self-pay

## 2019-11-24 ENCOUNTER — Encounter: Payer: Self-pay | Admitting: Adult Health

## 2019-11-24 ENCOUNTER — Ambulatory Visit: Payer: 59 | Admitting: Adult Health

## 2019-11-24 VITALS — BP 110/76 | HR 72 | Temp 97.2°F | Ht 64.5 in | Wt 150.0 lb

## 2019-11-24 DIAGNOSIS — E1122 Type 2 diabetes mellitus with diabetic chronic kidney disease: Secondary | ICD-10-CM | POA: Diagnosis not present

## 2019-11-24 DIAGNOSIS — C541 Malignant neoplasm of endometrium: Secondary | ICD-10-CM | POA: Insufficient documentation

## 2019-11-24 DIAGNOSIS — E785 Hyperlipidemia, unspecified: Secondary | ICD-10-CM

## 2019-11-24 DIAGNOSIS — E139 Other specified diabetes mellitus without complications: Secondary | ICD-10-CM

## 2019-11-24 DIAGNOSIS — E1169 Type 2 diabetes mellitus with other specified complication: Secondary | ICD-10-CM | POA: Diagnosis not present

## 2019-11-24 DIAGNOSIS — Z79899 Other long term (current) drug therapy: Secondary | ICD-10-CM

## 2019-11-24 DIAGNOSIS — E559 Vitamin D deficiency, unspecified: Secondary | ICD-10-CM

## 2019-11-24 DIAGNOSIS — E890 Postprocedural hypothyroidism: Secondary | ICD-10-CM

## 2019-11-24 DIAGNOSIS — N182 Chronic kidney disease, stage 2 (mild): Secondary | ICD-10-CM

## 2019-11-24 DIAGNOSIS — I10 Essential (primary) hypertension: Secondary | ICD-10-CM | POA: Diagnosis not present

## 2019-11-24 NOTE — Patient Instructions (Addendum)
Goals    . HEMOGLOBIN A1C < 7.0    . LDL CALC < 70       Increase atorvastatin to 80 mg daily      High-Fiber Diet Fiber, also called dietary fiber, is a type of carbohydrate that is found in fruits, vegetables, whole grains, and beans. A high-fiber diet can have many health benefits. Your health care provider may recommend a high-fiber diet to help:  Prevent constipation. Fiber can make your bowel movements more regular.  Lower your cholesterol.  Relieve the following conditions: ? Swelling of veins in the anus (hemorrhoids). ? Swelling and irritation (inflammation) of specific areas of the digestive tract (uncomplicated diverticulosis). ? A problem of the large intestine (colon) that sometimes causes pain and diarrhea (irritable bowel syndrome, IBS).  Prevent overeating as part of a weight-loss plan.  Prevent heart disease, type 2 diabetes, and certain cancers. What is my plan? The recommended daily fiber intake in grams (g) includes:  38 g for men age 6 or younger.  30 g for men over age 64.  15 g for women age 44 or younger.  21 g for women over age 5. You can get the recommended daily intake of dietary fiber by:  Eating a variety of fruits, vegetables, grains, and beans.  Taking a fiber supplement, if it is not possible to get enough fiber through your diet. What do I need to know about a high-fiber diet?  It is better to get fiber through food sources rather than from fiber supplements. There is not a lot of research about how effective supplements are.  Always check the fiber content on the nutrition facts label of any prepackaged food. Look for foods that contain 5 g of fiber or more per serving.  Talk with a diet and nutrition specialist (dietitian) if you have questions about specific foods that are recommended or not recommended for your medical condition, especially if those foods are not listed below.  Gradually increase how much fiber you consume. If  you increase your intake of dietary fiber too quickly, you may have bloating, cramping, or gas.  Drink plenty of water. Water helps you to digest fiber. What are tips for following this plan?  Eat a wide variety of high-fiber foods.  Make sure that half of the grains that you eat each day are whole grains.  Eat breads and cereals that are made with whole-grain flour instead of refined flour or white flour.  Eat brown rice, bulgur wheat, or millet instead of white rice.  Start the day with a breakfast that is high in fiber, such as a cereal that contains 5 g of fiber or more per serving.  Use beans in place of meat in soups, salads, and pasta dishes.  Eat high-fiber snacks, such as berries, raw vegetables, nuts, and popcorn.  Choose whole fruits and vegetables instead of processed forms like juice or sauce. What foods can I eat?  Fruits Berries. Pears. Apples. Oranges. Avocado. Prunes and raisins. Dried figs. Vegetables Sweet potatoes. Spinach. Kale. Artichokes. Cabbage. Broccoli. Cauliflower. Green peas. Carrots. Squash. Grains Whole-grain breads. Multigrain cereal. Oats and oatmeal. Brown rice. Barley. Bulgur wheat. [place]. Quinoa. Bran muffins. Popcorn. Rye wafer crackers. Meats and other proteins Navy, kidney, and pinto beans. Soybeans. Split peas. Lentils. Nuts and seeds. Dairy Fiber-fortified yogurt. Beverages Fiber-fortified soy milk. Fiber-fortified orange juice. Other foods Fiber bars. The items listed above may not be a complete list of recommended foods and beverages. Contact a dietitian  for more options. What foods are not recommended? Fruits Fruit juice. Cooked, strained fruit. Vegetables Fried potatoes. Canned vegetables. Well-cooked vegetables. Grains White bread. Pasta made with refined flour. White rice. Meats and other proteins Fatty cuts of meat. Fried chicken or fried fish. Dairy Milk. Yogurt. Cream cheese. Sour cream. Fats and  oils Butters. Beverages Soft drinks. Other foods Cakes and pastries. The items listed above may not be a complete list of foods and beverages to avoid. Contact a dietitian for more information. Summary  Fiber is a type of carbohydrate. It is found in fruits, vegetables, whole grains, and beans.  There are many health benefits of eating a high-fiber diet, such as preventing constipation, lowering blood cholesterol, helping with weight loss, and reducing your risk of heart disease, diabetes, and certain cancers.  Gradually increase your intake of fiber. Increasing too fast can result in cramping, bloating, and gas. Drink plenty of water while you increase your fiber.  The best sources of fiber include whole fruits and vegetables, whole grains, nuts, seeds, and beans. This information is not intended to replace advice given to you by your health care provider. Make sure you discuss any questions you have with your health care provider. Document Revised: 12/23/2016 Document Reviewed: 12/23/2016 Elsevier Patient Education  2020 [employer].

## 2019-11-24 NOTE — Telephone Encounter (Signed)
Called and spoke with the patient, scheduled her for a new patient appt. Gave the patient the address and phone number for the clinic. Also gave the patient the policy for visitors and mask.

## 2019-11-25 DIAGNOSIS — C541 Malignant neoplasm of endometrium: Secondary | ICD-10-CM

## 2019-11-25 NOTE — Progress Notes (Signed)
GYNECOLOGIC ONCOLOGY NEW PATIENT CONSULTATION   Patient Name: Mary Frank  Patient Age: 64 y.o. Date of Service: 11/26/19 Referring Provider: Dr. [name]  Primary Care Provider: Unk Pinto, MD Consulting Provider: Jeral Pinch, MD   Assessment/Plan:  Postmenopausal patient with clinical stage I grade 1 endometrial adenocarcinoma.  We reviewed the nature of endometrial cancer and its recommended surgical staging, including total hysterectomy, bilateral salpingo-oophorectomy, and lymph node assessment. The patient is a suitable candidate for staging via a minimally invasive approach to surgery.  We reviewed that robotic assistance would be used to complete the surgery.   We discussed that most endometrial cancer is detected early and that decisions regarding adjuvant therapy will be made based on her final pathology.   We reviewed the sentinel lymph node technique. Risks and benefits of sentinel lymph node biopsy was reviewed. We reviewed the technique and ICG dye. The patient DOES NOT have an iodine allergy or known liver dysfunction. We reviewed the false negative rate (0.4%), and that 3% of patients with metastatic disease will not have it detected by SLN biopsy in endometrial cancer. A low risk of allergic reaction to the dye, <0.2% for ICG, has been reported. We also discussed that in the case of failed mapping, which occurs 40% of the time, a bilateral or unilateral lymphadenectomy will be performed at the surgeon's discretion.   Potential benefits of sentinel nodes including a higher detection rate for metastasis due to ultrastaging and potential reduction in operative morbidity. However, there remains uncertainty as to the role for treatment of micrometastatic disease. Further, the benefit of operative morbidity associated with the SLN technique in endometrial cancer is not yet completely known. In other patient populations (e.g. the cervical cancer population) there has been observed  reductions in morbidity with SLN biopsy compared to pelvic lymphadenectomy. Lymphedema, nerve dysfunction and lymphocysts are all potential risks with the SLN technique as with complete lymphadenectomy. Additional risks to the patient include the risk of damage to an internal organ while operating in an altered view (e.g. the black and white image of the robotic fluorescence imaging mode).   We discussed plan for a robotic assisted hysterectomy, bilateral salpingo-oophorectomy, sentinel lymph node evaluation, possible lymph node dissection, possible laparotomy. The risks of surgery were discussed in detail and she understands these to include infection; wound separation; hernia; vaginal cuff separation, injury to adjacent organs such as bowel, bladder, blood vessels, ureters and nerves; bleeding which may require blood transfusion; anesthesia risk; thromboembolic events; possible death; unforeseen complications; possible need for re-exploration; medical complications such as heart attack, stroke, pleural effusion and pneumonia; and, if full lymphadenectomy is performed the risk of lymphedema and lymphocyst. The patient will receive DVT and antibiotic prophylaxis as indicated. She voiced a clear understanding. She had the opportunity to ask questions. Perioperative instructions were reviewed with her. Prescriptions for post-op medications were sent to her pharmacy of choice.  A copy of this note was sent to the patient's referring provider.   55 minutes of total time was spent for this patient encounter, including preparation, face-to-face counseling with the patient and coordination of care, and documentation of the encounter.   Jeral Pinch, MD  Division of Gynecologic Oncology  Department of Obstetrics and Gynecology  [facility]  ___________________________________________  Chief Complaint: Chief Complaint  Patient presents with  . endometrial cancer    History  of Present Illness:  Mary Frank is a 64 y.o. y.o. female who is seen in consultation at  the request of Dr. [name] for an evaluation of newly diagnosed uterine cancer.  Patient reports undergoing an endometrial ablation over 6 years ago.  Review of epic shows that she had an endometrial biopsy in 2010 as well as 2012 with no hyperplasia or atypia noted.  Her bleeding after her ablation slowed and eventually stopped completely.  Approximately 2 years ago, she began having bleeding like her menses again.  Over time, the frequency and duration of this bleeding has increased.  A couple of months ago, she had an episode of bleeding that lasted for 3 weeks.  She notes some cramping associated with her bleeding.  She underwent a pelvic ultrasound and ultimately a hysteroscopy with endometrial sampling.  Since the procedure, the patient reports feeling well.  She has had some bleeding.  She endorses a good appetite without any change to her weight.  She denies nausea, emesis, or urinary symptoms.  She reports regular bowel function.  Patient is overall healthy.  She has a diagnosis of type 1 diabetes and self injects Lantus and Humalog.  She has a monitor for her blood sugars and normally runs from 100-1 10.  If anything, at night, she is awoken by her monitor for being hypoglycemic.  Last hemoglobin A1c in July of this year was 6.2. She also has hypothyroidism.  She denies any significant cardiac or pulmonary disease.  She has never had any abdominal surgery.  She lives in [place] with her husband.  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   PAST MEDICAL HISTORY:  Past Medical History:  Diagnosis Date  . Diabetes mellitus without complication ([place])   . Endometrial adenocarcinoma ([place])   . Hyperlipidemia   . Hypertension   . Iron deficiency anemia 01/17/2011  . Menopause   . Thyroid disease   . Type II or unspecified type diabetes mellitus without mention of  complication, not stated as uncontrolled 02/04/2013  . Varicose veins   . Vitamin D deficiency      PAST SURGICAL HISTORY:  Past Surgical History:  Procedure Laterality Date  . ENDOMETRIAL ABLATION W/ NOVASURE  2015   Dr. [name]  . VEIN LIGATION AND STRIPPING Left 2017  . VEIN LIGATION AND STRIPPING Right 2017    OB/GYN HISTORY:  OB History  Gravida Para Term Preterm AB Living  2 2       2   SAB TAB Ectopic Multiple Live Births               # Outcome Date GA Lbr Len/2nd Weight Sex Delivery Anes PTL Lv  2 Para           1 Para             Patient's last menstrual period was 08/11/2013.  Age at menarche: 28 Age at menopause: Unsure, see HPI Hx of HRT: Yes Hx of STDs: Denies Last pap: 10/2019 - negative History of abnormal pap smears: yes, remote history  SCREENING STUDIES:  Last mammogram: 2018  Last colonoscopy: 2010 Last bone mineral density: 2020  MEDICATIONS: Outpatient Encounter Medications as of 11/26/2019  Medication Sig  . aspirin EC 81 MG tablet Take 81 mg by mouth daily. Swallow whole.  Marland Kitchen atorvastatin (LIPITOR) 80 MG tablet Take 1 tablet Daily for Cholesterol  . Calcium Citrate-Vitamin D (CALCIUM CITRATE + D3 PO) Take 600 mg by mouth.  . Cholecalciferol 125 MCG (5000 UT) TABS Take 5,000 Units by mouth daily.  . Continuous Blood  Gluc Sensor (FREESTYLE LIBRE 2 SENSOR) MISC Inject 1 sensor to the skin every 14 days for continuous glucose monitoring.  Marland Kitchen estradiol (ESTRACE) 2 MG tablet Take 2 mg by mouth daily.   . ferrous fumarate (HEMOCYTE - 106 MG FE) 325 (106 FE) MG TABS Take 1 tablet by mouth daily.  . insulin glargine (LANTUS) 100 UNIT/ML Solostar Pen Inject 10 Units into the skin daily.   . insulin lispro (HUMALOG KWIKPEN) 100 UNIT/ML KwikPen Inject into the skin. Sliding scale: under 120 = 0 units  120 - 170 = 1 units  171 - 220 = 2 units  221 - 270 = 3 units  271 - 320 = 4 units  over 320 = 4 units  . levothyroxine (SYNTHROID) 137 MCG tablet Take 1  tablet daily on an empty stomach with only water for 30 minutes & no Antacid meds, Calcium or Magnesium for 4 hours & avoid Biotin  . lisinopril (ZESTRIL) 10 MG tablet Take 1 tablet Daily for BP & Diabetic Kidney Protectipon  . Magnesium 500 MG CAPS Take 2 capsules by mouth daily.   . medroxyPROGESTERone (PROVERA) 5 MG tablet Take 1 tablet 10 days every 3 months  . Omega-3 Fatty Acids (FISH OIL) 1200 MG CAPS Take 1 capsule (1,200 mg total) by mouth 2 (two) times daily.  . progesterone (PROMETRIUM) 100 MG capsule Take 100 mg by mouth daily.  Marland Kitchen ibuprofen (ADVIL) 800 MG tablet Take 1 tablet (800 mg total) by mouth every 8 (eight) hours as needed for moderate pain. For AFTER surgery  . senna-docusate (SENOKOT-S) 8.6-50 MG tablet Take 2 tablets by mouth at bedtime. For AFTER surgery, do not take if having diarrhea  . traMADol (ULTRAM) 50 MG tablet Take 1 tablet (50 mg total) by mouth every 6 (six) hours as needed for severe pain. For AFTER surgery, do not take and drive  . [DISCONTINUED] atorvastatin (LIPITOR) 80 MG tablet Take 1/2 tablet by mouth daily  . [DISCONTINUED] cholecalciferol (VITAMIN D) 1000 UNITS tablet Take 5,000 Units by mouth daily.   . [DISCONTINUED] insulin glargine (LANTUS) 100 UNIT/ML injection Inject into the skin daily. Injects 11 units daily   No facility-administered encounter medications on file as of 11/26/2019.    ALLERGIES:  No Known Allergies   FAMILY HISTORY:  Family History  Problem Relation Age of Onset  . Diabetes Mother   . Fibromyalgia Mother   . Arrhythmia Mother        Required chest compressions -  . Osteoporosis Mother   . Thyroid disease Father        hypothyroid  . Heart attack Father 73  . Thyroid disease Sister   . Osteoporosis Sister   . Anxiety disorder Daughter   . Colitis Daughter   . Depression Son   . Irritable bowel syndrome Son   . Breast cancer Maternal Grandmother 104     SOCIAL HISTORY:    Social Connections:   . Frequency of  Communication with Friends and Family: Not on file  . Frequency of Social Gatherings with Friends and Family: Not on file  . Attends Religious Services: Not on file  . Active Member of Clubs or Organizations: Not on file  . Attends Archivist Meetings: Not on file  . Marital Status: Not on file    REVIEW OF SYSTEMS:  Denies appetite changes, fevers, chills, fatigue, unexplained weight changes. Denies hearing loss, neck lumps or masses, mouth sores, ringing in ears or voice changes. Denies cough or  wheezing.  Denies shortness of breath. Denies chest pain or palpitations. Denies leg swelling. Denies abdominal distention, pain, blood in stools, constipation, diarrhea, nausea, vomiting, or early satiety. Denies pain with intercourse, dysuria, frequency, hematuria or incontinence. Denies hot flashes, pelvic pain, vaginal bleeding or vaginal discharge.   Denies joint pain, back pain or muscle pain/cramps. Denies itching, rash, or wounds. Denies dizziness, headaches, numbness or seizures. Denies swollen lymph nodes or glands, denies easy bruising or bleeding. Denies anxiety, depression, confusion, or decreased concentration.  Physical Exam:  Vital Signs for this encounter:  Blood pressure 114/72, pulse 72, temperature 98.2 F (36.8 C), temperature source Tympanic, resp. rate 18, height 5\' 4"  (1.626 m), weight 150 lb 9.6 oz (68.3 kg), last menstrual period 08/11/2013, SpO2 98 %. Body mass index is 25.85 kg/m. General: Alert, oriented, no acute distress.  HEENT: Normocephalic, atraumatic. Sclera anicteric.  Chest: Clear to auscultation bilaterally. No wheezes, rhonchi, or rales. Cardiovascular: Regular rate and rhythm, no murmurs, rubs, or gallops.  Abdomen: Normoactive bowel sounds. Soft, nondistended, nontender to palpation. No masses or hepatosplenomegaly appreciated. No palpable fluid wave.  Extremities: Grossly normal range of motion. Warm, well perfused. No edema bilaterally.   Skin: No rashes or lesions.  Lymphatics: No cervical, supraclavicular, or inguinal adenopathy.  GU:  Normal external female genitalia. No lesions. No discharge or bleeding.             Bladder/urethra:  No lesions or masses, well supported bladder             Vagina: well rugated, no lesions or masses.             Cervix: Normal appearing, no lesions. Ectropion noted on the cervix.             Uterus: Small, mobile, no parametrial involvement or nodularity.             Adnexa: No masses appreciated.  Rectal: No nodularity.  LABORATORY AND RADIOLOGIC DATA:  Outside medical records were reviewed to synthesize the above history, along with the history and physical obtained during the visit.   Lab Results  Component Value Date   WBC 5.5 05/21/2019   HGB 14.0 05/21/2019   HCT 43.2 05/21/2019   PLT 236 05/21/2019   GLUCOSE 82 05/21/2019   CHOL 162 05/21/2019   TRIG 60 05/21/2019   HDL 77 05/21/2019   LDLCALC 71 05/21/2019   ALT 26 05/21/2019   AST 20 05/21/2019   NA 142 05/21/2019   K 4.1 05/21/2019   CL 106 05/21/2019   CREATININE 0.70 05/21/2019   BUN 23 05/21/2019   CO2 29 05/21/2019   TSH 0.48 05/21/2019   HGBA1C 6.4 (H) 05/21/2019   MICROALBUR 0.3 05/21/2019   [facility]: Uterus 11.6 x 7.3 x 8.3 cm with an endometrial lining measuring 1.5 cm.  Several uterine fibroids noted, the largest measuring 6.4 x 5.8 cm.  Endometrial curettings from [facility] on 9/16: Endometrial adenocarcinoma, FIGO grade 1.

## 2019-11-25 NOTE — H&P (View-Only) (Signed)
GYNECOLOGIC ONCOLOGY NEW PATIENT CONSULTATION   Patient Name: Mary Frank  Patient Age: 64 y.o. Date of Service: 11/26/19 Referring Provider: Dr. [name]  Primary Care Provider: Unk Pinto, MD Consulting Provider: Jeral Pinch, MD   Assessment/Plan:  Postmenopausal patient with clinical stage I grade 1 endometrial adenocarcinoma.  We reviewed the nature of endometrial cancer and its recommended surgical staging, including total hysterectomy, bilateral salpingo-oophorectomy, and lymph node assessment. The patient is a suitable candidate for staging via a minimally invasive approach to surgery.  We reviewed that robotic assistance would be used to complete the surgery.   We discussed that most endometrial cancer is detected early and that decisions regarding adjuvant therapy will be made based on her final pathology.   We reviewed the sentinel lymph node technique. Risks and benefits of sentinel lymph node biopsy was reviewed. We reviewed the technique and ICG dye. The patient DOES NOT have an iodine allergy or known liver dysfunction. We reviewed the false negative rate (0.4%), and that 3% of patients with metastatic disease will not have it detected by SLN biopsy in endometrial cancer. A low risk of allergic reaction to the dye, <0.2% for ICG, has been reported. We also discussed that in the case of failed mapping, which occurs 40% of the time, a bilateral or unilateral lymphadenectomy will be performed at the surgeon's discretion.   Potential benefits of sentinel nodes including a higher detection rate for metastasis due to ultrastaging and potential reduction in operative morbidity. However, there remains uncertainty as to the role for treatment of micrometastatic disease. Further, the benefit of operative morbidity associated with the SLN technique in endometrial cancer is not yet completely known. In other patient populations (e.g. the cervical cancer population) there has been observed  reductions in morbidity with SLN biopsy compared to pelvic lymphadenectomy. Lymphedema, nerve dysfunction and lymphocysts are all potential risks with the SLN technique as with complete lymphadenectomy. Additional risks to the patient include the risk of damage to an internal organ while operating in an altered view (e.g. the black and white image of the robotic fluorescence imaging mode).   We discussed plan for a robotic assisted hysterectomy, bilateral salpingo-oophorectomy, sentinel lymph node evaluation, possible lymph node dissection, possible laparotomy. The risks of surgery were discussed in detail and she understands these to include infection; wound separation; hernia; vaginal cuff separation, injury to adjacent organs such as bowel, bladder, blood vessels, ureters and nerves; bleeding which may require blood transfusion; anesthesia risk; thromboembolic events; possible death; unforeseen complications; possible need for re-exploration; medical complications such as heart attack, stroke, pleural effusion and pneumonia; and, if full lymphadenectomy is performed the risk of lymphedema and lymphocyst. The patient will receive DVT and antibiotic prophylaxis as indicated. She voiced a clear understanding. She had the opportunity to ask questions. Perioperative instructions were reviewed with her. Prescriptions for post-op medications were sent to her pharmacy of choice.  A copy of this note was sent to the patient's referring provider.   55 minutes of total time was spent for this patient encounter, including preparation, face-to-face counseling with the patient and coordination of care, and documentation of the encounter.   Jeral Pinch, MD  Division of Gynecologic Oncology  Department of Obstetrics and Gynecology  [facility]  ___________________________________________  Chief Complaint: Chief Complaint  Patient presents with  . endometrial cancer    History  of Present Illness:  Mary Frank is a 64 y.o. y.o. female who is seen in consultation at  the request of Dr. [name] for an evaluation of newly diagnosed uterine cancer.  Patient reports undergoing an endometrial ablation over 6 years ago.  Review of epic shows that she had an endometrial biopsy in 2010 as well as 2012 with no hyperplasia or atypia noted.  Her bleeding after her ablation slowed and eventually stopped completely.  Approximately 2 years ago, she began having bleeding like her menses again.  Over time, the frequency and duration of this bleeding has increased.  A couple of months ago, she had an episode of bleeding that lasted for 3 weeks.  She notes some cramping associated with her bleeding.  She underwent a pelvic ultrasound and ultimately a hysteroscopy with endometrial sampling.  Since the procedure, the patient reports feeling well.  She has had some bleeding.  She endorses a good appetite without any change to her weight.  She denies nausea, emesis, or urinary symptoms.  She reports regular bowel function.  Patient is overall healthy.  She has a diagnosis of type 1 diabetes and self injects Lantus and Humalog.  She has a monitor for her blood sugars and normally runs from 100-1 10.  If anything, at night, she is awoken by her monitor for being hypoglycemic.  Last hemoglobin A1c in July of this year was 6.2. She also has hypothyroidism.  She denies any significant cardiac or pulmonary disease.  She has never had any abdominal surgery.  She lives in [place] with her husband.  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   PAST MEDICAL HISTORY:  Past Medical History:  Diagnosis Date  . Diabetes mellitus without complication ([place])   . Endometrial adenocarcinoma ([place])   . Hyperlipidemia   . Hypertension   . Iron deficiency anemia 01/17/2011  . Menopause   . Thyroid disease   . Type II or unspecified type diabetes mellitus without mention of  complication, not stated as uncontrolled 02/04/2013  . Varicose veins   . Vitamin D deficiency      PAST SURGICAL HISTORY:  Past Surgical History:  Procedure Laterality Date  . ENDOMETRIAL ABLATION W/ NOVASURE  2015   Dr. [name]  . VEIN LIGATION AND STRIPPING Left 2017  . VEIN LIGATION AND STRIPPING Right 2017    OB/GYN HISTORY:  OB History  Gravida Para Term Preterm AB Living  2 2       2   SAB TAB Ectopic Multiple Live Births               # Outcome Date GA Lbr Len/2nd Weight Sex Delivery Anes PTL Lv  2 Para           1 Para             Patient's last menstrual period was 08/11/2013.  Age at menarche: 67 Age at menopause: Unsure, see HPI Hx of HRT: Yes Hx of STDs: Denies Last pap: 10/2019 - negative History of abnormal pap smears: yes, remote history  SCREENING STUDIES:  Last mammogram: 2018  Last colonoscopy: 2010 Last bone mineral density: 2020  MEDICATIONS: Outpatient Encounter Medications as of 11/26/2019  Medication Sig  . aspirin EC 81 MG tablet Take 81 mg by mouth daily. Swallow whole.  Marland Kitchen atorvastatin (LIPITOR) 80 MG tablet Take 1 tablet Daily for Cholesterol  . Calcium Citrate-Vitamin D (CALCIUM CITRATE + D3 PO) Take 600 mg by mouth.  . Cholecalciferol 125 MCG (5000 UT) TABS Take 5,000 Units by mouth daily.  . Continuous Blood  Gluc Sensor (FREESTYLE LIBRE 2 SENSOR) MISC Inject 1 sensor to the skin every 14 days for continuous glucose monitoring.  Marland Kitchen estradiol (ESTRACE) 2 MG tablet Take 2 mg by mouth daily.   . ferrous fumarate (HEMOCYTE - 106 MG FE) 325 (106 FE) MG TABS Take 1 tablet by mouth daily.  . insulin glargine (LANTUS) 100 UNIT/ML Solostar Pen Inject 10 Units into the skin daily.   . insulin lispro (HUMALOG KWIKPEN) 100 UNIT/ML KwikPen Inject into the skin. Sliding scale: under 120 = 0 units  120 - 170 = 1 units  171 - 220 = 2 units  221 - 270 = 3 units  271 - 320 = 4 units  over 320 = 4 units  . levothyroxine (SYNTHROID) 137 MCG tablet Take 1  tablet daily on an empty stomach with only water for 30 minutes & no Antacid meds, Calcium or Magnesium for 4 hours & avoid Biotin  . lisinopril (ZESTRIL) 10 MG tablet Take 1 tablet Daily for BP & Diabetic Kidney Protectipon  . Magnesium 500 MG CAPS Take 2 capsules by mouth daily.   . medroxyPROGESTERone (PROVERA) 5 MG tablet Take 1 tablet 10 days every 3 months  . Omega-3 Fatty Acids (FISH OIL) 1200 MG CAPS Take 1 capsule (1,200 mg total) by mouth 2 (two) times daily.  . progesterone (PROMETRIUM) 100 MG capsule Take 100 mg by mouth daily.  Marland Kitchen ibuprofen (ADVIL) 800 MG tablet Take 1 tablet (800 mg total) by mouth every 8 (eight) hours as needed for moderate pain. For AFTER surgery  . senna-docusate (SENOKOT-S) 8.6-50 MG tablet Take 2 tablets by mouth at bedtime. For AFTER surgery, do not take if having diarrhea  . traMADol (ULTRAM) 50 MG tablet Take 1 tablet (50 mg total) by mouth every 6 (six) hours as needed for severe pain. For AFTER surgery, do not take and drive  . [DISCONTINUED] atorvastatin (LIPITOR) 80 MG tablet Take 1/2 tablet by mouth daily  . [DISCONTINUED] cholecalciferol (VITAMIN D) 1000 UNITS tablet Take 5,000 Units by mouth daily.   . [DISCONTINUED] insulin glargine (LANTUS) 100 UNIT/ML injection Inject into the skin daily. Injects 11 units daily   No facility-administered encounter medications on file as of 11/26/2019.    ALLERGIES:  No Known Allergies   FAMILY HISTORY:  Family History  Problem Relation Age of Onset  . Diabetes Mother   . Fibromyalgia Mother   . Arrhythmia Mother        Required chest compressions -  . Osteoporosis Mother   . Thyroid disease Father        hypothyroid  . Heart attack Father 37  . Thyroid disease Sister   . Osteoporosis Sister   . Anxiety disorder Daughter   . Colitis Daughter   . Depression Son   . Irritable bowel syndrome Son   . Breast cancer Maternal Grandmother 89     SOCIAL HISTORY:    Social Connections:   . Frequency of  Communication with Friends and Family: Not on file  . Frequency of Social Gatherings with Friends and Family: Not on file  . Attends Religious Services: Not on file  . Active Member of Clubs or Organizations: Not on file  . Attends Archivist Meetings: Not on file  . Marital Status: Not on file    REVIEW OF SYSTEMS:  Denies appetite changes, fevers, chills, fatigue, unexplained weight changes. Denies hearing loss, neck lumps or masses, mouth sores, ringing in ears or voice changes. Denies cough or  wheezing.  Denies shortness of breath. Denies chest pain or palpitations. Denies leg swelling. Denies abdominal distention, pain, blood in stools, constipation, diarrhea, nausea, vomiting, or early satiety. Denies pain with intercourse, dysuria, frequency, hematuria or incontinence. Denies hot flashes, pelvic pain, vaginal bleeding or vaginal discharge.   Denies joint pain, back pain or muscle pain/cramps. Denies itching, rash, or wounds. Denies dizziness, headaches, numbness or seizures. Denies swollen lymph nodes or glands, denies easy bruising or bleeding. Denies anxiety, depression, confusion, or decreased concentration.  Physical Exam:  Vital Signs for this encounter:  Blood pressure 114/72, pulse 72, temperature 98.2 F (36.8 C), temperature source Tympanic, resp. rate 18, height 5\' 4"  (1.626 m), weight 150 lb 9.6 oz (68.3 kg), last menstrual period 08/11/2013, SpO2 98 %. Body mass index is 25.85 kg/m. General: Alert, oriented, no acute distress.  HEENT: Normocephalic, atraumatic. Sclera anicteric.  Chest: Clear to auscultation bilaterally. No wheezes, rhonchi, or rales. Cardiovascular: Regular rate and rhythm, no murmurs, rubs, or gallops.  Abdomen: Normoactive bowel sounds. Soft, nondistended, nontender to palpation. No masses or hepatosplenomegaly appreciated. No palpable fluid wave.  Extremities: Grossly normal range of motion. Warm, well perfused. No edema bilaterally.   Skin: No rashes or lesions.  Lymphatics: No cervical, supraclavicular, or inguinal adenopathy.  GU:  Normal external female genitalia. No lesions. No discharge or bleeding.             Bladder/urethra:  No lesions or masses, well supported bladder             Vagina: well rugated, no lesions or masses.             Cervix: Normal appearing, no lesions. Ectropion noted on the cervix.             Uterus: Small, mobile, no parametrial involvement or nodularity.             Adnexa: No masses appreciated.  Rectal: No nodularity.  LABORATORY AND RADIOLOGIC DATA:  Outside medical records were reviewed to synthesize the above history, along with the history and physical obtained during the visit.   Lab Results  Component Value Date   WBC 5.5 05/21/2019   HGB 14.0 05/21/2019   HCT 43.2 05/21/2019   PLT 236 05/21/2019   GLUCOSE 82 05/21/2019   CHOL 162 05/21/2019   TRIG 60 05/21/2019   HDL 77 05/21/2019   LDLCALC 71 05/21/2019   ALT 26 05/21/2019   AST 20 05/21/2019   NA 142 05/21/2019   K 4.1 05/21/2019   CL 106 05/21/2019   CREATININE 0.70 05/21/2019   BUN 23 05/21/2019   CO2 29 05/21/2019   TSH 0.48 05/21/2019   HGBA1C 6.4 (H) 05/21/2019   MICROALBUR 0.3 05/21/2019   [facility] [place] [facility]: Uterus 11.6 x 7.3 x 8.3 cm with an endometrial lining measuring 1.5 cm.  Several uterine fibroids noted, the largest measuring 6.4 x 5.8 cm.  Endometrial curettings from [facility] on 9/16: Endometrial adenocarcinoma, FIGO grade 1.

## 2019-11-26 ENCOUNTER — Other Ambulatory Visit: Payer: Self-pay

## 2019-11-26 ENCOUNTER — Other Ambulatory Visit: Payer: Self-pay | Admitting: Gynecologic Oncology

## 2019-11-26 ENCOUNTER — Inpatient Hospital Stay: Payer: 59 | Attending: Gynecologic Oncology | Admitting: Gynecologic Oncology

## 2019-11-26 ENCOUNTER — Encounter: Payer: Self-pay | Admitting: Gynecologic Oncology

## 2019-11-26 VITALS — BP 114/72 | HR 72 | Temp 98.2°F | Resp 18 | Ht 64.0 in | Wt 150.6 lb

## 2019-11-26 DIAGNOSIS — E109 Type 1 diabetes mellitus without complications: Secondary | ICD-10-CM | POA: Insufficient documentation

## 2019-11-26 DIAGNOSIS — I10 Essential (primary) hypertension: Secondary | ICD-10-CM | POA: Diagnosis not present

## 2019-11-26 DIAGNOSIS — E559 Vitamin D deficiency, unspecified: Secondary | ICD-10-CM | POA: Diagnosis not present

## 2019-11-26 DIAGNOSIS — E039 Hypothyroidism, unspecified: Secondary | ICD-10-CM | POA: Insufficient documentation

## 2019-11-26 DIAGNOSIS — C541 Malignant neoplasm of endometrium: Secondary | ICD-10-CM | POA: Diagnosis present

## 2019-11-26 DIAGNOSIS — Z780 Asymptomatic menopausal state: Secondary | ICD-10-CM | POA: Diagnosis not present

## 2019-11-26 DIAGNOSIS — Z7982 Long term (current) use of aspirin: Secondary | ICD-10-CM | POA: Insufficient documentation

## 2019-11-26 DIAGNOSIS — Z79899 Other long term (current) drug therapy: Secondary | ICD-10-CM | POA: Diagnosis not present

## 2019-11-26 DIAGNOSIS — E785 Hyperlipidemia, unspecified: Secondary | ICD-10-CM | POA: Insufficient documentation

## 2019-11-26 DIAGNOSIS — Z794 Long term (current) use of insulin: Secondary | ICD-10-CM | POA: Insufficient documentation

## 2019-11-26 MED ORDER — SENNOSIDES-DOCUSATE SODIUM 8.6-50 MG PO TABS: 2.0000 | ORAL_TABLET | Freq: Every day | ORAL | 0 refills | Status: DC

## 2019-11-26 MED ORDER — TRAMADOL HCL 50 MG PO TABS: 50.0000 mg | ORAL_TABLET | Freq: Four times a day (QID) | ORAL | 0 refills | Status: DC | PRN

## 2019-11-26 MED ORDER — IBUPROFEN 800 MG PO TABS: 800.0000 mg | ORAL_TABLET | Freq: Three times a day (TID) | ORAL | 0 refills | Status: DC | PRN

## 2019-11-26 NOTE — Patient Instructions (Signed)
Preparing for your Surgery  Plan for surgery on December 13, 2019 with Dr. Jeral Pinch at [facility]. You will be scheduled for a robotic assisted total laparoscopic hysterectomy, bilateral salpingo-oophorectomy, sentinel lymph node biopsy, possible lymph node dissection, possible laparotomy.   Pre-operative Testing -You will receive a phone call from presurgical testing at [facility] to arrange for a pre-operative appointment over the phone, lab appointment, and COVID test. The COVID test normally happens 3 days prior to the surgery and they ask that you self quarantine after the test up until surgery to decrease chance of exposure.  -Bring your insurance card, copy of an advanced directive if applicable, medication list  -At that visit, you will be asked to sign a consent for a possible blood transfusion in case a transfusion becomes necessary during surgery.  The need for a blood transfusion is rare but having consent is a necessary part of your care.     -You should not be taking blood thinners or aspirin at least ten days prior to surgery unless instructed by your surgeon.  -Do not take supplements such as fish oil (omega 3), red yeast rice, turmeric before your surgery.   Day Before Surgery at [place] will be asked to take in a light diet the day before surgery. You will be advised you can have clear liquids up until 3 hours before your surgery.    Eat a light diet the day before surgery.  Examples including soups, broths, toast, yogurt, mashed potatoes.  AVOID GAS PRODUCING FOODS. Things to avoid include carbonated beverages (fizzy beverages), raw fruits and raw vegetables, or beans.   If your bowels are filled with gas, your surgeon will have difficulty visualizing your pelvic organs which increases your surgical risks.  Your role in recovery Your role is to become active as soon as directed by your doctor, while still giving yourself time to heal.  Rest  when you feel tired. You will be asked to do the following in order to speed your recovery:  - Cough and breathe deeply. This helps to clear and expand your lungs and can prevent pneumonia after surgery.  - [place]. Do mild physical activity. Walking or moving your legs help your circulation and body functions return to normal. Do not try to get up or walk alone the first time after surgery.   -If you develop swelling on one leg or the other, pain in the back of your leg, redness/warmth in one of your legs, please call the office or go to the Emergency Room to have a doppler to rule out a blood clot. For shortness of breath, chest pain-seek care in the Emergency Room as soon as possible. - Actively manage your pain. Managing your pain lets you move in comfort. We will ask you to rate your pain on a scale of zero to 10. It is your responsibility to tell your doctor or nurse where and how much you hurt so your pain can be treated.  Special Considerations -If you are diabetic, you may be placed on insulin after surgery to have closer control over your blood sugars to promote healing and recovery.  This does not mean that you will be discharged on insulin.  If applicable, your oral antidiabetics will be resumed when you are tolerating a solid diet.  -Your final pathology results from surgery should be available around one week after surgery and the results will be relayed to you  when available.  -Dr. Lahoma Crocker is the surgeon that assists your GYN Oncologist with surgery.  If you end up staying the night, the next day after your surgery you will either see Dr. Denman George, Dr. Berline Lopes, or Dr. Lahoma Crocker.  -FMLA forms can be faxed to [phone] and please allow 5-7 business days for completion.  Pain Management After Surgery -You have been prescribed your pain medication and bowel regimen medications before surgery so that you can have these available when you are  discharged from the hospital. The pain medication is for use ONLY AFTER surgery and a new prescription will not be given.   -Make sure that you have Tylenol and Ibuprofen at home to use on a regular basis after surgery for pain control. We recommend alternating the medications every hour to six hours since they work differently and are processed in the body differently for pain relief.  -Review the attached handout on narcotic use and their risks and side effects.   Bowel Regimen -You have been prescribed Sennakot-S to take nightly to prevent constipation especially if you are taking the narcotic pain medication intermittently.  It is important to prevent constipation and drink adequate amounts of liquids. You can stop taking this medication when you are not taking pain medication and you are back on your normal bowel routine.  Risks of Surgery Risks of surgery are low but include bleeding, infection, damage to surrounding structures, re-operation, blood clots, and very rarely death.   Blood Transfusion Information (For the consent to be signed before surgery)  We will be checking your blood type before surgery so in case of emergencies, we will know what type of blood you would need.                                            WHAT IS A BLOOD TRANSFUSION?  A transfusion is the replacement of blood or some of its parts. Blood is made up of multiple cells which provide different functions.  Red blood cells carry oxygen and are used for blood loss replacement.  White blood cells fight against infection.  Platelets control bleeding.  Plasma helps clot blood.  Other blood products are available for specialized needs, such as hemophilia or other clotting disorders. BEFORE THE TRANSFUSION  Who gives blood for transfusions?   You may be able to donate blood to be used at a later date on yourself (autologous donation).  Relatives can be asked to donate blood. This is generally not any safer  than if you have received blood from a stranger. The same precautions are taken to ensure safety when a relative's blood is donated.  Healthy volunteers who are fully evaluated to make sure their blood is safe. This is blood bank blood. Transfusion therapy is the safest it has ever been in the practice of medicine. Before blood is taken from a donor, a complete history is taken to make sure that person has no history of diseases nor engages in risky social behavior (examples are intravenous drug use or sexual activity with multiple partners). The donor's travel history is screened to minimize risk of transmitting infections, such as malaria. The donated blood is tested for signs of infectious diseases, such as HIV and hepatitis. The blood is then tested to be sure it is compatible with you in order to minimize the chance of a transfusion reaction.  If you or a relative donates blood, this is often done in anticipation of surgery and is not appropriate for emergency situations. It takes many days to process the donated blood. RISKS AND COMPLICATIONS Although transfusion therapy is very safe and saves many lives, the main dangers of transfusion include:   Getting an infectious disease.  Developing a transfusion reaction. This is an allergic reaction to something in the blood you were given. Every precaution is taken to prevent this. The decision to have a blood transfusion has been considered carefully by your caregiver before blood is given. Blood is not given unless the benefits outweigh the risks.  AFTER SURGERY INSTRUCTIONS  Return to work: 4-6 weeks if applicable  Activity: 1. Be up and out of the bed during the day.  Take a nap if needed.  You may walk up steps but be careful and use the hand rail.  Stair climbing will tire you more than you think, you may need to stop part way and rest.   2. No lifting or straining for 6 weeks over 10 pounds. No pushing, pulling, straining for 6 weeks.  3.  No driving for 1 week(s).  Do not drive if you are taking narcotic pain medicine and make sure that your reaction time has returned.   4. You can shower as soon as the next day after surgery. Shower daily.  Use your regular soap and water (not directly on the incision) and pat your incision(s) dry afterwards; don't rub.  No tub baths or submerging your body in water until cleared by your surgeon. If you have the soap that was given to you by pre-surgical testing that was used before surgery, you do not need to use it afterwards because this can irritate your incisions.   5. No sexual activity and nothing in the vagina for 8 weeks.  6. You may experience a small amount of clear drainage from your incisions, which is normal.  If the drainage persists, increases, or changes color please call the office.  7. Do not use creams, lotions, or ointments such as neosporin on your incisions after surgery until advised by your surgeon because they can cause removal of the dermabond glue on your incisions.    8. You may experience vaginal spotting after surgery or around the 6-8 week mark from surgery when the stitches at the top of the vagina begin to dissolve.  The spotting is normal but if you experience heavy bleeding, call our office.  9. Take Tylenol or ibuprofen first for pain and only use narcotic pain medication for severe pain not relieved by the Tylenol or Ibuprofen.  Monitor your Tylenol intake to a max of 4,000 mg in a 24 hour period. You can alternate these medications after surgery.  Diet: 1. Low sodium Heart Healthy Diet is recommended but you are cleared to resume your normal (before surgery) diet after your procedure.  2. It is safe to use a laxative, such as Miralax or Colace, if you have difficulty moving your bowels. You have been prescribed Sennakot at bedtime every evening to keep bowel movements regular and to prevent constipation.    Wound Care: 1. Keep clean and dry.  Shower  daily.  Reasons to call the Doctor:  Fever - Oral temperature greater than 100.4 degrees Fahrenheit  Foul-smelling vaginal discharge  Difficulty urinating  Nausea and vomiting  Increased pain at the site of the incision that is unrelieved with pain medicine.  Difficulty breathing with or without  chest pain  New calf pain especially if only on one side  Sudden, continuing increased vaginal bleeding with or without clots.   Contacts: For questions or concerns you should contact:  Dr. Jeral Pinch at [phone]  Joylene John, NP at [phone]  After Hours: call [phone] and have the GYN Oncologist paged/contacted

## 2019-12-06 NOTE — Patient Instructions (Addendum)
YOU ARE SCHEDULED FOR A COVID TEST __10/7_______@__11 :50_________. THIS TEST MUST BE DONE BEFORE SURGERY. GO TO  [place]. [place], [place], IT IS APPROXIMATELY 2 MINUTES PAST [employer] ON THE RIGHT AND REMAIN IN YOUR CAR, THIS IS A DRIVE UP TEST. ONCE YOUR COVID TEST IS DONE PLEASE FOLLOW ALL THE QUARANTINE  INSTRUCTIONS GIVEN IN YOUR HANDOUT.      Your procedure is scheduled on  12/13/19   Report to [facility].   Call this number if you have problems the morning of surgery  :[phone].   OUR ADDRESS IS [address].  WE ARE LOCATED IN [facility].  PLEASE BRING YOUR INSURANCE CARD AND PHOTO ID DAY OF SURGERY.  ONLY ONE PERSON ALLOWED IN FACILITY WAITING AREA.                                     REMEMBER:  DO NOT EAT FOOD OR DRINK LIQUIDS AFTER MIDNIGHT .    YOU MAY  BRUSH YOUR TEETH MORNING OF SURGERY AND RINSE YOUR MOUTH OUT, NO CHEWING GUM CANDY OR MINTS.   TAKE THESE MEDICATIONS MORNING OF SURGERY WITH A SIP OF WATER:  _Levothyroxine_  How to Manage Your Diabetes Before and After Surgery  Why is it important to control my blood sugar before and after surgery? . Improving blood sugar levels before and after surgery helps healing and can limit problems. . A way of improving blood sugar control is eating a healthy diet by: o  Eating less sugar and carbohydrates o  Increasing activity/exercise o  Talking with your doctor about reaching your blood sugar goals . High blood sugars (greater than 180 mg/dL) can raise your risk of infections and slow your recovery, so you will need to focus on controlling your diabetes during the weeks before surgery. . Make sure that the doctor who takes care of your diabetes knows about your planned surgery including the date and location.  How do I manage my blood sugar before surgery? . Check your blood sugar at least 4 times a day, starting 2 days before surgery, to  make sure that the level is not too high or low. o Check your blood sugar the morning of your surgery when you wake up and every 2 hours until you get to the Short Stay unit. . If your blood sugar is less than 70 mg/dL, you will need to treat for low blood sugar: o Do not take insulin. o Treat a low blood sugar (less than 70 mg/dL) with  cup of clear juice (cranberry or apple), 4 glucose tablets, OR glucose gel. o Recheck blood sugar in 15 minutes after treatment (to make sure it is greater than 70 mg/dL). If your blood sugar is not greater than 70 mg/dL on recheck, call [phone] for further instructions. . Report your blood sugar to the short stay nurse when you get to Short Stay.  . If you are admitted to the hospital after surgery: o Your blood sugar will be checked by the staff and you will probably be given insulin after surgery (instead of oral diabetes medicines) to make sure you have good blood sugar levels. o The goal for blood sugar control after surgery is 80-180 mg/dL.   WHAT DO I DO ABOUT MY DIABETES MEDICATION?  Marland Kitchen Do not take oral diabetes  medicines (pills) the morning of surgery.  . THE NIGHT BEFORE SURGERY, NA      . THE MORNING OF SURGERY, take( 80 % of usual dose )  8  units of Lantus     insulin.  . The day of surgery, do not take other diabetes injectables, including Byetta (exenatide), Bydureon (exenatide ER), Victoza (liraglutide), or Trulicity (dulaglutide).  . If your CBG is greater than 220 mg/dL, you may take  of your sliding scale  . (correction) dose of insulin.      ONE VISITOR IS ALLOWED IN WAITING ROOM ONLY DAY OF SURGERY.  NO VISITOR MAY SPEND THE NIGHT.    VISITOR ARE ALLOWED TO STAY UNTIL 8:00 PM.                                    DO NOT WEAR JEWERLY, MAKE UP, OR NAIL POLISH ON FINGERNAILS. DO NOT WEAR LOTIONS, POWDERS, PERFUMES OR DEODORANT. DO NOT SHAVE FOR 24 HOURS PRIOR TO DAY OF SURGERY. MEN MAY SHAVE FACE AND NECK. CONTACTS, GLASSES,  OR DENTURES MAY NOT BE WORN TO SURGERY.                                    [facility] IS NOT RESPONSIBLE  FOR ANY BELONGINGS.                                                                    Marland Kitchen       [facility] - Preparing for Surgery Before surgery, you can play an important role.  Because skin is not sterile, your skin needs to be as free of germs as possible.  You can reduce the number of germs on your skin by washing with CHG (chlorahexidine gluconate) soap before surgery.  CHG is an antiseptic cleaner which kills germs and bonds with the skin to continue killing germs even after washing. Please DO NOT use if you have an allergy to CHG or antibacterial soaps.  If your skin becomes reddened/irritated stop using the CHG and inform your nurse when you arrive at Short Stay. Do not shave (including legs and underarms) for at least 48 hours prior to the first CHG shower.   Please follow these instructions carefully:  1.  Shower with CHG Soap the night before surgery and the  morning of Surgery.  2.  If you choose to wash your hair, wash your hair first as usual with your  normal  shampoo.  3.  After you shampoo, rinse your hair and body thoroughly to remove the  shampoo.                                        4.  Use CHG as you would any other liquid soap.  You can apply chg directly  to the skin and wash                       Gently with a scrungie or clean washcloth.  5.  Apply the CHG Soap to your body ONLY FROM THE NECK DOWN.   Do not use on face/ open                           Wound or open sores. Avoid contact with eyes, ears mouth and genitals (private parts).                       Wash face,  Genitals (private parts) with your normal soap.             6.  Wash thoroughly, paying special attention to the area where your surgery  will be performed.  7.  Thoroughly rinse your body with warm water from the neck down.  8.  DO NOT shower/wash with your normal soap after using and rinsing off   the CHG Soap.             9.  Pat yourself dry with a clean towel.            10.  Wear clean pajamas.            11.  Place clean sheets on your bed the night of your first shower and do not  sleep with pets. Day of Surgery : Do not apply any lotions/deodorants the morning of surgery.  Please wear clean clothes to the hospital/surgery center.  FAILURE TO FOLLOW THESE INSTRUCTIONS MAY RESULT IN THE CANCELLATION OF YOUR SURGERY PATIENT SIGNATURE_________________________________  NURSE SIGNATURE__________________________________  ________________________________________________________________________

## 2019-12-07 ENCOUNTER — Other Ambulatory Visit: Payer: Self-pay

## 2019-12-07 ENCOUNTER — Encounter (HOSPITAL_COMMUNITY): Payer: Self-pay

## 2019-12-07 ENCOUNTER — Encounter (HOSPITAL_COMMUNITY)
Admission: RE | Admit: 2019-12-07 | Discharge: 2019-12-07 | Disposition: A | Discharge status: Home / Self Care | Payer: 59 | Source: Ambulatory Visit | Attending: Gynecologic Oncology | Admitting: Gynecologic Oncology

## 2019-12-07 ENCOUNTER — Telehealth: Payer: Self-pay

## 2019-12-07 DIAGNOSIS — Z01812 Encounter for preprocedural laboratory examination: Secondary | ICD-10-CM | POA: Insufficient documentation

## 2019-12-07 HISTORY — DX: Hypothyroidism, unspecified: E03.9

## 2019-12-07 LAB — CBC
HCT: 46.8 % — ABNORMAL HIGH (ref 36.0–46.0)
Hemoglobin: 14.7 g/dL (ref 12.0–15.0)
MCH: 28.1 pg (ref 26.0–34.0)
MCHC: 31.4 g/dL (ref 30.0–36.0)
MCV: 89.3 fL (ref 80.0–100.0)
Platelets: 279 10*3/uL (ref 150–400)
RBC: 5.24 MIL/uL — ABNORMAL HIGH (ref 3.87–5.11)
RDW: 13.2 % (ref 11.5–15.5)
WBC: 5.7 10*3/uL (ref 4.0–10.5)
nRBC: 0 % (ref 0.0–0.2)

## 2019-12-07 LAB — COMPREHENSIVE METABOLIC PANEL
ALT: 82 U/L — ABNORMAL HIGH (ref 0–44)
AST: 47 U/L — ABNORMAL HIGH (ref 15–41)
Albumin: 3.9 g/dL (ref 3.5–5.0)
Alkaline Phosphatase: 71 U/L (ref 38–126)
Anion gap: 8 (ref 5–15)
BUN: 22 mg/dL (ref 8–23)
CO2: 28 mmol/L (ref 22–32)
Calcium: 9.7 mg/dL (ref 8.9–10.3)
Chloride: 104 mmol/L (ref 98–111)
Creatinine, Ser: 0.78 mg/dL (ref 0.44–1.00)
GFR calc non Af Amer: >60 mL/min (ref >60)
Glucose, Bld: 112 mg/dL — ABNORMAL HIGH (ref 70–99)
Potassium: 4.3 mmol/L (ref 3.5–5.1)
Sodium: 140 mmol/L (ref 135–145)
Total Bilirubin: 0.5 mg/dL (ref 0.3–1.2)
Total Protein: 6.9 g/dL (ref 6.5–8.1)

## 2019-12-07 LAB — URINALYSIS, ROUTINE W REFLEX MICROSCOPIC
Bilirubin Urine: NEGATIVE
Glucose, UA: NEGATIVE mg/dL
Hgb urine dipstick: NEGATIVE
Ketones, ur: NEGATIVE mg/dL
Leukocytes,Ua: NEGATIVE
Nitrite: NEGATIVE
Protein, ur: NEGATIVE mg/dL
Specific Gravity, Urine: 1.019 (ref 1.005–1.030)
pH: 6 (ref 5.0–8.0)

## 2019-12-07 LAB — GLUCOSE, CAPILLARY: Glucose-Capillary: 108 mg/dL — ABNORMAL HIGH (ref 70–99)

## 2019-12-07 NOTE — Progress Notes (Signed)
COVID Vaccine Completed:Yes Date COVID Vaccine completed:06/07/19 COVID vaccine manufacturer: Pfizer     PCP - Dr. Essie Christine Cardiologist - no  Chest x-ray - no EKG - 05/21/19 Stress Test - no ECHO - no Cardiac Cath - no Pacemaker/ICD device last checked:NA  Sleep Study - no CPAP -   Fasting Blood Sugar - 98-130 Checks Blood Sugar _____ times a day continuous  Blood Thinner Instructions:ASA/ [place] Aspirin Instructions:Stop one day prior/ Berline Lopes Last Dose:10/10  Anesthesia review:   Patient denies shortness of breath, fever, cough and chest pain at PAT appointment yes   Patient verbalized understanding of instructions that were given to them at the PAT appointment. Patient was also instructed that they will need to review over the PAT instructions again at home before surgery. Yes Pt works out and is in good shape . No SOB climbing stairs, doing housework or with ADLs

## 2019-12-07 NOTE — Telephone Encounter (Signed)
TC to patient regarding lab results/elevated liver enzymes.  Patient denied excess or much use of tylenol and only drinks wine occasionally.  Patient does report an increase in lipitor from 40mg  to 80mg  2-3 weeks ago.  Lab results faxed to PCP.

## 2019-12-08 ENCOUNTER — Telehealth: Payer: Self-pay | Admitting: Gynecologic Oncology

## 2019-12-08 NOTE — Telephone Encounter (Signed)
Called patient and informed her that Dr. Berline Lopes has broken her leg and would not be able to perform her surgery on Monday, Oct 11 due to this. Advised her that Dr. Charisse March partner, Dr. Everitt Amber, is available to perform the surgery for her if she felt comfortable moving forward.  She states she is agreeable with proceeding with surgery with Dr. Everitt Amber. I advised her to please call for any questions or concerns.

## 2019-12-09 ENCOUNTER — Encounter (HOSPITAL_COMMUNITY): Admission: RE | Admit: 2019-12-09 | Payer: 59 | Source: Ambulatory Visit

## 2019-12-09 ENCOUNTER — Other Ambulatory Visit (HOSPITAL_COMMUNITY)
Admission: RE | Admit: 2019-12-09 | Discharge: 2019-12-09 | Disposition: A | Discharge status: Home / Self Care | Payer: 59 | Source: Ambulatory Visit | Attending: Gynecologic Oncology | Admitting: Gynecologic Oncology

## 2019-12-09 DIAGNOSIS — Z20822 Contact with and (suspected) exposure to covid-19: Secondary | ICD-10-CM | POA: Diagnosis not present

## 2019-12-09 DIAGNOSIS — Z01812 Encounter for preprocedural laboratory examination: Secondary | ICD-10-CM | POA: Diagnosis not present

## 2019-12-09 LAB — SARS CORONAVIRUS 2 (TAT 6-24 HRS): SARS Coronavirus 2: NEGATIVE

## 2019-12-10 ENCOUNTER — Telehealth: Payer: Self-pay

## 2019-12-10 NOTE — Telephone Encounter (Signed)
Mary Frank states that she understands her written pre op instructions and does not have any questions at this time.

## 2019-12-13 ENCOUNTER — Ambulatory Visit (HOSPITAL_BASED_OUTPATIENT_CLINIC_OR_DEPARTMENT_OTHER): Payer: 59 | Admitting: Anesthesiology

## 2019-12-13 ENCOUNTER — Encounter (HOSPITAL_BASED_OUTPATIENT_CLINIC_OR_DEPARTMENT_OTHER)
Admission: RE | Disposition: A | Discharge status: Home / Self Care | Payer: Self-pay | Source: Home / Self Care | Attending: Gynecologic Oncology

## 2019-12-13 ENCOUNTER — Encounter (HOSPITAL_BASED_OUTPATIENT_CLINIC_OR_DEPARTMENT_OTHER): Payer: Self-pay | Admitting: Gynecologic Oncology

## 2019-12-13 ENCOUNTER — Other Ambulatory Visit: Payer: Self-pay

## 2019-12-13 ENCOUNTER — Ambulatory Visit (HOSPITAL_BASED_OUTPATIENT_CLINIC_OR_DEPARTMENT_OTHER)
Admission: RE | Admit: 2019-12-13 | Discharge: 2019-12-13 | Disposition: A | Discharge status: Home / Self Care | Payer: 59 | Attending: Gynecologic Oncology | Admitting: Gynecologic Oncology

## 2019-12-13 DIAGNOSIS — E039 Hypothyroidism, unspecified: Secondary | ICD-10-CM | POA: Diagnosis not present

## 2019-12-13 DIAGNOSIS — I10 Essential (primary) hypertension: Secondary | ICD-10-CM | POA: Diagnosis not present

## 2019-12-13 DIAGNOSIS — C541 Malignant neoplasm of endometrium: Secondary | ICD-10-CM | POA: Diagnosis present

## 2019-12-13 DIAGNOSIS — E785 Hyperlipidemia, unspecified: Secondary | ICD-10-CM | POA: Insufficient documentation

## 2019-12-13 DIAGNOSIS — Z87891 Personal history of nicotine dependence: Secondary | ICD-10-CM | POA: Insufficient documentation

## 2019-12-13 DIAGNOSIS — Z79890 Hormone replacement therapy (postmenopausal): Secondary | ICD-10-CM | POA: Diagnosis not present

## 2019-12-13 DIAGNOSIS — Z79899 Other long term (current) drug therapy: Secondary | ICD-10-CM | POA: Insufficient documentation

## 2019-12-13 DIAGNOSIS — Z794 Long term (current) use of insulin: Secondary | ICD-10-CM | POA: Insufficient documentation

## 2019-12-13 DIAGNOSIS — E109 Type 1 diabetes mellitus without complications: Secondary | ICD-10-CM | POA: Insufficient documentation

## 2019-12-13 DIAGNOSIS — Z7982 Long term (current) use of aspirin: Secondary | ICD-10-CM | POA: Diagnosis not present

## 2019-12-13 HISTORY — PX: SENTINEL NODE BIOPSY: SHX6608

## 2019-12-13 HISTORY — PX: ROBOTIC ASSISTED TOTAL HYSTERECTOMY WITH BILATERAL SALPINGO OOPHERECTOMY: SHX6086

## 2019-12-13 LAB — TYPE AND SCREEN
ABO/RH(D): O POS
Antibody Screen: NEGATIVE

## 2019-12-13 LAB — GLUCOSE, CAPILLARY
Glucose-Capillary: 143 mg/dL — ABNORMAL HIGH (ref 70–99)
Glucose-Capillary: 144 mg/dL — ABNORMAL HIGH (ref 70–99)

## 2019-12-13 LAB — ABO/RH: ABO/RH(D): O POS

## 2019-12-13 SURGERY — HYSTERECTOMY, TOTAL, ROBOT-ASSISTED, LAPAROSCOPIC, WITH BILATERAL SALPINGO-OOPHORECTOMY
Anesthesia: General | Site: Abdomen

## 2019-12-13 MED ORDER — SUGAMMADEX SODIUM 200 MG/2ML IV SOLN
INTRAVENOUS | Status: DC | PRN
  Administered 2019-12-13: 200 mg via INTRAVENOUS

## 2019-12-13 MED ORDER — HYDROMORPHONE HCL 1 MG/ML IJ SOLN
INTRAMUSCULAR | Status: DC | PRN
Start: 2019-12-13 — End: 2019-12-13
  Administered 2019-12-13: .5 mg via INTRAVENOUS

## 2019-12-13 MED ORDER — GLYCOPYRROLATE 0.2 MG/ML IJ SOLN
INTRAMUSCULAR | Status: DC | PRN
  Administered 2019-12-13: .2 mg via INTRAVENOUS

## 2019-12-13 MED ORDER — TRAMADOL HCL 50 MG PO TABS: 50.0000 mg | ORAL_TABLET | Freq: Four times a day (QID) | ORAL | Status: DC | PRN

## 2019-12-13 MED ORDER — PROPOFOL 10 MG/ML IV BOLUS
INTRAVENOUS | Status: DC | PRN
  Administered 2019-12-13: 150 mg via INTRAVENOUS

## 2019-12-13 MED ORDER — HEPARIN SODIUM (PORCINE) 5000 UNIT/ML IJ SOLN: 5000.0000 [IU] | INTRAMUSCULAR | Status: DC

## 2019-12-13 MED ORDER — LIDOCAINE 2% (20 MG/ML) 5 ML SYRINGE
INTRAMUSCULAR | Status: AC
  Filled 2019-12-13: qty 5

## 2019-12-13 MED ORDER — HYDROMORPHONE HCL 1 MG/ML IJ SOLN
0.2000 mg | INTRAMUSCULAR | Status: DC | PRN
  Administered 2019-12-13: 0.5 mg via INTRAVENOUS

## 2019-12-13 MED ORDER — ONDANSETRON HCL 4 MG/2ML IJ SOLN: 4.0000 mg | Freq: Four times a day (QID) | INTRAMUSCULAR | Status: DC | PRN

## 2019-12-13 MED ORDER — HEPARIN SODIUM (PORCINE) 5000 UNIT/ML IJ SOLN
INTRAMUSCULAR | Status: AC
  Filled 2019-12-13: qty 1

## 2019-12-13 MED ORDER — DEXAMETHASONE SODIUM PHOSPHATE 10 MG/ML IJ SOLN
INTRAMUSCULAR | Status: DC | PRN
  Administered 2019-12-13: 4 mg via INTRAVENOUS

## 2019-12-13 MED ORDER — LACTATED RINGERS IV SOLN: INTRAVENOUS | Status: DC

## 2019-12-13 MED ORDER — FENTANYL CITRATE (PF) 100 MCG/2ML IJ SOLN
INTRAMUSCULAR | Status: DC | PRN
  Administered 2019-12-13 (×2): 25 ug via INTRAVENOUS
  Administered 2019-12-13 (×4): 50 ug via INTRAVENOUS

## 2019-12-13 MED ORDER — CEFAZOLIN SODIUM-DEXTROSE 2-4 GM/100ML-% IV SOLN
2.0000 g | INTRAVENOUS | Status: AC
  Administered 2019-12-13: 2 g via INTRAVENOUS

## 2019-12-13 MED ORDER — SODIUM CHLORIDE 0.9 % IR SOLN
Status: DC | PRN
  Administered 2019-12-13: 3000 mL

## 2019-12-13 MED ORDER — ONDANSETRON HCL 4 MG PO TABS: 4.0000 mg | ORAL_TABLET | Freq: Four times a day (QID) | ORAL | Status: DC | PRN

## 2019-12-13 MED ORDER — LIDOCAINE HCL (CARDIAC) PF 100 MG/5ML IV SOSY
PREFILLED_SYRINGE | INTRAVENOUS | Status: DC | PRN
  Administered 2019-12-13: 80 mg via INTRAVENOUS
  Administered 2019-12-13: 50 mg via INTRAVENOUS

## 2019-12-13 MED ORDER — KETOROLAC TROMETHAMINE 30 MG/ML IJ SOLN
INTRAMUSCULAR | Status: DC | PRN
  Administered 2019-12-13: 30 mg via INTRAVENOUS

## 2019-12-13 MED ORDER — ONDANSETRON HCL 4 MG/2ML IJ SOLN: 4.0000 mg | Freq: Once | INTRAMUSCULAR | Status: DC | PRN

## 2019-12-13 MED ORDER — ACETAMINOPHEN 325 MG PO TABS
ORAL_TABLET | ORAL | Status: AC
  Filled 2019-12-13: qty 2

## 2019-12-13 MED ORDER — PROPOFOL 10 MG/ML IV BOLUS
INTRAVENOUS | Status: AC
  Filled 2019-12-13: qty 20

## 2019-12-13 MED ORDER — KETAMINE HCL 10 MG/ML IJ SOLN
INTRAMUSCULAR | Status: AC
  Filled 2019-12-13: qty 1

## 2019-12-13 MED ORDER — HYDROMORPHONE HCL 2 MG/ML IJ SOLN
INTRAMUSCULAR | Status: AC
  Filled 2019-12-13: qty 1

## 2019-12-13 MED ORDER — ACETAMINOPHEN 325 MG PO TABS
325.0000 mg | ORAL_TABLET | ORAL | Status: DC | PRN
  Administered 2019-12-13: 650 mg via ORAL

## 2019-12-13 MED ORDER — DEXAMETHASONE SODIUM PHOSPHATE 10 MG/ML IJ SOLN
INTRAMUSCULAR | Status: AC
  Filled 2019-12-13: qty 1

## 2019-12-13 MED ORDER — BUPIVACAINE HCL 0.25 % IJ SOLN
INTRAMUSCULAR | Status: DC | PRN
  Administered 2019-12-13: 16 mL
  Administered 2019-12-13: 14 mL

## 2019-12-13 MED ORDER — OXYCODONE HCL 5 MG PO TABS
5.0000 mg | ORAL_TABLET | Freq: Once | ORAL | Status: AC | PRN
  Administered 2019-12-13: 5 mg via ORAL

## 2019-12-13 MED ORDER — GABAPENTIN 100 MG PO CAPS
ORAL_CAPSULE | ORAL | Status: AC
  Filled 2019-12-13: qty 1

## 2019-12-13 MED ORDER — ROCURONIUM BROMIDE 10 MG/ML (PF) SYRINGE
PREFILLED_SYRINGE | INTRAVENOUS | Status: AC
  Filled 2019-12-13: qty 10

## 2019-12-13 MED ORDER — FENTANYL CITRATE (PF) 250 MCG/5ML IJ SOLN
INTRAMUSCULAR | Status: AC
  Filled 2019-12-13: qty 5

## 2019-12-13 MED ORDER — ACETAMINOPHEN 500 MG PO TABS
1000.0000 mg | ORAL_TABLET | ORAL | Status: AC
  Administered 2019-12-13: 1000 mg via ORAL

## 2019-12-13 MED ORDER — SCOPOLAMINE 1 MG/3DAYS TD PT72
1.0000 | MEDICATED_PATCH | TRANSDERMAL | Status: DC
  Administered 2019-12-13: 1.5 mg via TRANSDERMAL

## 2019-12-13 MED ORDER — ROCURONIUM BROMIDE 100 MG/10ML IV SOLN
INTRAVENOUS | Status: DC | PRN
  Administered 2019-12-13: 20 mg via INTRAVENOUS
  Administered 2019-12-13: 70 mg via INTRAVENOUS
  Administered 2019-12-13: 10 mg via INTRAVENOUS

## 2019-12-13 MED ORDER — MIDAZOLAM HCL 2 MG/2ML IJ SOLN
INTRAMUSCULAR | Status: AC
  Filled 2019-12-13: qty 2

## 2019-12-13 MED ORDER — OXYCODONE HCL 5 MG PO TABS
ORAL_TABLET | ORAL | Status: AC
  Filled 2019-12-13: qty 1

## 2019-12-13 MED ORDER — HYDROMORPHONE HCL 1 MG/ML IJ SOLN
INTRAMUSCULAR | Status: AC
  Filled 2019-12-13: qty 1

## 2019-12-13 MED ORDER — GABAPENTIN 100 MG PO CAPS
100.0000 mg | ORAL_CAPSULE | ORAL | Status: AC
  Administered 2019-12-13: 100 mg via ORAL

## 2019-12-13 MED ORDER — MEPERIDINE HCL 25 MG/ML IJ SOLN: 6.2500 mg | INTRAMUSCULAR | Status: DC | PRN

## 2019-12-13 MED ORDER — OXYCODONE HCL 5 MG PO TABS: 5.0000 mg | ORAL_TABLET | ORAL | Status: DC | PRN

## 2019-12-13 MED ORDER — FENTANYL CITRATE (PF) 100 MCG/2ML IJ SOLN: 25.0000 ug | INTRAMUSCULAR | Status: DC | PRN

## 2019-12-13 MED ORDER — MIDAZOLAM HCL 5 MG/5ML IJ SOLN
INTRAMUSCULAR | Status: DC | PRN
  Administered 2019-12-13 (×2): 1 mg via INTRAVENOUS

## 2019-12-13 MED ORDER — SCOPOLAMINE 1 MG/3DAYS TD PT72
MEDICATED_PATCH | TRANSDERMAL | Status: AC
  Filled 2019-12-13: qty 1

## 2019-12-13 MED ORDER — ACETAMINOPHEN 160 MG/5ML PO SOLN: 325.0000 mg | ORAL | Status: DC | PRN

## 2019-12-13 MED ORDER — CEFAZOLIN SODIUM-DEXTROSE 2-4 GM/100ML-% IV SOLN
INTRAVENOUS | Status: AC
  Filled 2019-12-13: qty 100

## 2019-12-13 MED ORDER — ONDANSETRON HCL 4 MG/2ML IJ SOLN
INTRAMUSCULAR | Status: DC | PRN
  Administered 2019-12-13: 4 mg via INTRAVENOUS

## 2019-12-13 MED ORDER — ONDANSETRON HCL 4 MG/2ML IJ SOLN
INTRAMUSCULAR | Status: AC
  Filled 2019-12-13: qty 2

## 2019-12-13 MED ORDER — KETAMINE HCL 10 MG/ML IJ SOLN
INTRAMUSCULAR | Status: DC | PRN
  Administered 2019-12-13: 30 mg via INTRAVENOUS

## 2019-12-13 MED ORDER — ACETAMINOPHEN 500 MG PO TABS
ORAL_TABLET | ORAL | Status: AC
  Filled 2019-12-13: qty 2

## 2019-12-13 MED ORDER — KETOROLAC TROMETHAMINE 15 MG/ML IJ SOLN: 15.0000 mg | Freq: Once | INTRAMUSCULAR | Status: DC

## 2019-12-13 MED ORDER — STERILE WATER FOR INJECTION IJ SOLN
INTRAMUSCULAR | Status: DC | PRN
  Administered 2019-12-13: 10 mL

## 2019-12-13 MED ORDER — ASPIRIN EC 81 MG PO TBEC: 81.0000 mg | DELAYED_RELEASE_TABLET | Freq: Every day | ORAL | 11 refills | Status: AC

## 2019-12-13 MED ORDER — OXYCODONE HCL 5 MG/5ML PO SOLN: 5.0000 mg | Freq: Once | ORAL | Status: AC | PRN

## 2019-12-13 MED ORDER — BUPIVACAINE LIPOSOME 1.3 % IJ SUSP
INTRAMUSCULAR | Status: DC | PRN
  Administered 2019-12-13: 20 mL

## 2019-12-13 SURGICAL SUPPLY — 62 items
ADH SKN CLS APL DERMABOND .7 (GAUZE/BANDAGES/DRESSINGS) ×4
BAG LAPAROSCOPIC 12 15 PORT 16 (BASKET) ×2 IMPLANT
BAG RETRIEVAL 12/15 (BASKET) ×3
BLADE SURG 10 STRL SS (BLADE) ×3 IMPLANT
COVER BACK TABLE 60X90IN (DRAPES) ×3 IMPLANT
COVER TIP SHEARS 8 DVNC (MISCELLANEOUS) ×2 IMPLANT
COVER TIP SHEARS 8MM DA VINCI (MISCELLANEOUS) ×3
COVER WAND RF STERILE (DRAPES) ×3 IMPLANT
DECANTER SPIKE VIAL GLASS SM (MISCELLANEOUS) ×2 IMPLANT
DERMABOND ADVANCED (GAUZE/BANDAGES/DRESSINGS) ×2
DERMABOND ADVANCED .7 DNX12 (GAUZE/BANDAGES/DRESSINGS) ×3 IMPLANT
DRAPE ARM DVNC X/XI (DISPOSABLE) ×8 IMPLANT
DRAPE COLUMN DVNC XI (DISPOSABLE) ×2 IMPLANT
DRAPE DA VINCI XI ARM (DISPOSABLE) ×12
DRAPE DA VINCI XI COLUMN (DISPOSABLE) ×3
DRAPE SHEET LG 3/4 BI-LAMINATE (DRAPES) ×3 IMPLANT
DRAPE SURG IRRIG POUCH 19X23 (DRAPES) ×3 IMPLANT
DRSG OPSITE POSTOP 3X4 (GAUZE/BANDAGES/DRESSINGS) ×4 IMPLANT
ELECT REM PT RETURN 9FT ADLT (ELECTROSURGICAL) ×3
ELECTRODE REM PT RTRN 9FT ADLT (ELECTROSURGICAL) ×2 IMPLANT
GAUZE 4X4 16PLY RFD (DISPOSABLE) ×3 IMPLANT
GLOVE BIO SURGEON STRL SZ 6 (GLOVE) ×12 IMPLANT
GLOVE BIO SURGEON STRL SZ 6.5 (GLOVE) ×6 IMPLANT
GLOVE BIO SURGEON STRL SZ7 (GLOVE) ×8 IMPLANT
GLOVE BIOGEL PI IND STRL 6.5 (GLOVE) ×4 IMPLANT
GLOVE BIOGEL PI IND STRL 7.5 (GLOVE) ×2 IMPLANT
GLOVE BIOGEL PI INDICATOR 6.5 (GLOVE) ×2
GLOVE BIOGEL PI INDICATOR 7.5 (GLOVE) ×2
IRRIG SUCT STRYKERFLOW 2 WTIP (MISCELLANEOUS) ×3
IRRIGATION SUCT STRKRFLW 2 WTP (MISCELLANEOUS) ×2 IMPLANT
KIT PROCEDURE DA VINCI SI (MISCELLANEOUS) ×3
KIT PROCEDURE DVNC SI (MISCELLANEOUS) ×2 IMPLANT
KIT TURNOVER CYSTO (KITS) ×3 IMPLANT
LEGGING LITHOTOMY PAIR STRL (DRAPES) ×3 IMPLANT
MANIFOLD NEPTUNE II (INSTRUMENTS) ×1 IMPLANT
MANIPULATOR UTERINE 4.5 ZUMI (MISCELLANEOUS) ×3 IMPLANT
NDL SPNL 18GX3.5 QUINCKE PK (NEEDLE) IMPLANT
NEEDLE HYPO 22GX1.5 SAFETY (NEEDLE) ×3 IMPLANT
NEEDLE SPNL 18GX3.5 QUINCKE PK (NEEDLE) ×3 IMPLANT
OBTURATOR OPTICAL STANDARD 8MM (TROCAR) ×3
OBTURATOR OPTICAL STND 8 DVNC (TROCAR) ×2
OBTURATOR OPTICALSTD 8 DVNC (TROCAR) ×2 IMPLANT
PACK ROBOT GYN CUSTOM WL (TRAY / TRAY PROCEDURE) ×3 IMPLANT
PACK ROBOTIC GOWN (GOWN DISPOSABLE) ×3 IMPLANT
PAD POSITIONING PINK XL (MISCELLANEOUS) ×3 IMPLANT
PENCIL BUTTON HOLSTER BLD 10FT (ELECTRODE) ×1 IMPLANT
PORT ACCESS TROCAR AIRSEAL 12 (TROCAR) ×2 IMPLANT
PORT ACCESS TROCAR AIRSEAL 5M (TROCAR) ×1
SEAL CANN UNIV 5-8 DVNC XI (MISCELLANEOUS) ×6 IMPLANT
SEAL XI 5MM-8MM UNIVERSAL (MISCELLANEOUS) ×9
SET TRI-LUMEN FLTR TB AIRSEAL (TUBING) ×3 IMPLANT
SUT MNCRL AB 4-0 PS2 18 (SUTURE) ×3 IMPLANT
SUT VIC AB 0 CT1 27 (SUTURE) ×9
SUT VIC AB 0 CT1 27XBRD ANBCTR (SUTURE) ×3 IMPLANT
SUT VIC AB 4-0 PS2 18 (SUTURE) ×12 IMPLANT
SYR 10ML LL (SYRINGE) ×2 IMPLANT
TRAY FOL W/BAG SLVR 16FR STRL (SET/KITS/TRAYS/PACK) ×2 IMPLANT
TRAY FOLEY W/BAG SLVR 16FR LF (SET/KITS/TRAYS/PACK) ×3
TUBE CONNECTING 12X1/4 (SUCTIONS) ×3 IMPLANT
UNDERPAD 30X36 HEAVY ABSORB (UNDERPADS AND DIAPERS) ×3 IMPLANT
WATER STERILE IRR 1000ML POUR (IV SOLUTION) ×3 IMPLANT
YANKAUER SUCT BULB TIP NO VENT (SUCTIONS) ×4 IMPLANT

## 2019-12-13 NOTE — Transfer of Care (Signed)
Immediate Anesthesia Transfer of Care Note  Patient: Mary Frank  Procedure(s) Performed: XI ROBOTIC ASSISTED TOTAL HYSTERECTOMY WITH BILATERAL SALPINGO OOPHORECTOMY (SPECIMEN WEIGHING >250 GRAMS), MINI LAPAROTOMY (Bilateral Abdomen) SENTINEL LYMPH NODE BIOPSY POSSIBLE LYMPH NODE DISSECTION (N/A Abdomen)  Patient Location: PACU  Anesthesia Type:General  Level of Consciousness: awake, alert , oriented and patient cooperative  Airway & Oxygen Therapy: Patient Spontanous Breathing and Patient connected to face mask oxygen  Post-op Assessment: Report given to RN and Post -op Vital signs reviewed and stable  Post vital signs: Reviewed and stable  Last Vitals:  Vitals Value Taken Time  BP 124/76 12/13/19 1031  Temp    Pulse 63 12/13/19 1033  Resp 14 12/13/19 1033  SpO2 99 % 12/13/19 1033  Vitals shown include unvalidated device data.  Last Pain:  Vitals:   12/13/19 0707  TempSrc: Oral  PainSc: 0-No pain      Patients Stated Pain Goal: 4 (88/41/66 0630)  Complications: No complications documented.

## 2019-12-13 NOTE — Interval H&P Note (Signed)
History and Physical Interval Note:  12/13/2019 6:53 AM  Mary Frank  has presented today for surgery, with the diagnosis of ENDOMETRIAL CANCER.  The various methods of treatment have been discussed with the patient and family. After consideration of risks, benefits and other options for treatment, the patient has consented to  Procedure(s): XI ROBOTIC ASSISTED TOTAL HYSTERECTOMY WITH BILATERAL SALPINGO OOPHORECTOMY, POSSIBLE LAPAROTOMY (Bilateral) SENTINEL LYMPH NODE BIOPSY POSSIBLE LYMPH NODE DISSECTION (N/A) as a surgical intervention.  The patient's history has been reviewed, patient examined, no change in status, stable for surgery.  I have reviewed the patient's chart and labs.  Questions were answered to the patient's satisfaction.     Thereasa Solo

## 2019-12-13 NOTE — Op Note (Addendum)
OPERATIVE NOTE 12/13/19  Surgeon: Donaciano Eva   Assistants: Joylene John, NP (a provider  assistant was necessary for tissue manipulation, management of robotic instrumentation, retraction and positioning due to the complexity of the case and hospital policies).   Anesthesia: General endotracheal anesthesia  ASA Class: 3   Pre-operative Diagnosis: endometrial cancer grade 1  Post-operative Diagnosis: same, enlarged uterus with fibroids  Operation: Robotic-assisted laparoscopic total hysterectomy >250gm with bilateral salpingoophorectomy, SLN biopsy, minilaparotomy  Surgeon: Donaciano Eva  Assistant Surgeon: Joylene John NP  Anesthesia: GET  Urine Output: 80cc  Operative Findings:  : enlarged 500+gm uterus, normal tubes and ovaries, no gross extratuerine disease, no apparently suspicious lymph nodes.   Estimated Blood Loss:  25cc      Total IV Fluids: 800 ml         Specimens: uterus, cervix, bilateral tubes and ovaries, right and left external iliac SLN         Complications:  None; patient tolerated the procedure well.         Disposition: PACU - hemodynamically stable.  Procedure Details  The patient was seen in the Holding Room. The risks, benefits, complications, treatment options, and expected outcomes were discussed with the patient.  The patient concurred with the proposed plan, giving informed consent.  The site of surgery properly noted/marked. The patient was identified as Mary Frank and the procedure verified as a Robotic-assisted hysterectomy with bilateral salpingo oophorectomy with SLN biopsy. A Time Out was held and the above information confirmed.  After induction of anesthesia, the patient was draped and prepped in the usual sterile manner. Pt was placed in supine position after anesthesia and draped and prepped in the usual sterile manner. The abdominal drape was placed after the CholoraPrep had been allowed to dry for 3 minutes.  Her arms  were tucked to her side with all appropriate precautions.  The shoulders were stabilized with padded shoulder blocks applied to the acromium processes.  The patient was placed in the semi-lithotomy position in [place].  The perineum was prepped with Betadine. The patient was then prepped. Foley catheter was placed.  A sterile speculum was placed in the vagina.  The cervix was grasped with a single-tooth tenaculum. 2mg  total of ICG was injected into the cervical stroma at 2 and 9 o'clock with 1cc injected at a 1cm and 71mm depth (concentration 0.5mg /ml) in all locations. The cervix was dilated with Kennon Rounds dilators.  The ZUMI uterine manipulator with a medium colpotomizer ring was placed without difficulty.  A pneum occluder balloon was placed over the manipulator.  OG tube placement was confirmed and to suction.   Next, a 5 mm skin incision was made 1 cm below the subcostal margin in the midclavicular line.  The 5 mm Optiview port and scope was used for direct entry.  Opening pressure was under 10 mm CO2.  The abdomen was insufflated and the findings were noted as above.   At this point and all points during the procedure, the patient's intra-abdominal pressure did not exceed 15 mmHg. Next, a 10 mm skin incision was made in the umbilicus and a right and left port was placed about 10 cm lateral to the robot port on the right and left side.   All ports were placed under direct visualization.  The patient was placed in steep Trendelenburg.  Bowel was folded away into the upper abdomen.  The robot was docked in the normal manner.  The right and left peritoneum were  opened parallel to the IP ligament to open the retroperitoneal spaces bilaterally. The SLN mapping was performed in bilateral pelvic basins. The para rectal and paravesical spaces were opened up entirely with careful dissection below the level of the ureters bilaterally and to the depth of the uterine artery origin in order to skeletonize the uterine  "web" and ensure visualization of all parametrial channels. The para-aortic basins were carefully exposed and evaluated for isolated para-aortic SLN's. Lymphatic channels were identified travelling to the following visualized sentinel lymph node's: left and right . These SLN's were separated from their surrounding lymphatic tissue, removed and sent for permanent pathology.  The hysterectomy was started after the round ligament on the right side was incised and the retroperitoneum was entered and the pararectal space was developed.  The ureter was noted to be on the medial leaf of the broad ligament.  The peritoneum above the ureter was incised and stretched and the infundibulopelvic ligament was skeletonized, cauterized and cut.  The posterior peritoneum was taken down to the level of the KOH ring.  The anterior peritoneum was also taken down.  The bladder flap was created to the level of the KOH ring.  The uterine artery on the right side was skeletonized, cauterized and cut in the normal manner.  A similar procedure was performed on the left.  The colpotomy was made and the uterus, cervix, bilateral ovaries and tubes were amputated but could not be delivered through the vagina due to the size of the uterus which was placed in an endocatch bag.  Pedicles were inspected and excellent hemostasis was achieved.    The colpotomy at the vaginal cuff was closed with Vicryl on a CT1 needle in running manner.  Irrigation was used and excellent hemostasis was achieved.  At this point in the procedure was completed.  Robotic instruments were removed under direct visulaization.  The robot was undocked.   A 6cm suprapubic low transverse incision was made with the scalpel. The subcutaneous skin was opened with the bovie. The fascia was opened with the bovie transversely and the rectus muscles were dissected off of the fascia inferiorally and superiorally. The peritoneum was opened sharply in the midline. The peritoneal  incision was extended. The uterine specimen in the endocatch bag was retrieved through the incision. The fascia was closed with running 0-vicryl. The subcutaneous fat was closed with 2-0 vicryl. 20cc of exparel mixed with 20cc of marcaine was infiltrated into the incision. The incision was closed at the skin with monocryl and dermabond.   The 10 mm ports were closed with Vicryl on a UR-5 needle and the fascia was closed with 0 Vicryl on a UR-5 needle.  The skin was closed with 4-0 Vicryl in a subcuticular manner.  Dermabond was applied.  Sponge, lap and needle counts correct x 2.  The patient was taken to the recovery room in stable condition.  The vagina was swabbed with  minimal bleeding noted.   All instrument and needle counts were correct x  3.   The patient was transferred to the recovery room in a stable condition.  Donaciano Eva, MD

## 2019-12-13 NOTE — Anesthesia Procedure Notes (Signed)

## 2019-12-13 NOTE — Anesthesia Postprocedure Evaluation (Signed)
Anesthesia Post Note  Patient: Mary Frank  Procedure(s) Performed: XI ROBOTIC ASSISTED TOTAL HYSTERECTOMY WITH BILATERAL SALPINGO OOPHORECTOMY (SPECIMEN WEIGHING >250 GRAMS), MINI LAPAROTOMY (Bilateral Abdomen) SENTINEL LYMPH NODE BIOPSY (N/A Abdomen)     Patient location during evaluation: PACU Anesthesia Type: General Level of consciousness: awake Pain management: pain level controlled Vital Signs Assessment: post-procedure vital signs reviewed and stable Respiratory status: spontaneous breathing Cardiovascular status: stable Postop Assessment: no apparent nausea or vomiting Anesthetic complications: no   No complications documented.  Last Vitals:  Vitals:   12/13/19 1130 12/13/19 1145  BP: 118/79   Pulse: 60 63  Resp: 14 18  Temp: 36.6 C   SpO2: 98% 95%    Last Pain:  Vitals:   12/13/19 1130  TempSrc:   PainSc: 0-No pain   Pain Goal: Patients Stated Pain Goal: 4 (12/13/19 0707)                 Huston Foley

## 2019-12-13 NOTE — Anesthesia Preprocedure Evaluation (Signed)
Anesthesia Evaluation  Patient identified by MRN, date of birth, ID band Patient awake    Reviewed: Allergy & Precautions, NPO status , Patient's Chart, lab work & pertinent test results  Airway Mallampati: I       Dental no notable dental hx.    Pulmonary former smoker,    Pulmonary exam normal        Cardiovascular hypertension, Pt. on medications Normal cardiovascular exam     Neuro/Psych negative neurological ROS  negative psych ROS   GI/Hepatic negative GI ROS, Neg liver ROS,   Endo/Other  diabetes, Type 2, Insulin DependentHypothyroidism   Renal/GU Renal diseaseCKD II     Musculoskeletal   Abdominal Normal abdominal exam  (+)   Peds  Hematology   Anesthesia Other Findings   Reproductive/Obstetrics                             Anesthesia Physical Anesthesia Plan  ASA: II  Anesthesia Plan: General   Post-op Pain Management:    Induction: Intravenous  PONV Risk Score and Plan: 4 or greater and Ondansetron, Midazolam and Treatment may vary due to age or medical condition  Airway Management Planned: Oral ETT  Additional Equipment: None  Intra-op Plan:   Post-operative Plan: Extubation in OR  Informed Consent: I have reviewed the patients History and Physical, chart, labs and discussed the procedure including the risks, benefits and alternatives for the proposed anesthesia with the patient or authorized representative who has indicated his/her understanding and acceptance.     Dental advisory given  Plan Discussed with: CRNA  Anesthesia Plan Comments:         Anesthesia Quick Evaluation

## 2019-12-13 NOTE — Discharge Instructions (Addendum)
12/13/2019  Return to work: 4 weeks if applicable  You had a mini laparotomy (larger incision on your lower abdomen) in order to remove the uterus since it was too big to remove through the vagina. The white honeycomb dressing can stay in place for 5 days after surgery then you can remove the dressing and you do not need to reapply a new dressing.  Activity: 1. Be up and out of the bed during the day.  Take a nap if needed.  You may walk up steps but be careful and use the hand rail.  Stair climbing will tire you more than you think, you may need to stop part way and rest.   2. No lifting or straining for 6 weeks.  3. No driving for 1 week(s).  Do not drive if you are taking narcotic pain medicine.  4. Shower daily.  Use soap and water on your incision and pat dry; don't rub.  No tub baths until cleared by your surgeon.   5. No sexual activity and nothing in the vagina for 8 weeks.  6. You may experience a small amount of clear drainage from your incisions, which is normal.  If the drainage persists or increases, please call the office.  7. You may experience vaginal spotting after surgery or around the 6-8 week mark from surgery when the stitches at the top of the vagina begin to dissolve.  The spotting is normal but if you experience heavy bleeding, call our office.  8. Take Tylenol or ibuprofen first for pain and only use narcotic pain medication for severe pain not relieved by the Tylenol or Ibuprofen.  Monitor your Tylenol intake to a max of 4,000 mg.  Diet: 1. Low sodium Heart Healthy Diet is recommended. You can resume your diabetic diet you follow at home.  Dressing:  You can get the dressing wet in the shower, but avoid tub baths. Remove the dressing between 5 and 7 days after your surgery (1 week after your operation).  After the dressing is removed you can get the incision wet in the shower, however continue to avoid tub baths until advised otherwise by your surgeon at  follow-up.   2. It is safe to use a laxative, such as Miralax or Colace, if you have difficulty moving your bowels. You can take Sennakot at bedtime every evening to keep bowel movements regular and to prevent constipation.    Wound Care: 1. Keep clean and dry.  Shower daily.  Reasons to call the Doctor:  Fever - Oral temperature greater than 100.4 degrees Fahrenheit  Foul-smelling vaginal discharge  Difficulty urinating  Nausea and vomiting  Increased pain at the site of the incision that is unrelieved with pain medicine.  Difficulty breathing with or without chest pain  New calf pain especially if only on one side  Sudden, continuing increased vaginal bleeding with or without clots.   Contacts: For questions or concerns you should contact:  Dr. Everitt Amber or Dr. Jeral Pinch at [phone]  Joylene John, NP at [phone]  After Hours: call [phone] and have the GYN Oncologist paged/contacted  Information for Discharge Teaching: EXPAREL (bupivacaine liposome injectable suspension)   Your surgeon gave you EXPAREL(bupivacaine) in your surgical incision to help control your pain after surgery.   EXPAREL is a local anesthetic that provides pain relief by numbing the tissue around the surgical site.  EXPAREL is designed to release pain medication over time and can control pain for up to 72 hours.  Depending on how you respond to EXPAREL, you may require less pain medication during your recovery.  Possible side effects:  Temporary loss of sensation or ability to move in the area where bupivacaine was injected.  Nausea, vomiting, constipation  Rarely, numbness and tingling in your mouth or lips, lightheadedness, or anxiety may occur.  Call your doctor right away if you think you may be experiencing any of these sensations, or if you have other questions regarding possible side effects.  Follow all other discharge instructions given to you by your surgeon  or nurse. Eat a healthy diet and drink plenty of water or other fluids.  If you return to the hospital for any reason within 96 hours following the administration of EXPAREL, please inform your health care providers.DISCHARGE INSTRUCTIONS: Laparoscopy  The following instructions have been prepared to help you care for yourself upon your return home today.  Wound care: Marland Kitchen Do not get the incision wet for the first 24 hours. The incision should be kept clean and dry. . The Band-Aids or dressings may be removed the day after surgery. . Should the incision become sore, red, and swollen after the first week, check with your doctor.  Personal hygiene: . Shower the day after your procedure.  Activity and limitations: . Do NOT drive or operate any equipment today. . Do NOT lift anything more than 15 pounds for 2-3 weeks after surgery. . Do NOT rest in bed all day. . Walking is encouraged. Walk each day, starting slowly with 5-minute walks 3 or 4 times a day. Slowly increase the length of your walks. . Walk up and down stairs slowly. . Do NOT do strenuous activities, such as golfing, playing tennis, bowling, running, biking, weight lifting, gardening, mowing, or vacuuming for 2-4 weeks. Ask your doctor when it is okay to start.  Diet: Eat a light meal as desired this evening. You may resume your usual diet tomorrow.  Return to work: This is dependent on the type of work you do. For the most part you can return to a desk job within a week of surgery. If you are more active at work, please discuss this with your doctor.  What to expect after your surgery: You may have a slight burning sensation when you urinate on the first day. You may have a very small amount of blood in the urine. Expect to have a small amount of vaginal discharge/light bleeding for 1-2 weeks. It is not unusual to have abdominal soreness and bruising for up to 2 weeks. You may be tired and need more rest for about 1 week. You may  experience shoulder pain for 24-72 hours. Lying flat in bed may relieve it.  Call your doctor for any of the following: . Develop a fever of 100.4 or greater . Inability to urinate 6 hours after discharge from hospital . Severe pain not relieved by pain medications . Persistent of heavy bleeding at incision site . Redness or swelling around incision site after a week . Increasing nausea or vomiting   Post Anesthesia Home Care Instructions  Activity: Get plenty of rest for the remainder of the day. A responsible adult should stay with you for 24 hours following the procedure.  For the next 24 hours, DO NOT: -Drive a car -Paediatric nurse -Drink alcoholic beverages -Take any medication unless instructed by your physician -Make any legal decisions or sign important papers.  Meals: Start with liquid foods such as gelatin or soup. Progress to regular foods  as tolerated. Avoid greasy, spicy, heavy foods. If nausea and/or vomiting occur, drink only clear liquids until the nausea and/or vomiting subsides. Call your physician if vomiting continues.  Special Instructions/Symptoms: Your throat may feel dry or sore from the anesthesia or the breathing tube placed in your throat during surgery. If this causes discomfort, gargle with warm salt water. The discomfort should disappear within 24 hours.  If you had a scopolamine patch placed behind your ear for the management of post- operative nausea and/or vomiting:  1. The medication in the patch is effective for 72 hours, after which it should be removed.  Wrap patch in a tissue and discard in the trash. Wash hands thoroughly with soap and water. 2. You may remove the patch earlier than 72 hours if you experience unpleasant side effects which may include dry mouth, dizziness or visual disturbances. 3. Avoid touching the patch. Wash your hands with soap and water after contact with the patch.

## 2019-12-14 ENCOUNTER — Encounter (HOSPITAL_BASED_OUTPATIENT_CLINIC_OR_DEPARTMENT_OTHER): Payer: Self-pay | Admitting: Gynecologic Oncology

## 2019-12-14 ENCOUNTER — Telehealth: Payer: Self-pay

## 2019-12-14 NOTE — Telephone Encounter (Signed)
Ms Balducci states that she is eating, drinking, and urinating well. She has not passed gas.  Told her to increase the senokot-s to 2 tablets bid.  If she does not have a BM within 2 days, she can add Miralax 1 capful bid.  Afebrile.  Incisions are D&I. Told her that the Honeycomb dressing can be removed 5 days after surgery which will be Saturday 12-18-19. Pain controlled with current pain medications. Pt aware of post op appointments as well as the office number [phone] to call if any questions or concerns.

## 2019-12-15 LAB — SURGICAL PATHOLOGY

## 2019-12-16 ENCOUNTER — Encounter: Payer: Self-pay | Admitting: Oncology

## 2019-12-16 DIAGNOSIS — C541 Malignant neoplasm of endometrium: Secondary | ICD-10-CM

## 2019-12-21 ENCOUNTER — Telehealth: Payer: Self-pay

## 2019-12-21 ENCOUNTER — Encounter: Payer: Self-pay | Admitting: Gynecologic Oncology

## 2019-12-21 ENCOUNTER — Inpatient Hospital Stay: Payer: 59 | Attending: Gynecologic Oncology | Admitting: Gynecologic Oncology

## 2019-12-21 DIAGNOSIS — Z90710 Acquired absence of both cervix and uterus: Secondary | ICD-10-CM

## 2019-12-21 DIAGNOSIS — Z90722 Acquired absence of ovaries, bilateral: Secondary | ICD-10-CM

## 2019-12-21 DIAGNOSIS — C541 Malignant neoplasm of endometrium: Secondary | ICD-10-CM

## 2019-12-21 NOTE — Telephone Encounter (Addendum)
Per Dr. Berline Lopes: Please put Calling Mary Frank , surgery from last Monday, on the list for tomorrow. She's having some discharge but is not sure if it's old blood or concerning for infection. She's going to keep a close eye on it and knows someone would be calling tomorrow.

## 2019-12-21 NOTE — Progress Notes (Signed)
Gynecologic Oncology Telehealth Consult Note: Gyn-Onc  I connected with Mary Frank on 12/21/19 at  4:00 PM EDT by telephone and verified that I am speaking with the correct person using two identifiers.  I discussed the limitations, risks, security and privacy concerns of performing an evaluation and management service by telemedicine and the availability of in-person appointments. I also discussed with the patient that there may be a patient responsible charge related to this service. The patient expressed understanding and agreed to proceed.  Other persons participating in the visit and their role in the encounter: none.  Patient's location: home  Reason for Visit: follow-up after surgery, pathology review  Treatment History: Oncology History  Endometrial adenocarcinoma ([place])  10/05/2019 Imaging   Pelvic US: Uterus 11.6 x 7.3 x 8.3 cm with an endometrial lining measuring 1.5 cm.  Several uterine fibroids noted, the largest measuring 6.4 x 5.8 cm.   11/18/2019 Initial Biopsy   D&C: Endometrial adenocarcinoma, FIGO grade 1   11/24/2019 Initial Diagnosis   Endometrial adenocarcinoma ([place])   12/13/2019 Surgery   Robotic-assisted laparoscopic total hysterectomy >250gm with bilateral salpingoophorectomy, SLN biopsy, minilaparotomy  Findings: enlarged 500+gm uterus, normal tubes and ovaries, no gross extratuerine disease, no apparently suspicious lymph nodes.    12/13/2019 Pathology Results   A. SENTINEL LYMPH NODE, RIGHT EXTERNAL ILIAC, EXCISION:  - One lymph node with no metastatic carcinoma (0/1).   B. SENTINEL LYMPH NODE, LEFT EXTERNAL ILIAC, EXCISION:  - One lymph node with no metastatic carcinoma (0/1).   C. UTERUS, CERVIX, BILATERAL FALLOPIAN TUBES AND OVARIES:  - Endometrium:       Microscopic focus of endometrioid adenocarcinoma.       Inactive endometrium and extensive biopsy changes.       No myometrial invasion.       See oncology table.  - Cervix:       Slight  cervicitis and nabothian cyst.       No dysplasia or evidence of malignancy.  - Myometrium:       Leiomyomata.       No evidence of malignancy.  - Right and left ovaries:       Benign serous cysts.       No endometriosis or evidence of malignancy.  - Right and left fallopian tubes:       Benign paratubal cyst.       No endometriosis or evidence of malignancy.   ONCOLOGY TABLE:  UTERUS, CARCINOMA OR CARCINOSARCOMA  Procedure: Total hysterectomy and bilateral salpingo-oophorectomy.  Histologic type: Endometrioid.  Histologic Grade: FIGO grade 1.  Myometrial invasion:       Depth of invasion: 0 mm       Myometrial thickness: 15 mm  Uterine Serosa Involvement: Not present.  Cervical stromal involvement: Not present.  Extent of involvement of other organs: Not present.  Lymphovascular invasion: Not present.  Regional Lymph Nodes:       Examined:     2 Sentinel                               0 non-sentinel                               2 total        Lymph nodes with metastasis: 0        Isolated tumor cells (<0.2 mm): 0  Micrometastasis:  (>0.2 mm and < 2.0 mm): 0        Macrometastasis: (>2.0 mm): 0       Representative Tumor Block: C7  MMR / MSI testing: Insufficient residual tumor.  Pathologic Stage Classification (pTNM, AJCC 8th edition):  pT1a, pN0  Comments: The endometrium shows extensive biopsy changes and there is  focal inactive endometrium.  There is a microscopic focus of cribriform  glands with atypia consistent with minimal residual FIGO grade 1  endometrioid carcinoma.    12/13/2019 Cancer Staging   Staging form: Corpus Uteri - Carcinoma and Carcinosarcoma, AJCC 8th Edition - Clinical stage from 12/13/2019: FIGO Stage IA (cT1a, cN0(sn), cM0) - Signed by [name], MD on 12/21/2019     Interval History: Patient reports feeling better today for the first time since surgery. Has struggled with pain but now feels like she is on a good routine with  ibuprofen and tylenol. Denies any urinary symptoms, reports daily bowel function. Tolerating PO without nausea or emesis. Has had a small amount of vaginal spotting, today has noted some vaginal discharge, thinks may be foul smelling.  Blood sugars have been erratic.  Past Medical/Surgical History: Past Medical History:  Diagnosis Date  . Diabetes mellitus without complication ([place])   . Endometrial adenocarcinoma ([place])   . Hyperlipidemia   . Hypertension   . Hypothyroidism   . Iron deficiency anemia 01/17/2011  . Menopause   . Peripheral vascular disease ([place]) 1990   stripping   . Thyroid disease   . Type II or unspecified type diabetes mellitus without mention of complication, not stated as uncontrolled 02/04/2013  . Varicose veins   . Vitamin D deficiency     Past Surgical History:  Procedure Laterality Date  . ENDOMETRIAL ABLATION W/ NOVASURE  2015   Dr. [name]  . ROBOTIC ASSISTED TOTAL HYSTERECTOMY WITH BILATERAL SALPINGO OOPHERECTOMY Bilateral 12/13/2019   Procedure: XI ROBOTIC ASSISTED TOTAL HYSTERECTOMY WITH BILATERAL SALPINGO OOPHORECTOMY (SPECIMEN WEIGHING >250 GRAMS), MINI LAPAROTOMY;  Surgeon: Everitt Amber, MD;  Location: [facility];  Service: Gynecology;  Laterality: Bilateral;  . SENTINEL NODE BIOPSY N/A 12/13/2019   Procedure: SENTINEL LYMPH NODE BIOPSY;  Surgeon: Everitt Amber, MD;  Location: [facility];  Service: Gynecology;  Laterality: N/A;  . VEIN LIGATION AND STRIPPING Left 2017  . VEIN LIGATION AND STRIPPING Right 2017    Family History  Problem Relation Age of Onset  . Diabetes Mother   . Fibromyalgia Mother   . Arrhythmia Mother        Required chest compressions -  . Osteoporosis Mother   . Thyroid disease Father        hypothyroid  . Heart attack Father 25  . Thyroid disease Sister   . Osteoporosis Sister   . Anxiety disorder Daughter   . Colitis Daughter   . Depression Son   . Irritable bowel syndrome Son   . Breast  cancer Maternal Grandmother 80    Social History   Socioeconomic History  . Marital status: Married    Spouse name: Not on file  . Number of children: 2  . Years of education: 73  . Highest education level: High school graduate  Occupational History  . Not on file  Tobacco Use  . Smoking status: Former Smoker    Packs/day: 1.00    Years: 20.00    Pack years: 20.00    Types: Cigarettes    Start date: 03/21/1991    Quit date:  03/20/2010    Years since quitting: 9.7  . Smokeless tobacco: Never Used  Vaping Use  . Vaping Use: Never used  Substance and Sexual Activity  . Alcohol use: Yes    Alcohol/week: 2.0 standard drinks    Types: 2 Standard drinks or equivalent per week    Comment: 2 glasses a wine once a week  . Drug use: No  . Sexual activity: Yes    Partners: Male    Birth control/protection: Surgical, Post-menopausal  Other Topics Concern  . Not on file  Social History Narrative  . Not on file   Social Determinants of Health   Financial Resource Strain:   . Difficulty of Paying Living Expenses: Not on file  Food Insecurity:   . Worried About Charity fundraiser in the Last Year: Not on file  . Ran Out of Food in the Last Year: Not on file  Transportation Needs:   . Lack of Transportation (Medical): Not on file  . Lack of Transportation (Non-Medical): Not on file  Physical Activity:   . Days of Exercise per Week: Not on file  . Minutes of Exercise per Session: Not on file  Stress:   . Feeling of Stress : Not on file  Social Connections:   . Frequency of Communication with Friends and Family: Not on file  . Frequency of Social Gatherings with Friends and Family: Not on file  . Attends Religious Services: Not on file  . Active Member of Clubs or Organizations: Not on file  . Attends Archivist Meetings: Not on file  . Marital Status: Not on file    Current Medications:  Current Outpatient Medications:  .  aspirin EC 81 MG tablet, Take 1 tablet  (81 mg total) by mouth daily. Do not take for one week post-op. Swallow whole., Disp: 30 tablet, Rfl: 11 .  atorvastatin (LIPITOR) 80 MG tablet, Take 1 tablet Daily for Cholesterol (Patient taking differently: Take 80 mg by mouth daily. ), Disp: 90 tablet, Rfl: 0 .  Calcium Citrate-Vitamin D (CALCIUM CITRATE + D3 PO), Take 600 mg by mouth daily. , Disp: , Rfl:  .  Cholecalciferol 125 MCG (5000 UT) TABS, Take 5,000 Units by mouth daily., Disp: , Rfl:  .  Continuous Blood Gluc Sensor (FREESTYLE LIBRE 2 SENSOR) MISC, Inject 1 sensor to the skin every 14 days for continuous glucose monitoring., Disp: , Rfl:  .  ferrous fumarate (HEMOCYTE - 106 MG FE) 325 (106 FE) MG TABS, Take 1 tablet by mouth daily., Disp: , Rfl:  .  ibuprofen (ADVIL) 800 MG tablet, Take 1 tablet (800 mg total) by mouth every 8 (eight) hours as needed for moderate pain. For AFTER surgery, Disp: 30 tablet, Rfl: 0 .  insulin glargine (LANTUS) 100 UNIT/ML Solostar Pen, Inject 10-12 Units into the skin daily. , Disp: , Rfl:  .  insulin lispro (HUMALOG KWIKPEN) 100 UNIT/ML KwikPen, Inject 0-4 Units into the skin 3 (three) times daily before meals. Sliding scale: under 120 = 0 units  120 - 170 = 1 units  171 - 220 = 2 units  221 - 270 = 3 units  271 - 320 = 4 units  over 320 = 4 units, Disp: , Rfl:  .  levothyroxine (SYNTHROID) 137 MCG tablet, Take 1 tablet daily on an empty stomach with only water for 30 minutes & no Antacid meds, Calcium or Magnesium for 4 hours & avoid Biotin (Patient taking differently: Take 137 mcg by mouth  daily before breakfast. ), Disp: 90 tablet, Rfl: 0 .  lisinopril (ZESTRIL) 10 MG tablet, Take 1 tablet Daily for BP & Diabetic Kidney Protectipon (Patient taking differently: Take 10 mg by mouth daily. ), Disp: 90 tablet, Rfl: 0 .  Magnesium 500 MG CAPS, Take 1,000 mg by mouth daily. , Disp: , Rfl:  .  Multiple Vitamins-Minerals (MULTIVITAMIN WITH MINERALS) tablet, Take 1 tablet by mouth daily., Disp: , Rfl:  .  Omega-3  Fatty Acids (FISH OIL) 1200 MG CAPS, Take 1 capsule (1,200 mg total) by mouth 2 (two) times daily., Disp: , Rfl:  .  senna-docusate (SENOKOT-S) 8.6-50 MG tablet, Take 2 tablets by mouth at bedtime. For AFTER surgery, do not take if having diarrhea, Disp: 30 tablet, Rfl: 0 .  traMADol (ULTRAM) 50 MG tablet, Take 1 tablet (50 mg total) by mouth every 6 (six) hours as needed for severe pain. For AFTER surgery, do not take and drive, Disp: 10 tablet, Rfl: 0  Review of Symptoms: Pertinent positives as per HPI, otherwise ROS negative.  Physical Exam: LMP 08/11/2013  Deferred given limitations of phone visits.   Laboratory & Radiologic Studies: None new  Assessment & Plan: Mary Frank is a 64 y.o. woman with Stage IA grade 1 endometrioid adenocarcinoma who presents for follow-up.  Meeting milestones - has struggled with pain but this seems to have improved over the last 24 hours. Reports some discharge, is unsure if this is old blood or purulent/foul smelling. I have asked her to watch this closely for the next day. My office will call tomorrow to check in. We discussed precautions again. We reviewed the importance of good glycemic control to help prevent infection. She had lab work today and has a virtual visit with her MD who takes care of her diabetes.  We reviewed her pathology report in detail. She is happy with the news. Given early-stage, low-risk disease, she does not meet criteria for adjuvant therapy. We will discuss surveillance plan and review signs concerning for disease recurrence again at her in person visit.   I discussed the assessment and treatment plan with the patient. The patient was provided with an opportunity to ask questions and all were answered. The patient agreed with the plan and demonstrated an understanding of the instructions.   The patient was advised to call back or see an in-person evaluation if the symptoms worsen or if the condition fails to improve as  anticipated.   16 minutes of total time was spent for this patient encounter, including preparation, face-to-face counseling with the patient and coordination of care, and documentation of the encounter.   Jeral Pinch, MD  Division of Gynecologic Oncology  Department of Obstetrics and Gynecology  [facility]

## 2019-12-22 NOTE — Telephone Encounter (Signed)
LM to call the office to see how her vaginal discharge is today.

## 2019-12-22 NOTE — Telephone Encounter (Signed)
Ms Perfetti states that she has not had any discharge since yesterday.   Told her that she may experience some spotting between 6-8 weeks post op as the stiches dissolve in the vaginal cuff. Pt verbalized understanding.

## 2019-12-23 ENCOUNTER — Telehealth: Payer: Self-pay

## 2019-12-23 NOTE — Telephone Encounter (Signed)
TC from patient.  S/P RTHBSO, mini lap on 12/13/2019 w/ Dr. Berline Lopes.  Patient reports right abdominal incision feels "hard underneath".  Mary Frank denied any fevers, no warmth at site, no drainage, no discoloration and is not tender or painful.  Patient reports BS better than usual this week blood glucose readings have been around 120mg /dL w/ 1 elevation to 180mg /dL.  Patient denied any issues with other incisions.  Patient reassured this is likely sutures and or healing process.  Patient given s/s of infection and verbalized understanding. Patient appreciative of information provided.  Patient will call with any questions or concerns.

## 2019-12-24 ENCOUNTER — Other Ambulatory Visit: Payer: Self-pay | Admitting: Internal Medicine

## 2019-12-26 ENCOUNTER — Encounter (HOSPITAL_COMMUNITY): Payer: Self-pay

## 2019-12-26 ENCOUNTER — Other Ambulatory Visit: Payer: Self-pay

## 2019-12-26 ENCOUNTER — Emergency Department (HOSPITAL_COMMUNITY): Payer: 59

## 2019-12-26 ENCOUNTER — Emergency Department (HOSPITAL_COMMUNITY)
Admission: EM | Admit: 2019-12-26 | Discharge: 2019-12-26 | Disposition: A | Discharge status: Home / Self Care | Payer: 59 | Attending: Emergency Medicine | Admitting: Emergency Medicine

## 2019-12-26 DIAGNOSIS — G8918 Other acute postprocedural pain: Secondary | ICD-10-CM | POA: Insufficient documentation

## 2019-12-26 DIAGNOSIS — E1122 Type 2 diabetes mellitus with diabetic chronic kidney disease: Secondary | ICD-10-CM | POA: Insufficient documentation

## 2019-12-26 DIAGNOSIS — Z90711 Acquired absence of uterus with remaining cervical stump: Secondary | ICD-10-CM | POA: Diagnosis not present

## 2019-12-26 DIAGNOSIS — Z794 Long term (current) use of insulin: Secondary | ICD-10-CM | POA: Insufficient documentation

## 2019-12-26 DIAGNOSIS — L7634 Postprocedural seroma of skin and subcutaneous tissue following other procedure: Secondary | ICD-10-CM | POA: Diagnosis not present

## 2019-12-26 DIAGNOSIS — T888XXA Other specified complications of surgical and medical care, not elsewhere classified, initial encounter: Secondary | ICD-10-CM

## 2019-12-26 DIAGNOSIS — R1900 Intra-abdominal and pelvic swelling, mass and lump, unspecified site: Secondary | ICD-10-CM | POA: Diagnosis present

## 2019-12-26 DIAGNOSIS — I129 Hypertensive chronic kidney disease with stage 1 through stage 4 chronic kidney disease, or unspecified chronic kidney disease: Secondary | ICD-10-CM | POA: Diagnosis not present

## 2019-12-26 DIAGNOSIS — N182 Chronic kidney disease, stage 2 (mild): Secondary | ICD-10-CM | POA: Diagnosis not present

## 2019-12-26 DIAGNOSIS — Z7982 Long term (current) use of aspirin: Secondary | ICD-10-CM | POA: Insufficient documentation

## 2019-12-26 DIAGNOSIS — Z79899 Other long term (current) drug therapy: Secondary | ICD-10-CM | POA: Insufficient documentation

## 2019-12-26 DIAGNOSIS — Z87891 Personal history of nicotine dependence: Secondary | ICD-10-CM | POA: Insufficient documentation

## 2019-12-26 DIAGNOSIS — E039 Hypothyroidism, unspecified: Secondary | ICD-10-CM | POA: Insufficient documentation

## 2019-12-26 LAB — CBC WITH DIFFERENTIAL/PLATELET
Abs Immature Granulocytes: 0.11 10*3/uL — ABNORMAL HIGH (ref 0.00–0.07)
Basophils Absolute: 0.1 10*3/uL (ref 0.0–0.1)
Basophils Relative: 1 %
Eosinophils Absolute: 0.2 10*3/uL (ref 0.0–0.5)
Eosinophils Relative: 1 %
HCT: 38.8 % (ref 36.0–46.0)
Hemoglobin: 12.4 g/dL (ref 12.0–15.0)
Immature Granulocytes: 1 %
Lymphocytes Relative: 12 %
Lymphs Abs: 1.9 10*3/uL (ref 0.7–4.0)
MCH: 28.2 pg (ref 26.0–34.0)
MCHC: 32 g/dL (ref 30.0–36.0)
MCV: 88.2 fL (ref 80.0–100.0)
Monocytes Absolute: 0.9 10*3/uL (ref 0.1–1.0)
Monocytes Relative: 6 %
Neutro Abs: 12.1 10*3/uL — ABNORMAL HIGH (ref 1.7–7.7)
Neutrophils Relative %: 79 %
Platelets: 389 10*3/uL (ref 150–400)
RBC: 4.4 MIL/uL (ref 3.87–5.11)
RDW: 12.8 % (ref 11.5–15.5)
WBC: 15.1 10*3/uL — ABNORMAL HIGH (ref 4.0–10.5)
nRBC: 0 % (ref 0.0–0.2)

## 2019-12-26 LAB — COMPREHENSIVE METABOLIC PANEL
ALT: 23 U/L (ref 0–44)
AST: 17 U/L (ref 15–41)
Albumin: 3 g/dL — ABNORMAL LOW (ref 3.5–5.0)
Alkaline Phosphatase: 93 U/L (ref 38–126)
Anion gap: 10 (ref 5–15)
BUN: 24 mg/dL — ABNORMAL HIGH (ref 8–23)
CO2: 25 mmol/L (ref 22–32)
Calcium: 8.9 mg/dL (ref 8.9–10.3)
Chloride: 105 mmol/L (ref 98–111)
Creatinine, Ser: 0.67 mg/dL (ref 0.44–1.00)
GFR, Estimated: >60 mL/min (ref >60)
Glucose, Bld: 159 mg/dL — ABNORMAL HIGH (ref 70–99)
Potassium: 4.4 mmol/L (ref 3.5–5.1)
Sodium: 140 mmol/L (ref 135–145)
Total Bilirubin: 0.3 mg/dL (ref 0.3–1.2)
Total Protein: 6.7 g/dL (ref 6.5–8.1)

## 2019-12-26 LAB — WET PREP, GENITAL
Clue Cells Wet Prep HPF POC: NONE SEEN
Sperm: NONE SEEN
Trich, Wet Prep: NONE SEEN
Yeast Wet Prep HPF POC: NONE SEEN

## 2019-12-26 MED ORDER — IOHEXOL 9 MG/ML PO SOLN: 500.0000 mL | ORAL | Status: AC

## 2019-12-26 MED ORDER — CLINDAMYCIN HCL 150 MG PO CAPS: 300.0000 mg | ORAL_CAPSULE | Freq: Three times a day (TID) | ORAL | 0 refills | Status: AC

## 2019-12-26 MED ORDER — CLINDAMYCIN PHOSPHATE 300 MG/50ML IV SOLN
300.0000 mg | Freq: Once | INTRAVENOUS | Status: AC
  Administered 2019-12-26: 300 mg via INTRAVENOUS
  Filled 2019-12-26: qty 50

## 2019-12-26 MED ORDER — IOHEXOL 9 MG/ML PO SOLN
ORAL | Status: AC
  Administered 2019-12-26: 500 mL
  Filled 2019-12-26: qty 500

## 2019-12-26 MED ORDER — IOHEXOL 300 MG/ML  SOLN
100.0000 mL | Freq: Once | INTRAMUSCULAR | Status: AC | PRN
  Administered 2019-12-26: 100 mL via INTRAVENOUS

## 2019-12-26 NOTE — ED Provider Notes (Signed)
[facility] DEPT Provider Note   CSN: [id number] Arrival date & time: 12/26/19  8413     History Chief Complaint  Patient presents with  . Post-op Problem    Mary Frank is a 64 y.o. female presenting for evaluation of swollen and tender lesion of the abd.   Pt states she had a total hysterectomy on 10/11 due to endometrial cancer. 2 days ago, she developed pain and swelling of her abd wall. She also has increased foul smelling d/c from the vagina. No fevers, chills, n/v, weakness, confusion. No cp, cough, sob, urinary sxs, or abnormal BMs. She called her doctor's office who recommended she come to the ED for evaluation. She reports a h/o Type 1 DM, today her BGL was elevated at 190.  Additional history of hypothyroidism, she reports no other medical problems.  HPI     Past Medical History:  Diagnosis Date  . Diabetes mellitus without complication ([place])   . Endometrial adenocarcinoma ([place])   . Hyperlipidemia   . Hypertension   . Hypothyroidism   . Iron deficiency anemia 01/17/2011  . Menopause   . Peripheral vascular disease ([place]) 1990   stripping   . Thyroid disease   . Type II or unspecified type diabetes mellitus without mention of complication, not stated as uncontrolled 02/04/2013  . Varicose veins   . Vitamin D deficiency     Patient Active Problem List   Diagnosis Date Noted  . Endometrial adenocarcinoma ([place]) 11/24/2019  . Osteopenia 11/16/2018  . CKD stage 2 due to type 2 diabetes mellitus ([place]) 05/15/2018  . Systolic murmur 24/40/1027  . Varicose veins of lower extremities with complications 25/36/6440  . Vitamin D deficiency 12/05/2013  . Medication management 12/05/2013  . Essential hypertension 02/04/2013  . Hyperlipidemia associated with type 2 diabetes mellitus ([place]) 02/04/2013  . LADA (latent autoimmune diabetes in adults), managed as type 1 ([place]) 02/04/2013  . Hypothyroidism 02/04/2013    Past Surgical History:    Procedure Laterality Date  . ENDOMETRIAL ABLATION W/ NOVASURE  2015   Dr. [name]  . ROBOTIC ASSISTED TOTAL HYSTERECTOMY WITH BILATERAL SALPINGO OOPHERECTOMY Bilateral 12/13/2019   Procedure: XI ROBOTIC ASSISTED TOTAL HYSTERECTOMY WITH BILATERAL SALPINGO OOPHORECTOMY (SPECIMEN WEIGHING >250 GRAMS), MINI LAPAROTOMY;  Surgeon: Everitt Amber, MD;  Location: [facility];  Service: Gynecology;  Laterality: Bilateral;  . SENTINEL NODE BIOPSY N/A 12/13/2019   Procedure: SENTINEL LYMPH NODE BIOPSY;  Surgeon: Everitt Amber, MD;  Location: [facility];  Service: Gynecology;  Laterality: N/A;  . VEIN LIGATION AND STRIPPING Left 2017  . VEIN LIGATION AND STRIPPING Right 2017     OB History    Gravida  2   Para  2   Term      Preterm      AB      Living  2     SAB      TAB      Ectopic      Multiple      Live Births              Family History  Problem Relation Age of Onset  . Diabetes Mother   . Fibromyalgia Mother   . Arrhythmia Mother        Required chest compressions -  . Osteoporosis Mother   . Thyroid disease Father        hypothyroid  . Heart attack Father 47  . Thyroid disease Sister   .  Osteoporosis Sister   . Anxiety disorder Daughter   . Colitis Daughter   . Depression Son   . Irritable bowel syndrome Son   . Breast cancer Maternal Grandmother 38    Social History   Tobacco Use  . Smoking status: Former Smoker    Packs/day: 1.00    Years: 20.00    Pack years: 20.00    Types: Cigarettes    Start date: 03/21/1991    Quit date: 03/20/2010    Years since quitting: 9.7  . Smokeless tobacco: Never Used  Vaping Use  . Vaping Use: Never used  Substance Use Topics  . Alcohol use: Yes    Alcohol/week: 2.0 standard drinks    Types: 2 Standard drinks or equivalent per week    Comment: 2 glasses a wine once a week  . Drug use: No    Home Medications Prior to Admission medications   Medication Sig Start Date End Date Taking?  Authorizing Provider  acetaminophen (TYLENOL) 500 MG tablet Take 500 mg by mouth every 6 (six) hours as needed for mild pain.   Yes [provider]  atorvastatin (LIPITOR) 80 MG tablet Take      1 tablet      Daily      for Cholesterol Patient taking differently: Take 40 mg by mouth in the morning and at bedtime. Take      1 tablet      Daily      for Cholesterol 12/24/19  Yes Unk Pinto, MD  Calcium Citrate-Vitamin D (CALCIUM CITRATE + D3 PO) Take 600 mg by mouth daily.    Yes [provider]  Cholecalciferol 125 MCG (5000 UT) TABS Take 5,000 Units by mouth daily.   Yes [provider]  ferrous fumarate (HEMOCYTE - 106 MG FE) 325 (106 FE) MG TABS Take 1 tablet by mouth daily.   Yes [provider]  ibuprofen (ADVIL) 800 MG tablet Take 1 tablet (800 mg total) by mouth every 8 (eight) hours as needed for moderate pain. For AFTER surgery 11/26/19  Yes Cross, Melissa D, NP  insulin glargine (LANTUS) 100 UNIT/ML Solostar Pen Inject 10-16 Units into the skin daily.    Yes [provider]  insulin lispro (HUMALOG KWIKPEN) 100 UNIT/ML KwikPen Inject 0-4 Units into the skin 3 (three) times daily before meals. Sliding scale: under 120 = 0 units  120 - 170 = 1 units  171 - 220 = 2 units  221 - 270 = 3 units  271 - 320 = 4 units  over 320 = 4 units   Yes [provider]  levothyroxine (SYNTHROID) 137 MCG tablet Take 1 tablet daily on an empty stomach with only water for 30 minutes & no Antacid meds, Calcium or Magnesium for 4 hours & avoid Biotin Patient taking differently: Take 137 mcg by mouth daily before breakfast.  10/04/19  Yes Unk Pinto, MD  lisinopril (ZESTRIL) 10 MG tablet Take      1 tablet       Daily       for BP & Diabetic Kidney Protection 12/24/19  Yes Unk Pinto, MD  Magnesium 500 MG CAPS Take 1,000 mg by mouth daily.    Yes [provider]  Multiple Vitamins-Minerals (MULTIVITAMIN WITH MINERALS) tablet Take 1  tablet by mouth daily.   Yes [provider]  Omega-3 Fatty Acids (FISH OIL) 1200 MG CAPS Take 1 capsule (1,200 mg total) by mouth 2 (two) times daily. Patient taking  differently: Take 1,200 mg by mouth daily.  12/06/13  Yes Unk Pinto, MD  senna-docusate (SENOKOT-S) 8.6-50 MG tablet Take 2 tablets by mouth at bedtime. For AFTER surgery, do not take if having diarrhea 11/26/19  Yes Cross, Melissa D, NP  traMADol (ULTRAM) 50 MG tablet Take 1 tablet (50 mg total) by mouth every 6 (six) hours as needed for severe pain. For AFTER surgery, do not take and drive 0/76/80  Yes Cross, Melissa D, NP  aspirin EC 81 MG tablet Take 1 tablet (81 mg total) by mouth daily. Do not take for one week post-op. Swallow whole. 12/13/19   Cross, Lenna Sciara D, NP  clindamycin (CLEOCIN) 150 MG capsule Take 2 capsules (300 mg total) by mouth 3 (three) times daily for 7 days. 12/26/19 01/02/20  Jermal Dismuke, PA-C  Continuous Blood Gluc Sensor (FREESTYLE LIBRE 2 SENSOR) MISC Inject 1 sensor to the skin every 14 days for continuous glucose monitoring. 09/28/19   [provider]    Allergies    Patient has no known allergies.  Review of Systems   Review of Systems  Gastrointestinal: Positive for abdominal pain.  Genitourinary: Positive for vaginal discharge.    Physical Exam Updated Vital Signs BP 116/75   Pulse 66   Temp 98 F (36.7 C) (Oral)   Resp 16   LMP 08/11/2013   SpO2 99%   Physical Exam Vitals and nursing note reviewed. Exam conducted with a chaperone present.  Constitutional:      General: She is not in acute distress.    Appearance: She is well-developed.     Comments: Appears nontoxic  HENT:     Head: Normocephalic and atraumatic.  Eyes:     Conjunctiva/sclera: Conjunctivae normal.     Pupils: Pupils are equal, round, and reactive to light.  Cardiovascular:     Rate and Rhythm: Normal rate and regular rhythm.     Pulses: Normal pulses.  Pulmonary:     Effort:  Pulmonary effort is normal. No respiratory distress.     Breath sounds: Normal breath sounds. No wheezing.  Abdominal:     General: There is no distension.     Palpations: Abdomen is soft.     Tenderness: There is no abdominal tenderness.       Comments: Incisions noted on the abdomen.  Umbilical and left-sided incisions healing well without erythema or tenderness.  Right sided incision with swelling, tenderness, erythema, and warmth.  No active drainage.  No fluctuance. No TTP elsewhere in the abdomen.  No rigidity, guarding, distention.  Genitourinary:    Comments: Thin white discharge noted in the cervical canal.  Sutures visible, no swelling or obvious abscess.  No tenderness on bimanual. Musculoskeletal:        General: Normal range of motion.     Cervical back: Normal range of motion and neck supple.  Skin:    General: Skin is warm and dry.     Capillary Refill: Capillary refill takes less than 2 seconds.  Neurological:     Mental Status: She is alert and oriented to person, place, and time.     ED Results / Procedures / Treatments   Labs (all labs ordered are listed, but only abnormal results are displayed) Labs Reviewed  WET PREP, GENITAL - Abnormal; Notable for the following components:      Result Value   WBC, Wet Prep HPF POC MANY (*)    All other components within normal limits  CBC WITH DIFFERENTIAL/PLATELET - Abnormal;  Notable for the following components:   WBC 15.1 (*)    Neutro Abs 12.1 (*)    Abs Immature Granulocytes 0.11 (*)    All other components within normal limits  COMPREHENSIVE METABOLIC PANEL - Abnormal; Notable for the following components:   Glucose, Bld 159 (*)    BUN 24 (*)    Albumin 3.0 (*)    All other components within normal limits  GC/CHLAMYDIA PROBE AMP ([place]) NOT AT [facility]    EKG None  Radiology CT ABDOMEN PELVIS W CONTRAST  Result Date: 12/26/2019 CLINICAL DATA:  Possible abdominal infection after hysterectomy 2 weeks ago  for endometrial carcinoma. EXAM: CT ABDOMEN AND PELVIS WITH CONTRAST TECHNIQUE: Multidetector CT imaging of the abdomen and pelvis was performed using the standard protocol following bolus administration of intravenous contrast. CONTRAST:  177mL OMNIPAQUE IOHEXOL 300 MG/ML  SOLN COMPARISON:  February 15, 2009. FINDINGS: Lower chest: No acute abnormality. Hepatobiliary: No focal liver abnormality is seen. No gallstones, gallbladder wall thickening, or biliary dilatation. Pancreas: Unremarkable. No pancreatic ductal dilatation or surrounding inflammatory changes. Spleen: Calcified splenic granulomata are noted. Adrenals/Urinary Tract: Adrenal glands are unremarkable. Kidneys are normal, without renal calculi, focal lesion, or hydronephrosis. Bladder is unremarkable. Stomach/Bowel: The stomach appears normal. Postsurgical changes are seen involving the cecum. There is no evidence of bowel obstruction or inflammation. Stool is noted throughout the colon. Vascular/Lymphatic: Thrombosis of the residual right ovarian vein is noted. No significant adenopathy is noted. Reproductive: Status post recent hysterectomy. 3.9 x 3.1 cm fluid collection is noted posteriorly in the pelvis in the pre rectal space which may represent seroma, but abscess cannot be excluded. Other: 4.8 x 1.6 cm lenticular shaped fluid collection which contains a small amount of gas is seen in the subcutaneous tissues of the right lower quadrant anterior abdominal wall. This most likely represents postoperative seroma, although small abscess cannot be excluded. Musculoskeletal: No acute or significant osseous findings. IMPRESSION: 1. Status post recent hysterectomy. 3.9 x 3.1 cm fluid collection is noted posteriorly in the pelvis in the pre rectal space which may represent seroma, but abscess cannot be excluded. 2. 4.8 x 1.6 cm lenticular shaped fluid collection which contains a small amount of gas is seen in the subcutaneous tissues of the right lower  quadrant anterior abdominal wall. This most likely represents postoperative seroma, although small abscess cannot be excluded. 3. Thrombosis of the residual right ovarian vein is noted. Electronically Signed   By: Marijo Conception M.D.   On: 12/26/2019 13:08    Procedures Procedures (including critical care time)  Medications Ordered in ED Medications  iohexol (OMNIPAQUE) 9 MG/ML oral solution 500 mL (has no administration in time range)  clindamycin (CLEOCIN) IVPB 300 mg (300 mg Intravenous New Bag/Given 12/26/19 1402)  iohexol (OMNIPAQUE) 9 MG/ML oral solution (500 mLs  Contrast Given 12/26/19 1044)  iohexol (OMNIPAQUE) 300 MG/ML solution 100 mL (100 mLs Intravenous Contrast Given 12/26/19 1228)    ED Course  I have reviewed the triage vital signs and the nursing notes.  Pertinent labs & imaging results that were available during my care of the patient were reviewed by me and considered in my medical decision making (see chart for details).    MDM Rules/Calculators/A&P                          Patient presenting for evaluation of pain and swelling near one of the incision sites after a recent total hysterectomy.  On exam, patient nontoxic.  Vitals are stable.  Exam is not consistent with sepsis.  However, her abdominal exam does show an area of warmth, erythema, tenderness, concern for infection.  She will need labs and CT.  Additionally, patient having vaginal discharge.  GU exam shows thin white discharge, no obvious infection around the sutures.  No significant tenderness.  Will have patient follow-up with OB/GYN, obtain wet prep and gonorrhea and chlamydia.  Wet prep negative for BV or yeast.  Labs show mild leukocytosis of 15, otherwise reassuring.  CT pending.  CT shows 4.8 x 1.6 cm fluid collection around the incision site.  Likely seroma, however cannot rule out abscess.  In the setting of new skin erythema, tenderness, warmth, concern for infection.  Will consult with Dr. Denman George  from [facility].  Discussed with Dr. Denman George, who recommends antibiotics and close follow-up in the office.  Discussed with patient, who is agreeable with plan.  First dose given in the ED.  Discussed return precautions including signs of worsening infection/sepsis.  At this time, patient appears safe for discharge.   Patient states she understands and agrees to plan.  Final Clinical Impression(s) / ED Diagnoses Final diagnoses:  Post-op pain  Fluid collection at surgical site, initial encounter    Rx / DC Orders ED Discharge Orders         Ordered    clindamycin (CLEOCIN) 150 MG capsule  3 times daily        12/26/19 1348           Neshia Mckenzie, PA-C 12/26/19 1406    Daleen Bo, MD 12/26/19 1414

## 2019-12-26 NOTE — Discharge Instructions (Signed)
Take clindamycin as prescribed.  Use tylenol and ibuprofen as needed for pain.  Follow up with Dr. Denman George for further evaluation of your pain and swelling.  Return to the ER if you develop fevers, vomiting, weakness, confusion, or any new, worsening, or concerning symptoms.

## 2019-12-26 NOTE — ED Notes (Signed)
Patient to CT.

## 2019-12-26 NOTE — ED Triage Notes (Signed)
Pt arrived via walk in, s/p hysterectomy x2 weeks ago, started developing some redness and swelling at incision site x2 days ago, worsening pain and swelling since. Also endorsing some "foul smelling discharge" per pt. Denies any fevers/chills at home.

## 2019-12-26 NOTE — ED Notes (Signed)
To CT

## 2019-12-27 ENCOUNTER — Telehealth: Payer: Self-pay | Admitting: *Deleted

## 2019-12-27 LAB — GC/CHLAMYDIA PROBE AMP (~~LOC~~) NOT AT ARMC
Chlamydia: NEGATIVE
Comment: NEGATIVE
Comment: NORMAL
Neisseria Gonorrhea: NEGATIVE

## 2019-12-27 NOTE — Telephone Encounter (Signed)
Per Dr Denman George, called and scheduled the patient to see [facility] APP tomorrow

## 2019-12-28 ENCOUNTER — Encounter: Payer: Self-pay | Admitting: Gynecologic Oncology

## 2019-12-28 ENCOUNTER — Inpatient Hospital Stay: Payer: 59

## 2019-12-28 ENCOUNTER — Other Ambulatory Visit: Payer: Self-pay

## 2019-12-28 ENCOUNTER — Telehealth: Payer: Self-pay

## 2019-12-28 ENCOUNTER — Inpatient Hospital Stay (HOSPITAL_BASED_OUTPATIENT_CLINIC_OR_DEPARTMENT_OTHER): Payer: 59 | Admitting: Gynecologic Oncology

## 2019-12-28 VITALS — BP 106/64 | HR 75 | Temp 98.4°F | Resp 16 | Ht 64.0 in | Wt 150.6 lb

## 2019-12-28 DIAGNOSIS — Z90722 Acquired absence of ovaries, bilateral: Secondary | ICD-10-CM | POA: Diagnosis not present

## 2019-12-28 DIAGNOSIS — D72829 Elevated white blood cell count, unspecified: Secondary | ICD-10-CM

## 2019-12-28 DIAGNOSIS — Z90710 Acquired absence of both cervix and uterus: Secondary | ICD-10-CM | POA: Diagnosis not present

## 2019-12-28 DIAGNOSIS — C541 Malignant neoplasm of endometrium: Secondary | ICD-10-CM

## 2019-12-28 LAB — CBC WITH DIFFERENTIAL (CANCER CENTER ONLY)
Abs Immature Granulocytes: 0.08 10*3/uL — ABNORMAL HIGH (ref 0.00–0.07)
Basophils Absolute: 0.1 10*3/uL (ref 0.0–0.1)
Basophils Relative: 1 %
Eosinophils Absolute: 0.1 10*3/uL (ref 0.0–0.5)
Eosinophils Relative: 1 %
HCT: 38.8 % (ref 36.0–46.0)
Hemoglobin: 12.3 g/dL (ref 12.0–15.0)
Immature Granulocytes: 1 %
Lymphocytes Relative: 13 %
Lymphs Abs: 1.6 10*3/uL (ref 0.7–4.0)
MCH: 27.5 pg (ref 26.0–34.0)
MCHC: 31.7 g/dL (ref 30.0–36.0)
MCV: 86.8 fL (ref 80.0–100.0)
Monocytes Absolute: 0.9 10*3/uL (ref 0.1–1.0)
Monocytes Relative: 7 %
Neutro Abs: 9.6 10*3/uL — ABNORMAL HIGH (ref 1.7–7.7)
Neutrophils Relative %: 77 %
Platelet Count: 449 10*3/uL — ABNORMAL HIGH (ref 150–400)
RBC: 4.47 MIL/uL (ref 3.87–5.11)
RDW: 12.8 % (ref 11.5–15.5)
WBC Count: 12.4 10*3/uL — ABNORMAL HIGH (ref 4.0–10.5)
nRBC: 0 % (ref 0.0–0.2)

## 2019-12-28 NOTE — Patient Instructions (Signed)
We recommend continuing the prescribed antibiotic for the full course prescribed. We will plan on seeing you on Friday to check the incision. If you feel that the area of concern worsens, increases in size, begins draining, fever develops, you start feeling poorly please call the office or the after hours number if it is after 5 pm or on the weekend at [phone]. We will contact you with the results of your lab work from today. If the area does not improve or worsens, an incision with drainage of the area may be considered.

## 2019-12-28 NOTE — Telephone Encounter (Signed)
LM for Mary Frank stating that her WBC count is improving. It was 15.1 on 12-26-19 and today it is 12.4. She can call the office at [phone] if she has any questions or concerns.

## 2019-12-29 NOTE — Progress Notes (Signed)
GYN Oncology Follow up  HPI: Mary Frank is a 64 year old female s/p robotic-assisted laparoscopic total hysterectomy >250gm with bilateral salpingoophorectomy, SLN biopsy, minilaparotomy on 12/13/19 with Dr. Everitt Amber for endometrial cancer. She presented to the ER on 12/26/2019 for swelling and tenderness of one of her abdominal incisions. She had developed pain in this area and had foul smelling discharge from her vagina. A CT scan was performed resulting 1. Status post recent hysterectomy. 3.9 x 3.1 cm fluid collection is noted posteriorly in the pelvis in the pre rectal space which may represent seroma, but abscess cannot be excluded. 2. 4.8 x 1.6 cm lenticular shaped fluid collection which contains a small amount of gas is seen in the subcutaneous tissues of the right lower quadrant anterior abdominal wall. This most likely represents postoperative seroma, although small abscess cannot be excluded. 3. Thrombosis of the residual right ovarian vein is noted. She was started on clindamycin for a total of 7 days. Her WBC count in the ER was 15.1.  Interval Hx: She presents today with her husband for follow up post-op after a recent ER visit. She states she feels she is improving. No fever or chills. She notices a decrease in size of the area. No drainage from the incision medial to the palpated collection. Vaginal discharge has almost resolved. No concerns voiced about her other incisions. No concerns voiced.  Exam: Alert, oriented x3, in no acute distress. Lungs clear. Heart regular in rate and rhythm. Abdomen soft. Incisions are well healed with no active drainage or erythema noted. Lateral to the right lower quadrant incision, a plum sized area can be palpated under the skin. No visibile lesions or hematomas noted on the skin. Area is slightly warmer with mild erythema than surrounding skin. See media for picture.   Plan: Since patient feels she is improving (remaining afebrile) and area on the right  lateral abdomen is decreasing in size (per pt), plan will be for the patient to continue taking clindamycin and to follow up on Friday in the office for re-evaluation. Reportable signs and symptoms reviewed. She is advised to call the office sooner if the area does not continue to improve or for new symptoms such as fever, drainage, etc. All questions answered. A repeat CBC will be checked today to re-evaluate her WBC count.

## 2019-12-31 ENCOUNTER — Inpatient Hospital Stay (HOSPITAL_BASED_OUTPATIENT_CLINIC_OR_DEPARTMENT_OTHER): Payer: 59 | Admitting: Gynecologic Oncology

## 2019-12-31 ENCOUNTER — Inpatient Hospital Stay: Payer: 59

## 2019-12-31 ENCOUNTER — Other Ambulatory Visit: Payer: Self-pay

## 2019-12-31 ENCOUNTER — Other Ambulatory Visit: Payer: Self-pay | Admitting: Internal Medicine

## 2019-12-31 ENCOUNTER — Encounter: Payer: Self-pay | Admitting: Gynecologic Oncology

## 2019-12-31 VITALS — BP 108/71 | HR 77 | Temp 98.3°F | Resp 18 | Ht 64.0 in | Wt 150.0 lb

## 2019-12-31 DIAGNOSIS — C541 Malignant neoplasm of endometrium: Secondary | ICD-10-CM | POA: Diagnosis not present

## 2019-12-31 DIAGNOSIS — S301XXD Contusion of abdominal wall, subsequent encounter: Secondary | ICD-10-CM

## 2019-12-31 DIAGNOSIS — E890 Postprocedural hypothyroidism: Secondary | ICD-10-CM

## 2019-12-31 DIAGNOSIS — L02211 Cutaneous abscess of abdominal wall: Secondary | ICD-10-CM

## 2019-12-31 DIAGNOSIS — S301XXA Contusion of abdominal wall, initial encounter: Secondary | ICD-10-CM

## 2019-12-31 NOTE — Progress Notes (Signed)
GYN Oncology Follow up  HPI: Mary Frank is a 64 year old female s/p robotic-assisted laparoscopic total hysterectomy >250gm with bilateral salpingoophorectomy, SLN biopsy, minilaparotomy on 12/13/19 with Dr. Everitt Amber for endometrial cancer. She presented to the ER on 12/26/2019 for swelling and tenderness of one of her abdominal incisions. She had developed pain in this area and had foul smelling discharge from her vagina. A CT scan was performed resulting 1. Status post recent hysterectomy. 3.9 x 3.1 cm fluid collection is noted posteriorly in the pelvis in the pre rectal space which may represent seroma, but abscess cannot be excluded. 2. 4.8 x 1.6 cm lenticular shaped fluid collection which contains a small amount of gas is seen in the subcutaneous tissues of the right lower quadrant anterior abdominal wall. This most likely represents postoperative seroma, although small abscess cannot be excluded. 3. Thrombosis of the residual right ovarian vein is noted. She was started on clindamycin for a total of 7 days. Her WBC count in the ER was 15.1.  Interval Hx: She presents today with her husband for evaluation of her right sided abdominal hematoma/fluid collection. She states she had a rough day with pain at the site on Wednesday but felt better on Thursday. No fever or chills. She notices the area has not increased or decreased in size. No drainage from the incision medial to the palpated collection. Vaginal discharge has almost resolved. No concerns voiced about her other incisions. No concerns voiced.  Exam: Alert, oriented x3, in no acute distress. Incisions are well healed with no active drainage or erythema noted. Lateral to the right lower quadrant incision, the plum sized area can still be palpated under the skin and is same in size. No visibile lesions or hematomas noted on the skin. Area is slightly warmer with mild erythema than surrounding skin. Dr. Denman George to the room for evaluation. With the  patient's consent, the area was cleansed with betadine and injected with 1 cc of 2% lidocaine by Dr. Denman George. With a spinal needle and 10 cc syringe, the collection was accessed with around 1 cc of cloudy drainage aspirated. The decision was made to proceed with an incision and drainage of the area. 11 blade scalpel used to make a 1.5 cm incision over the collection. Cavity debrided by Dr. Denman George with old hematoma evacuated. Cavity packed with iodoform packing strip. Patient tolerated well. Dry dressing applied    Plan: S/P incision and drainage of post-operative hematoma of the right abdomen. Fluid aspirated from the collection will be sent for culture. Patient and husband instructed on twice daily dressing changes with packing of the cavity. She is advised to continue taking the clindamycin as prescribed. Reportable signs and symptoms reviewed. She is to follow up as scheduled or sooner if needed. Dressing supplies given to the patient.   Attending Surgeon Note:  The patient was seen and examined by me.  I agree with the attached note.  The patient reported development of a palpable lump in her right lateral abdomen lateral to the right sided port.  It had been imaged and consistent with either hematoma or abscess.  A short course of oral antibiotics had improved symptoms and improved her white blood cell count but failed to resolve area.  Therefore decision was made to perform incision and drainage at the bedside to alleviate retained infection.  Procedure Note:  Preop Dx: Abdominal wall abscess Postop Dx: Same Procedure: Incision and drainage of abscess Surgeon: Dorann Ou, MD EBL: 10 cc Specimens:  Wound culture for aerobic and anaerobic assessment Complications: None Procedure Details: The patient provided verbal consent and verbal timeout was performed.  1 cc of 2% lidocaine was infiltrated into the skin overlying the palpable abscess in the right lateral flank.  Initially a spinal needle was  inserted into the abscess cavity and approximately 2 cc of purulent fluid was aspirated and sent for aerobic and anaerobic culture.  When no additional fluid could be aspirated decision was made to perform an scalpel incision using 11 blade overlying the midpoint of the abscess where it was pointing towards the skin.  A Q-tip was introduced through the skin incision and eruption of purulent fluid and old blood occurred through the 5 mm incision.  The purulent and bloody fluid was expressed from the abscess cavity.  The abscess cavity was then packed with quarter inch iodoform gauze strip.  The patient's husband was instructed on how to pack the wound daily during recovery.  An ABD pad was placed overlying the wound.  The patient tolerated the procedure well.

## 2019-12-31 NOTE — Patient Instructions (Signed)
Plan to pack the open area at least once a day, preferably twice. You can remove the packing strip when you are taking a shower. You would need to pack the cavity as much as you can but not overpacked. Continue the antibiotics as prescribed. We will call you with the results of the culture from the fluid taken today to see if you need to be on a different antibiotic. If it looks likes the skin is closing, call the office since we want the skin to stay open to drain the cavity. Please call if you feel the area is not improving, fever develops, feeling poorly etc.   How to Change Your Wound Dressing  A dressing is a material that is placed in and over a wound. A dressing helps your wound heal by protecting it from:  Germs (bacteria).  Another injury.  Getting too dry or too wet. There are several types of wound dressings. Some examples are:  Bandages.  Gauze pads.  Foam pads.  Antimicrobial dressings. These prevent or treat infection and may contain something that kills germs (an antiseptic), such as silver or iodine.  Calcium alginate. These are general guidelines. Follow your doctor's instructions about how to care for your wound. What are the risks? It is usually safe to change your dressing. The sticky (adhesive) tape that is used with a dressing may make your skin sore or irritated, or it may cause a rash. These are the most common problems. However, more serious problems can happen, such as:  Bleeding.  Infection. Supplies needed: Set up a clean area for wound care. You will need:  A trash bag that you can throw away. Have it open and ready to use.  Hand sanitizer.  Cleaning solution as told by your doctor. This may include: ? Germ-free (sterile) water. ? Germ-free salt water (saline). ? Wound cleanser. ? New wound dressing material. Make sure to open the dressing package so the dressing stays on the inside of the package. You may also need the following in your clean  area:  A box of vinyl gloves.  Tape.  Adhesive remover.  Skin protectant. This may be a wipe, film, or spray.  Clean or germ-free scissors.  A cotton-tipped applicator. How to change your dressing How often you change your dressing will depend on your wound. Change the dressing as often as told by your doctor. Your doctor may change your dressing, or a family member, friend, or caregiver may be shown how to do it. It is important to:  Wash your hands before and after each dressing change. If you cannot use soap and water, use hand sanitizer.  Wear gloves and protective clothing while changing a dressing. This may include eye protection.  Never let anyone change your dressing if he or she has an infection, a skin problem, or a skin wound or cut of any size. Preparing to change your dressing  Take a shower before you do the first dressing change of the day. Before you shower, ask your doctor if you should put plastic leak-proof sealing wrap over your dressing to protect it.  If needed, take pain medicine 30 minutes before you change your dressing as told by your doctor. Taking off your old dressing   Wash your hands with soap and water. Dry your hands with a clean towel. If you cannot use soap and water, use hand sanitizer.  Go to the clean area that you have set up with all the supplies you will  need.  If you are using gloves, put the gloves on before you take off the dressing.  Gently take off any adhesive or tape by pulling it off in the direction of your hair growth. Only touch the outside edges of the dressing. ? If you are told to use an adhesive remover to loosen the edges of the dressing, make sure to avoid the wound area.  Take off the dressing. If the dressing sticks to your skin, wet the dressing with the cleaner that your doctor says to use. This helps it come off more easily.  Take off any gauze or packing in your wound.  Throw the old dressing supplies into the  trash bag.  Take off your gloves. To take off each glove, grab the cuff with your other hand and turn the glove inside out. Put the gloves in the trash right away.  Wash your hands with soap and water. Dry your hands with a clean towel. If you cannot use soap and water, use hand sanitizer. Cleaning your wound  Follow instructions from your doctor about how to clean your wound. This may include using the cleaner that your doctor recommends. ? You may need to use germ-free water to clean your wound if you are putting on a dressing that has silver in it.  Do not use over-the-counter medicated or antiseptic creams, sprays, liquids, or dressings unless your doctor tells you to do that.  Use a clean gauze pad to clean the area fully with the cleaner that your doctor recommends.  Throw the gauze pad into the trash bag.  Wash your hands with soap and water. Dry your hands with a clean towel. If you cannot use soap and water, use hand sanitizer. Putting on the dressing  If your doctor recommended a skin protectant, put it on the skin around the wound.  Gently pack the wound if told by your doctor.  Cover the wound with the recommended dressing. Make sure to touch only the outside edges of the dressing. Do not touch the inside of the dressing.  Attach the dressing so all sides stay in place. You may do this with medical adhesive, roll gauze, or tape. If you use tape, do not wrap the tape all the way around your arm or leg.  Take off your gloves. Put them in the trash bag with the old dressing. Tie the bag shut and throw it away.  Wash your hands with soap and water. Dry your hands with a clean towel. If you cannot use soap and water, use hand sanitizer. Follow these instructions at home: Wound care   Check your wound every day for signs of infection, or as often as told by your doctor. Check for: ? More redness, swelling, or pain. ? More fluid or blood. ? Warmth. ? Pus or a bad  smell. General instructions  Take over-the-counter and prescription medicines only as told by your doctor.  Ask your doctor if the medicine prescribed to you: ? Requires you to avoid driving or using heavy machinery. ? Can cause trouble pooping (constipation). You may need to take steps to prevent or treat trouble pooping:  Drink enough fluid to keep your pee (urine) pale yellow.  Take over-the-counter or prescription medicines.  Eat foods that are high in fiber. These include beans, whole grains, and fresh fruits and vegetables.  Limit foods that are high in fat and sugar. These include fried or sweet foods.  Keep all follow-up visits as told by  your doctor. This is important. Contact a doctor if:  You have new pain.  You have irritation, a rash, or itching around the wound or dressing.  It hurts to change your dressing.  Changing your dressing causes a lot of bleeding. Get help right away if:  You have very bad pain.  You have signs of infection, such as: ? More redness, swelling, or pain. ? More fluid or blood. ? Warmth. ? Pus or a bad smell. ? Red streaks leading from the wound. ? A fever. Summary  A dressing is a material that is placed in and over a wound.  A dressing helps your wound heal.  Wash your hands before and after each dressing change.  Follow your doctor's instructions about how to care for your wound.  Check your wound for signs of infection. This information is not intended to replace advice given to you by your health care provider. Make sure you discuss any questions you have with your health care provider. Document Revised: 10/16/2017 Document Reviewed: 10/16/2017 Elsevier Patient Education  [place].

## 2020-01-03 ENCOUNTER — Encounter: Payer: 59 | Admitting: Gynecologic Oncology

## 2020-01-03 ENCOUNTER — Telehealth: Payer: Self-pay

## 2020-01-03 LAB — ANAEROBIC CULTURE

## 2020-01-03 LAB — AEROBIC CULTURE W GRAM STAIN (SUPERFICIAL SPECIMEN): Culture: NO GROWTH

## 2020-01-03 NOTE — Telephone Encounter (Signed)
TC to patient following up on wound.  Patient stated she is feeling much better and blood sugars have improved since visit 12/28/2019.  Patient stated husband is packing wound as instructed without difficulty. Ms. Weimer denied fevers, no discharge or redness. Wound is "a little tender" but attributes this to procedure on 12/28/2019.  Reviewed s/s of infection with patient.  Patient will keep appointment 01/07/2020 and will call with any questions or concerns.

## 2020-01-07 ENCOUNTER — Encounter: Payer: Self-pay | Admitting: Gynecologic Oncology

## 2020-01-07 ENCOUNTER — Inpatient Hospital Stay: Payer: 59 | Attending: Gynecologic Oncology | Admitting: Gynecologic Oncology

## 2020-01-07 ENCOUNTER — Other Ambulatory Visit: Payer: Self-pay

## 2020-01-07 ENCOUNTER — Other Ambulatory Visit: Payer: 59

## 2020-01-07 VITALS — BP 107/72 | HR 80 | Temp 97.2°F | Resp 16 | Ht 64.0 in | Wt 151.2 lb

## 2020-01-07 DIAGNOSIS — Z7189 Other specified counseling: Secondary | ICD-10-CM

## 2020-01-07 DIAGNOSIS — Z90710 Acquired absence of both cervix and uterus: Secondary | ICD-10-CM

## 2020-01-07 DIAGNOSIS — Z90722 Acquired absence of ovaries, bilateral: Secondary | ICD-10-CM | POA: Diagnosis not present

## 2020-01-07 DIAGNOSIS — C541 Malignant neoplasm of endometrium: Secondary | ICD-10-CM | POA: Insufficient documentation

## 2020-01-07 NOTE — Patient Instructions (Signed)
  The cancer was a stage IA endometrial cancer and no additional treatment is recommended at this time.    Dr Denman George is recommending avoidance of intercourse for another 5 weeks. It is reasonable to return to heavy lifting and working out in the gym in 1-2 weeks.  Please return for 6 monthly checks with Dr Berline Lopes and Dr [name].  Please contact Dr Charisse March office (at [phone]) in January to request an appointment with her for May, 2022.

## 2020-01-07 NOTE — Progress Notes (Signed)
Gynecologic Oncology Follow-up  Chief Complaint: endometrial cancer  Treatment History: Oncology History  Endometrial adenocarcinoma ([place])  10/05/2019 Imaging   Pelvic US: Uterus 11.6 x 7.3 x 8.3 cm with an endometrial lining measuring 1.5 cm.  Several uterine fibroids noted, the largest measuring 6.4 x 5.8 cm.   11/18/2019 Initial Biopsy   D&C: Endometrial adenocarcinoma, FIGO grade 1   11/24/2019 Initial Diagnosis   Endometrial adenocarcinoma ([place])   12/13/2019 Surgery   Robotic-assisted laparoscopic total hysterectomy >250gm with bilateral salpingoophorectomy, SLN biopsy, minilaparotomy  Findings: enlarged 500+gm uterus, normal tubes and ovaries, no gross extratuerine disease, no apparently suspicious lymph nodes.    12/13/2019 Pathology Results   A. SENTINEL LYMPH NODE, RIGHT EXTERNAL ILIAC, EXCISION:  - One lymph node with no metastatic carcinoma (0/1).   B. SENTINEL LYMPH NODE, LEFT EXTERNAL ILIAC, EXCISION:  - One lymph node with no metastatic carcinoma (0/1).   C. UTERUS, CERVIX, BILATERAL FALLOPIAN TUBES AND OVARIES:  - Endometrium:       Microscopic focus of endometrioid adenocarcinoma.       Inactive endometrium and extensive biopsy changes.       No myometrial invasion.       See oncology table.  - Cervix:       Slight cervicitis and nabothian cyst.       No dysplasia or evidence of malignancy.  - Myometrium:       Leiomyomata.       No evidence of malignancy.  - Right and left ovaries:       Benign serous cysts.       No endometriosis or evidence of malignancy.  - Right and left fallopian tubes:       Benign paratubal cyst.       No endometriosis or evidence of malignancy.   ONCOLOGY TABLE:  UTERUS, CARCINOMA OR CARCINOSARCOMA  Procedure: Total hysterectomy and bilateral salpingo-oophorectomy.  Histologic type: Endometrioid.  Histologic Grade: FIGO grade 1.  Myometrial invasion:       Depth of invasion: 0 mm       Myometrial thickness: 15 mm  Uterine  Serosa Involvement: Not present.  Cervical stromal involvement: Not present.  Extent of involvement of other organs: Not present.  Lymphovascular invasion: Not present.  Regional Lymph Nodes:       Examined:     2 Sentinel                               0 non-sentinel                               2 total        Lymph nodes with metastasis: 0        Isolated tumor cells (<0.2 mm): 0        Micrometastasis:  (>0.2 mm and < 2.0 mm): 0        Macrometastasis: (>2.0 mm): 0       Representative Tumor Block: C7  MMR / MSI testing: Insufficient residual tumor.  Pathologic Stage Classification (pTNM, AJCC 8th edition):  pT1a, pN0  Comments: The endometrium shows extensive biopsy changes and there is  focal inactive endometrium.  There is a microscopic focus of cribriform  glands with atypia consistent with minimal residual FIGO grade 1  endometrioid carcinoma.    12/13/2019 Cancer Staging   Staging form: Corpus Uteri - Carcinoma and  Carcinosarcoma, AJCC 8th Edition - Clinical stage from 12/13/2019: FIGO Stage IA (cT1a, cN0(sn), cM0) - Signed by [name], MD on 12/21/2019     Interval History: Patient reported undergoing regular wet-to-dry packing for approximately 3 days after the I&D.  However the opening then closed up and she had no further admission of fluid from the cavity.  Her blood glucose is significantly improved after incision and drainage and her general malaise also improved.  She had no concerning ongoing symptoms no vaginal bleeding.  She had discussed results previously with Dr. Berline Lopes that had revealed a low risk grade 1 stage Ia endometrioid endometrial adenocarcinoma.  No adjuvant therapy was recommended.  MMR testing was not performed on the hysterectomy specimen due to insufficient residual tumor.  Past Medical/Surgical History: Past Medical History:  Diagnosis Date  . Diabetes mellitus without complication ([place])   . Endometrial adenocarcinoma ([place])   .  Hyperlipidemia   . Hypertension   . Hypothyroidism   . Iron deficiency anemia 01/17/2011  . Menopause   . Peripheral vascular disease ([name]) 1990   stripping   . Thyroid disease   . Type II or unspecified type diabetes mellitus without mention of complication, not stated as uncontrolled 02/04/2013  . Varicose veins   . Vitamin D deficiency     Past Surgical History:  Procedure Laterality Date  . ENDOMETRIAL ABLATION W/ NOVASURE  2015   Dr. [name]  . ROBOTIC ASSISTED TOTAL HYSTERECTOMY WITH BILATERAL SALPINGO OOPHERECTOMY Bilateral 12/13/2019   Procedure: XI ROBOTIC ASSISTED TOTAL HYSTERECTOMY WITH BILATERAL SALPINGO OOPHORECTOMY (SPECIMEN WEIGHING >250 GRAMS), MINI LAPAROTOMY;  Surgeon: Everitt Amber, MD;  Location: [facility];  Service: Gynecology;  Laterality: Bilateral;  . SENTINEL NODE BIOPSY N/A 12/13/2019   Procedure: SENTINEL LYMPH NODE BIOPSY;  Surgeon: Everitt Amber, MD;  Location: [facility];  Service: Gynecology;  Laterality: N/A;  . VEIN LIGATION AND STRIPPING Left 2017  . VEIN LIGATION AND STRIPPING Right 2017    Family History  Problem Relation Age of Onset  . Diabetes Mother   . Fibromyalgia Mother   . Arrhythmia Mother        Required chest compressions -  . Osteoporosis Mother   . Thyroid disease Father        hypothyroid  . Heart attack Father 88  . Thyroid disease Sister   . Osteoporosis Sister   . Anxiety disorder Daughter   . Colitis Daughter   . Depression Son   . Irritable bowel syndrome Son   . Breast cancer Maternal Grandmother 80    Social History   Socioeconomic History  . Marital status: Married    Spouse name: Not on file  . Number of children: 2  . Years of education: 79  . Highest education level: High school graduate  Occupational History  . Not on file  Tobacco Use  . Smoking status: Former Smoker    Packs/day: 1.00    Years: 20.00    Pack years: 20.00    Types: Cigarettes    Start date: 03/21/1991     Quit date: 03/20/2010    Years since quitting: 9.8  . Smokeless tobacco: Never Used  Vaping Use  . Vaping Use: Never used  Substance and Sexual Activity  . Alcohol use: Yes    Alcohol/week: 2.0 standard drinks    Types: 2 Standard drinks or equivalent per week    Comment: 2 glasses a wine once a week  . Drug use: No  .  Sexual activity: Yes    Partners: Male    Birth control/protection: Surgical, Post-menopausal  Other Topics Concern  . Not on file  Social History Narrative  . Not on file   Social Determinants of Health   Financial Resource Strain:   . Difficulty of Paying Living Expenses: Not on file  Food Insecurity:   . Worried About Charity fundraiser in the Last Year: Not on file  . Ran Out of Food in the Last Year: Not on file  Transportation Needs:   . Lack of Transportation (Medical): Not on file  . Lack of Transportation (Non-Medical): Not on file  Physical Activity:   . Days of Exercise per Week: Not on file  . Minutes of Exercise per Session: Not on file  Stress:   . Feeling of Stress : Not on file  Social Connections:   . Frequency of Communication with Friends and Family: Not on file  . Frequency of Social Gatherings with Friends and Family: Not on file  . Attends Religious Services: Not on file  . Active Member of Clubs or Organizations: Not on file  . Attends Archivist Meetings: Not on file  . Marital Status: Not on file    Current Medications:  Current Outpatient Medications:  .  acetaminophen (TYLENOL) 500 MG tablet, Take 500 mg by mouth every 6 (six) hours as needed for mild pain., Disp: , Rfl:  .  aspirin EC 81 MG tablet, Take 1 tablet (81 mg total) by mouth daily. Do not take for one week post-op. Swallow whole., Disp: 30 tablet, Rfl: 11 .  atorvastatin (LIPITOR) 80 MG tablet, Take      1 tablet      Daily      for Cholesterol (Patient taking differently: Take 40 mg by mouth in the morning and at bedtime. Take      1 tablet      Daily       for Cholesterol), Disp: 90 tablet, Rfl: 0 .  Calcium Citrate-Vitamin D (CALCIUM CITRATE + D3 PO), Take 600 mg by mouth daily. , Disp: , Rfl:  .  Cholecalciferol 125 MCG (5000 UT) TABS, Take 5,000 Units by mouth daily., Disp: , Rfl:  .  Continuous Blood Gluc Sensor (FREESTYLE LIBRE 2 SENSOR) MISC, Inject 1 sensor to the skin every 14 days for continuous glucose monitoring., Disp: , Rfl:  .  ferrous fumarate (HEMOCYTE - 106 MG FE) 325 (106 FE) MG TABS, Take 1 tablet by mouth daily., Disp: , Rfl:  .  ibuprofen (ADVIL) 800 MG tablet, Take 1 tablet (800 mg total) by mouth every 8 (eight) hours as needed for moderate pain. For AFTER surgery (Patient taking differently: Take 400 mg by mouth every 8 (eight) hours as needed for moderate pain. For AFTER surgery), Disp: 30 tablet, Rfl: 0 .  insulin glargine (LANTUS) 100 UNIT/ML Solostar Pen, Inject 10-16 Units into the skin daily. , Disp: , Rfl:  .  insulin lispro (HUMALOG KWIKPEN) 100 UNIT/ML KwikPen, Inject 0-4 Units into the skin 3 (three) times daily before meals. Sliding scale: under 120 = 0 units  120 - 170 = 1 units  171 - 220 = 2 units  221 - 270 = 3 units  271 - 320 = 4 units  over 320 = 4 units, Disp: , Rfl:  .  levothyroxine (SYNTHROID) 137 MCG tablet, Take 1 tablet (137 mcg total) by mouth daily before breakfast., Disp: 90 tablet, Rfl: 3 .  lisinopril (  ZESTRIL) 10 MG tablet, Take      1 tablet       Daily       for BP & Diabetic Kidney Protection, Disp: 90 tablet, Rfl: 0 .  Magnesium 500 MG CAPS, Take 1,000 mg by mouth daily. , Disp: , Rfl:  .  Multiple Vitamins-Minerals (MULTIVITAMIN WITH MINERALS) tablet, Take 1 tablet by mouth daily., Disp: , Rfl:  .  Omega-3 Fatty Acids (FISH OIL) 1200 MG CAPS, Take 1 capsule (1,200 mg total) by mouth 2 (two) times daily. (Patient taking differently: Take 1,200 mg by mouth daily. ), Disp: , Rfl:  .  senna-docusate (SENOKOT-S) 8.6-50 MG tablet, Take 2 tablets by mouth at bedtime. For AFTER surgery, do not take if  having diarrhea, Disp: 30 tablet, Rfl: 0  Review of Symptoms: Pertinent positives as per HPI, otherwise ROS negative.  Physical Exam: BP 107/72 (BP Location: Left Arm, Patient Position: Sitting)   Pulse 80   Temp (!) 97.2 F (36.2 C) (Tympanic)   Resp 16   Ht _0  (1.626 m)   Wt 151 lb 3.2 oz (68.6 kg)   LMP 08/11/2013   SpO2 97%   BMI 25.95 kg/m   WD in NAD Neck  Supple NROM, without any enlargements.  Lymph Node Survey No cervical supraclavicular or inguinal adenopathy Cardiovascular  Well perfused peripheries Lungs  No increased WOB Skin  No rash/lesions/breakdown  Psychiatry  Alert and oriented to person, place, and time  Abdomen  Normoactive bowel sounds, abdomen soft, non-tender and nonobese without evidence of hernia.  The site of the incision and drainage has fibrinous change at the opening but no palpable underlying fluctuance.  With expression of the indurated area under the incision there is no fluid that limits.  This is consistent with a healing abscess space.  No overlying erythema minimal tenderness. Genito Urinary  Vulva/vagina: Normal external female genitalia.   No lesions. No discharge or bleeding.  Bladder/urethra:  No lesions or masses, well supported bladder  Vagina: vaginal cuff with suture material present but no gapping or defects in the cuff. . Rectal  deferred Extremities  No bilateral cyanosis, clubbing or edema.   Assessment & Plan: YARIANA HOAGLUND is a 64 y.o. woman with Stage IA grade 1 endometrioid adenocarcinoma (MMR pending) who presents for follow-up and cancer counseling.  Her postop course was complicated by development of right abdominal wall abscess which underwent incision and drainage and oral antibiotics.  Pathology revealed low risk factors for recurrence, therefore no adjuvant therapy is recommended according to NCCN guidelines.  I discussed risk for recurrence and typical symptoms encouraged her to notify us of these should  they develop between visits.  I recommend she have follow-up every 6 months for 5 years in accordance with NCCN guidelines. Those visits should include symptom assessment, physical exam and pelvic examination. Pap smears are not indicated or recommended in the routine surveillance of endometrial cancer.  With respect to her abdominal wall abscess, this has now healed and does not require further intervention.    30 minutes of direct face to face counseling time was spent with the patient. This included discussion about prognosis, therapy recommendations and postoperative side effects and are beyond the scope of routine postoperative care.  Thereasa Solo, MD

## 2020-01-31 ENCOUNTER — Telehealth: Payer: Self-pay

## 2020-01-31 NOTE — Telephone Encounter (Signed)
TC from patient to ask if she can have colonoscopy.  Per Dr. Berline Lopes, patient is ok to proceed with colonoscopy.

## 2020-01-31 NOTE — Telephone Encounter (Signed)
Requesting a referral for a specific location for colonoscopy. Please advise.

## 2020-02-01 NOTE — Telephone Encounter (Signed)
Left message on voice mail  to call back

## 2020-02-04 NOTE — Telephone Encounter (Signed)
Patient was trying to find someone to do the Colonoscopy before the end of the year. Last Colonoscopy was in 2010. States that she will call previous doctors office. Musician)

## 2020-03-24 ENCOUNTER — Other Ambulatory Visit: Payer: Self-pay | Admitting: Internal Medicine

## 2020-03-24 ENCOUNTER — Telehealth: Payer: Self-pay | Admitting: *Deleted

## 2020-03-24 NOTE — Telephone Encounter (Signed)
Returned the patient's call and scheduled a follow up appt for May 

## 2020-05-22 ENCOUNTER — Encounter: Payer: 59 | Admitting: Adult Health Nurse Practitioner

## 2020-05-22 NOTE — Progress Notes (Signed)
Complete Physical  Assessment and Plan:  Diagnoses and all orders for this visit:  Encounter for general adult medical examination with abnormal findings Due annually   Essential hypertension At goal; continue medication Monitor blood pressure at home; call if consistently over 130/80 Continue DASH diet.   Reminder to go to the ER if any CP, SOB, nausea, dizziness, severe HA, changes vision/speech, left arm numbness and tingling and jaw pain. -     EKG 12-Lead  Varicose veins of lower extremities with complications, unspecified laterality S/p vein ligation and stripping; recommended wearing support hose; follow up vascular as needed -  Symptoms stable and well managed  Postablative hypothyroidism continue medications the same pending lab results reminded to take on an empty stomach 30-7mins before food.  check TSH level -     TSH  LADA (latent autoimmune diabetes in adults), managed as type 1 ([place]) Followed by Dr. [name] Education: Reviewed 'ABCs' of diabetes management (respective goals in parentheses):  A1C (<7), blood pressure (<130/80), and cholesterol (LDL <70) Eye Exam yearly and Dental Exam every 6 months; reminded needs this year and to send report Dietary recommendations reviewed Physical Activity recommendations reviewed Foot exam performed -     Hemoglobin J6R  Systolic murmur Mild, intermittent, benign characteristics; not heard today; monitor   Medication management -     CBC with Differential/Platelet -     CMP/GFR -     Magnesium   Mixed hyperlipidemia Continue medications: atorvastatin Continue low cholesterol diet and exercise.  Check lipid panel.  -     Lipid panel  Vitamin D deficiency At goal at recent check; continue to recommend supplementation for goal of 70-100 Check vitamin D level  BMI 23 adult Continue to recommend diet heavy in fruits and veggies and low in animal meats, cheeses, and dairy products, appropriate calorie  intake Discuss exercise recommendations routinely Continue to monitor weight at each visit  Screening for hematuria or proteinuria -     Microalbumin / creatinine urine ratio -     Urinalysis w microscopic + reflex cultur  Need for shingles vaccine Will get at pharmacy this year  Colon cancer screening  GI referral placed, agreeable to pursue this year  Endometrial adenocarcinoma [facility]) S/p TAH, plan to monitor q56m; will see Dr. Kathrine Haddock. [name] alternating; no adjunct therapy was recommended.   Anisocoria  ? New finding, follows ophthalmology annually, ? R/t previously noted mild glaucoma in 2020; recent report requested; otherwise benign exam and history  Orders Placed This Encounter  Procedures  . CBC with Differential/Platelet  . COMPLETE METABOLIC PANEL WITH GFR  . Magnesium  . Lipid panel  . TSH  . Hemoglobin A1c  . VITAMIN D 25 Hydroxy (Vit-D Deficiency, Fractures)  . Iron, TIBC and Ferritin Panel  . Microalbumin / creatinine urine ratio  . Urinalysis, Routine w reflex microscopic  . Ambulatory referral to Gastroenterology  . EKG 12-Lead  . HM DIABETES FOOT EXAM    Discussed med's effects and SE's. Screening labs and tests as requested with regular follow-up as recommended. Over 40 minutes of exam, counseling, chart review, and complex, high level critical decision making was performed this visit.   Future Appointments  Date Time Provider [place]  07/25/2020  1:15 PM [name], MD [facility] None  12/20/2020  9:30 AM [name], NP [facility] None  05/24/2021 10:00 AM [name], NP [facility] None     HPI  65 y.o. very pleasant married Caucasian female, presents for a  complete physical and follow up for has Essential hypertension; Hyperlipidemia associated with type 2 diabetes mellitus ([place]); LADA (latent autoimmune diabetes in adults), managed as type 1 ([place]); Hypothyroidism; Vitamin D deficiency; Medication management; Varicose veins  of lower extremities with complications; Systolic murmur; CKD stage 2 due to type 2 diabetes mellitus ([place]); Osteopenia; and Endometrial adenocarcinoma ([place]) on their problem list.   She is married, recently retired from [employer], mother of 2, 4 grand kids in [place] and [place].   No concerns today.  Followed by Dr. [name] for GYN and mammograms.   She was found to have endometrial cancer in 2021 and on 12/13/2019 underwent robotic assisted hysterectomy with bilateral salpingooopherecomy with sentinel lymph node biopsy by Dr. Everitt Amber. Pathology revealed a low risk grade 1 stage Ia endometrial adenocarcinoma.  No adjuvant therapy was recommended. Plan to follow up q23m x 5 year, alternating with Dr. [name] and Dr. Denman George.   BMI is Body mass index is 23.5 kg/m., she has been working on diet and exercise. Works out for an hour most days.  Has been following 1500 kcal diet, general healthy.  She drinks 2 glasses of wine weekly.  She drinks 1/2+ gallon of water daily. She drinks 1 cups of coffee daily, switched to decaf  Very rarely will drink soda.  Wt Readings from Last 3 Encounters:  05/23/20 138 lb (62.6 kg)  01/07/20 151 lb 3.2 oz (68.6 kg)  12/31/19 150 lb (68 kg)   Her blood pressure has been controlled at home, today their BP is BP: 110/70  She does workout. She denies chest pain, shortness of breath, dizziness.   She is on cholesterol medication (atorvastatin 80 mg daily) and denies myalgias. Her cholesterol is at goal. The cholesterol last visit was:   Lab Results  Component Value Date   CHOL 162 05/21/2019   HDL 77 05/21/2019   LDLCALC 71 05/21/2019   TRIG 60 05/21/2019   CHOLHDL 2.1 05/21/2019   She is [name] for LADA as she was found to have elevated insulin antibodies.  Patient take a base of 13 units of glargine daily and covers bid with small doses (1 unit) of humalog if glucose is >120 (which apparently is infrequent, typically once a week or  so). 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  She has been working on diet and exercise for LADA diabetes. she is on bASA, she is on ACE/ARB and denies foot ulcerations, hyperglycemia, increased appetite, nausea, paresthesia of the feet, polydipsia, polyuria, visual disturbances, vomiting and weight loss. Last A1C in the office was:  Lab Results  Component Value Date   HGBA1C 6.4 (H) 05/21/2019   She is on thyroid medication. Her medication was not changed last visit.   Lab Results  Component Value Date   TSH 0.48 05/21/2019   She has CKD 2 monitored at this office:  Lab Results  Component Value Date   GFRNONAA >60 12/26/2019   Patient is on Vitamin D supplement and at goal at recent check:    Lab Results  Component Value Date   VD25OH 65 05/21/2019        Current Medications:  Current Outpatient Medications on File Prior to Visit  Medication Sig Dispense Refill  . acetaminophen (TYLENOL) 500 MG tablet Take 500 mg by mouth every 6 (six) hours as needed for mild pain.    Marland Kitchen aspirin EC 81 MG tablet Take 1 tablet (81 mg total)  by mouth daily. Do not take for one week post-op. Swallow whole. 30 tablet 11  . atorvastatin (LIPITOR) 80 MG tablet TAKE 1 TABLET BY MOUTH DAILY FOR CHOLESTEROL 90 tablet 33  . Calcium Citrate-Vitamin D (CALCIUM CITRATE + D3 PO) Take 600 mg by mouth daily.     . Cholecalciferol 125 MCG (5000 UT) TABS Take 5,000 Units by mouth daily.    . Continuous Blood Gluc Sensor (FREESTYLE LIBRE 2 SENSOR) MISC Inject 1 sensor to the skin every 14 days for continuous glucose monitoring.    . ferrous fumarate (HEMOCYTE - 106 MG FE) 325 (106 FE) MG TABS Take 1 tablet by mouth daily.    Marland Kitchen ibuprofen (ADVIL) 800 MG tablet Take 1 tablet (800 mg total) by mouth every 8 (eight) hours as needed for moderate pain. For AFTER surgery (Patient taking differently: Take 400 mg by mouth every 8 (eight) hours as needed for moderate pain. For AFTER surgery) 30 tablet 0   . insulin glargine (LANTUS) 100 UNIT/ML Solostar Pen Inject 10-16 Units into the skin daily.     . insulin lispro (HUMALOG KWIKPEN) 100 UNIT/ML KwikPen Inject 0-4 Units into the skin 3 (three) times daily before meals. Sliding scale: under 120 = 0 units  120 - 170 = 1 units  171 - 220 = 2 units  221 - 270 = 3 units  271 - 320 = 4 units  over 320 = 4 units    . levothyroxine (SYNTHROID) 137 MCG tablet Take 1 tablet (137 mcg total) by mouth daily before breakfast. 90 tablet 3  . lisinopril (ZESTRIL) 10 MG tablet Take      1 tablet       Daily       for BP & Diabetic Kidney Protection 90 tablet 0  . Magnesium 500 MG CAPS Take 1,000 mg by mouth daily.     . Multiple Vitamins-Minerals (MULTIVITAMIN WITH MINERALS) tablet Take 1 tablet by mouth daily.    . Omega-3 Fatty Acids (FISH OIL) 1200 MG CAPS Take 1 capsule (1,200 mg total) by mouth 2 (two) times daily. (Patient taking differently: Take 1,200 mg by mouth daily.)    . senna-docusate (SENOKOT-S) 8.6-50 MG tablet Take 2 tablets by mouth at bedtime. For AFTER surgery, do not take if having diarrhea 30 tablet 0   No current facility-administered medications on file prior to visit.   Allergies:  No Known Allergies Medical History:  She has Essential hypertension; Hyperlipidemia associated with type 2 diabetes mellitus ([place]); LADA (latent autoimmune diabetes in adults), managed as type 1 ([place]); Hypothyroidism; Vitamin D deficiency; Medication management; Varicose veins of lower extremities with complications; Systolic murmur; CKD stage 2 due to type 2 diabetes mellitus ([place]); Osteopenia; and Endometrial adenocarcinoma ([place]) on their problem list. Health Maintenance:   Immunization History  Administered Date(s) Administered  . Influenza Inj Mdck Quad With Preservative 12/11/2017  . Influenza Split 12/06/2013, 12/08/2014  . Influenza Whole 01/28/2012  . Influenza-Unspecified 12/04/2015, 12/17/2016, 12/03/2018  . Moderna Sars-Covid-2 Vaccination  05/09/2020  . PFIZER(Purple Top)SARS-COV-2 Vaccination 05/27/2019, 06/17/2019  . PPD Test 02/08/2014, 03/15/2015, 04/05/2016  . Pneumococcal Polysaccharide-23 12/08/2014  . Td 04/05/2016  . Tdap 03/04/2006    Tetanus: 2018 Pneumovax: 2016 Prevnar 13: - Flu vaccine: 04/2019 Shingrix: n/a in office, will get at pharmacy this year PPD: 2018' Covid 19: 3/3, 2021, pfizer  LMP: Postmenopausal Pap: 11/2019 - with Dr. [name], now s/p TAH MGM: 11/2019 at Dr. [name] DEXA: 2020, osteopenia, spine T-2.0 at GYN (  Dr. [name]) Colonoscopy: 2010 - DUE - referred back 2020, never got, refer back today EGD: -  Last Dental Exam: Dr. Radford Pax, 2021 - q 6 months Last Eye Exam: [facility], 2021 - report requested  Foot exam: Done today   Patient Care Team: Unk Pinto, MD as PCP - General  Surgical History:  She has a past surgical history that includes Vein ligation and stripping (Left, 2017); Vein ligation and stripping (Right, 2017); Endometrial ablation w/ novasure (2015); Robotic assisted total hysterectomy with bilateral salpingo oophorectomy (Bilateral, 12/13/2019); and Sentinel node biopsy (N/A, 12/13/2019). Family History:  Herfamily history includes Anxiety disorder in her daughter; Arrhythmia in her mother; Breast cancer (age of onset: 51) in her maternal grandmother; Colitis in her daughter; Depression in her son; Diabetes in her mother; Fibromyalgia in her mother; Heart attack (age of onset: 70) in her father; Irritable bowel syndrome in her son; Osteoporosis in her mother and sister; Thyroid disease in her father and sister. Social History:  She reports that she quit smoking about 10 years ago. Her smoking use included cigarettes. She started smoking about 29 years ago. She has a 20.00 pack-year smoking history. She has never used smokeless tobacco. She reports current alcohol use of about 2.0 standard drinks of alcohol per week. She reports that she does not use drugs.  Review of  Systems: Review of Systems  Constitutional: Negative for malaise/fatigue and weight loss.  HENT: Negative for hearing loss and tinnitus.   Eyes: Negative for blurred vision and double vision.  Respiratory: Negative for cough, shortness of breath and wheezing.   Cardiovascular: Negative for chest pain, palpitations, orthopnea, claudication and leg swelling.  Gastrointestinal: Negative for abdominal pain, blood in stool, constipation, diarrhea, heartburn, melena, nausea and vomiting.  Genitourinary: Negative.   Musculoskeletal: Negative for joint pain and myalgias.  Skin: Negative for rash.  Neurological: Negative for dizziness, tingling, sensory change, weakness and headaches.  Endo/Heme/Allergies: Negative for polydipsia.  Psychiatric/Behavioral: Negative.   All other systems reviewed and are negative.   Physical Exam: Estimated body mass index is 23.5 kg/m as calculated from the following:   Height as of this encounter: 5' 4.25" (1.632 m).   Weight as of this encounter: 138 lb (62.6 kg). BP 110/70   Pulse (!) 57   Temp (!) 97.3 F (36.3 C)   Ht 5' 4.25" (1.632 m)   Wt 138 lb (62.6 kg)   LMP 08/11/2013   SpO2 99%   BMI 23.50 kg/m  General Appearance: Well nourished, in no apparent distress.  Eyes: L pupil > R, otherwise similarly reactive and accommodates, EOMs symmetrical, conjunctiva no swelling or erythema Sinuses: No Frontal/maxillary tenderness  ENT/Mouth: Ext aud canals clear, normal light reflex with TMs without erythema, bulging. Good dentition. No erythema, swelling, or exudate on post pharynx. Tonsils not swollen or erythematous. Hearing normal.  Neck: Supple, thyroid normal. No bruits  Respiratory: Respiratory effort normal, BS equal bilaterally without rales, rhonchi, wheezing or stridor.  Cardio: RRR without murmur, no rubs or gallops. Brisk peripheral pulses without edema.  Chest: symmetric, with normal excursions and percussion.  Breasts: Defer to GYN  Abdomen:  Soft, nontender, no guarding, rebound, hernias, masses, or organomegaly. Well healed surgical scars.  Lymphatics: Non tender without lymphadenopathy.  Genitourinary: Defer to GYN Musculoskeletal: Full ROM all peripheral extremities,5/5 strength, and normal gait.  Skin: Warm, dry without rashes, lesions, ecchymosis. She has multiple very small 1-2 mm uniform non-raised nevi to back and upper extremities. Neuro: Cranial nerves  intact, reflexes equal bilaterally. Normal muscle tone, no cerebellar symptoms. Sensation intact bil to monofilament 10/10  Psych: Awake and oriented X 3, normal affect, Insight and Judgment appropriate.   EKG: Sinus bradycardia, NSCPT   Gorden Harms Gladyce Mcray 1:19 PM [facility]

## 2020-05-23 ENCOUNTER — Encounter: Payer: Self-pay | Admitting: Adult Health

## 2020-05-23 ENCOUNTER — Other Ambulatory Visit: Payer: Self-pay

## 2020-05-23 ENCOUNTER — Ambulatory Visit: Payer: 59 | Admitting: Adult Health

## 2020-05-23 VITALS — BP 110/70 | HR 57 | Temp 97.3°F | Ht 64.25 in | Wt 138.0 lb

## 2020-05-23 DIAGNOSIS — E785 Hyperlipidemia, unspecified: Secondary | ICD-10-CM

## 2020-05-23 DIAGNOSIS — Z860100 Personal history of colon polyps, unspecified: Secondary | ICD-10-CM

## 2020-05-23 DIAGNOSIS — D509 Iron deficiency anemia, unspecified: Secondary | ICD-10-CM

## 2020-05-23 DIAGNOSIS — E1122 Type 2 diabetes mellitus with diabetic chronic kidney disease: Secondary | ICD-10-CM

## 2020-05-23 DIAGNOSIS — I10 Essential (primary) hypertension: Secondary | ICD-10-CM | POA: Diagnosis not present

## 2020-05-23 DIAGNOSIS — R011 Cardiac murmur, unspecified: Secondary | ICD-10-CM

## 2020-05-23 DIAGNOSIS — Z136 Encounter for screening for cardiovascular disorders: Secondary | ICD-10-CM | POA: Diagnosis not present

## 2020-05-23 DIAGNOSIS — H5702 Anisocoria: Secondary | ICD-10-CM

## 2020-05-23 DIAGNOSIS — I83899 Varicose veins of unspecified lower extremities with other complications: Secondary | ICD-10-CM

## 2020-05-23 DIAGNOSIS — Z79899 Other long term (current) drug therapy: Secondary | ICD-10-CM

## 2020-05-23 DIAGNOSIS — Z0000 Encounter for general adult medical examination without abnormal findings: Secondary | ICD-10-CM

## 2020-05-23 DIAGNOSIS — M8588 Other specified disorders of bone density and structure, other site: Secondary | ICD-10-CM

## 2020-05-23 DIAGNOSIS — E139 Other specified diabetes mellitus without complications: Secondary | ICD-10-CM

## 2020-05-23 DIAGNOSIS — Z6823 Body mass index (BMI) 23.0-23.9, adult: Secondary | ICD-10-CM

## 2020-05-23 DIAGNOSIS — Z6825 Body mass index (BMI) 25.0-25.9, adult: Secondary | ICD-10-CM

## 2020-05-23 DIAGNOSIS — Z1389 Encounter for screening for other disorder: Secondary | ICD-10-CM

## 2020-05-23 DIAGNOSIS — E1169 Type 2 diabetes mellitus with other specified complication: Secondary | ICD-10-CM

## 2020-05-23 DIAGNOSIS — E890 Postprocedural hypothyroidism: Secondary | ICD-10-CM

## 2020-05-23 DIAGNOSIS — E559 Vitamin D deficiency, unspecified: Secondary | ICD-10-CM

## 2020-05-23 DIAGNOSIS — C541 Malignant neoplasm of endometrium: Secondary | ICD-10-CM

## 2020-05-23 NOTE — Patient Instructions (Addendum)
Mary Frank , Thank you for taking time to come for your Annual Wellness Visit. I appreciate your ongoing commitment to your health goals. Please review the following plan we discussed and let me know if I can assist you in the future.   These are the goals we discussed: Goals    . HEMOGLOBIN A1C < 7.0    . LDL CALC < 70       This is a list of the screening recommended for you and due dates:  Health Maintenance  Topic Date Due  . Colon Cancer Screening  12/14/2018  . Pap Smear  07/05/2019  . Eye exam for diabetics  09/11/2019  . Hemoglobin A1C  11/21/2019  . Mammogram  05/02/2020  . COVID-19 Vaccine (4 - Booster) 11/09/2020  . Complete foot exam   05/23/2021  . Tetanus Vaccine  04/05/2026  . Flu Shot  Completed  . Pneumococcal vaccine  Completed  .  Hepatitis C: One time screening is recommended by Center for Disease Control  (CDC) for  adults born from [birth year] through 1963.   Completed  . HIV Screening  Completed  . HPV Vaccine  Aged Out     Recommend trying stool softener such as miralax instead of senna (more for motility) and add a daily fiber supplement   Try splitting calcium dose in 2 - 300 mg BID  Will see if we can stop iron supplement - should help reduce constipation    High-Fiber Eating Plan Fiber, also called dietary fiber, is a type of carbohydrate. It is found foods such as fruits, vegetables, whole grains, and beans. A high-fiber diet can have many health benefits. Your health care provider may recommend a high-fiber diet to help:  Prevent constipation. Fiber can make your bowel movements more regular.  Lower your cholesterol.  Relieve the following conditions: ? Inflammation of veins in the anus (hemorrhoids). ? Inflammation of specific areas of the digestive tract (uncomplicated diverticulosis). ? A problem of the large intestine, also called the colon, that sometimes causes pain and diarrhea (irritable bowel syndrome, or IBS).  Prevent overeating as  part of a weight-loss plan.  Prevent heart disease, type 2 diabetes, and certain cancers. What are tips for following this plan? Reading food labels  Check the nutrition facts label on food products for the amount of dietary fiber. Choose foods that have 5 grams of fiber or more per serving.  The goals for recommended daily fiber intake include: ? Men (age 68 or younger): 34-38 g. ? Men (over age 35): 28-34 g. ? Women (age 33 or younger): 25-28 g. ? Women (over age 57): 22-25 g. Your daily fiber goal is _____________ g.   Shopping  Choose whole fruits and vegetables instead of processed forms, such as apple juice or applesauce.  Choose a wide variety of high-fiber foods such as avocados, lentils, oats, and kidney beans.  Read the nutrition facts label of the foods you choose. Be aware of foods with added fiber. These foods often have high sugar and sodium amounts per serving. Cooking  Use whole-grain flour for baking and cooking.  Cook with brown rice instead of white rice. Meal planning  Start the day with a breakfast that is high in fiber, such as a cereal that contains 5 g of fiber or more per serving.  Eat breads and cereals that are made with whole-grain flour instead of refined flour or white flour.  Eat brown rice, bulgur wheat, or millet instead of white  rice.  Use beans in place of meat in soups, salads, and pasta dishes.  Be sure that half of the grains you eat each day are whole grains. General information  You can get the recommended daily intake of dietary fiber by: ? Eating a variety of fruits, vegetables, grains, nuts, and beans. ? Taking a fiber supplement if you are not able to take in enough fiber in your diet. It is better to get fiber through food than from a supplement.  Gradually increase how much fiber you consume. If you increase your intake of dietary fiber too quickly, you may have bloating, cramping, or gas.  Drink plenty of water to help you  digest fiber.  Choose high-fiber snacks, such as berries, raw vegetables, nuts, and popcorn. What foods should I eat? Fruits Berries. Pears. Apples. Oranges. Avocado. Prunes and raisins. Dried figs. Vegetables Sweet potatoes. Spinach. Kale. Artichokes. Cabbage. Broccoli. Cauliflower. Green peas. Carrots. Squash. Grains Whole-grain breads. Multigrain cereal. Oats and oatmeal. Brown rice. Barley. Bulgur wheat. [place]. Quinoa. Bran muffins. Popcorn. Rye wafer crackers. Meats and other proteins Navy beans, kidney beans, and pinto beans. Soybeans. Split peas. Lentils. Nuts and seeds. Dairy Fiber-fortified yogurt. Beverages Fiber-fortified soy milk. Fiber-fortified orange juice. Other foods Fiber bars. The items listed above may not be a complete list of recommended foods and beverages. Contact a dietitian for more information. What foods should I avoid? Fruits Fruit juice. Cooked, strained fruit. Vegetables Fried potatoes. Canned vegetables. Well-cooked vegetables. Grains White bread. Pasta made with refined flour. White rice. Meats and other proteins Fatty cuts of meat. Fried chicken or fried fish. Dairy Milk. Yogurt. Cream cheese. Sour cream. Fats and oils Butters. Beverages Soft drinks. Other foods Cakes and pastries. The items listed above may not be a complete list of foods and beverages to avoid. Talk with your dietitian about what choices are best for you. Summary  Fiber is a type of carbohydrate. It is found in foods such as fruits, vegetables, whole grains, and beans.  A high-fiber diet has many benefits. It can help to prevent constipation, lower blood cholesterol, aid weight loss, and reduce your risk of heart disease, diabetes, and certain cancers.  Increase your intake of fiber gradually. Increasing fiber too quickly may cause cramping, bloating, and gas. Drink plenty of water while you increase the amount of fiber you consume.  The best sources of fiber include  whole fruits and vegetables, whole grains, nuts, seeds, and beans. This information is not intended to replace advice given to you by your health care provider. Make sure you discuss any questions you have with your health care provider. Document Revised: 06/24/2019 Document Reviewed: 06/24/2019 Elsevier Patient Education  2021 [employer].

## 2020-05-24 ENCOUNTER — Other Ambulatory Visit: Payer: Self-pay | Admitting: Adult Health

## 2020-05-24 DIAGNOSIS — R748 Abnormal levels of other serum enzymes: Secondary | ICD-10-CM

## 2020-05-24 DIAGNOSIS — E890 Postprocedural hypothyroidism: Secondary | ICD-10-CM

## 2020-05-24 LAB — LIPID PANEL
Cholesterol: 137 mg/dL (ref <200)
HDL: 57 mg/dL (ref >=50)
LDL Cholesterol (Calc): 66 mg/dL (calc)
Non-HDL Cholesterol (Calc): 80 mg/dL (calc) (ref <130)
Total CHOL/HDL Ratio: 2.4 (calc) (ref <5.0)
Triglycerides: 64 mg/dL (ref <150)

## 2020-05-24 LAB — URINALYSIS, ROUTINE W REFLEX MICROSCOPIC
Bilirubin Urine: NEGATIVE
Glucose, UA: NEGATIVE
Hgb urine dipstick: NEGATIVE
Ketones, ur: NEGATIVE
Leukocytes,Ua: NEGATIVE
Nitrite: NEGATIVE
Protein, ur: NEGATIVE
Specific Gravity, Urine: 1.017 (ref 1.001–1.03)
pH: 7 (ref 5.0–8.0)

## 2020-05-24 LAB — COMPLETE METABOLIC PANEL WITH GFR
AG Ratio: 1.8 (calc) (ref 1.0–2.5)
ALT: 61 U/L — ABNORMAL HIGH (ref 6–29)
AST: 36 U/L — ABNORMAL HIGH (ref 10–35)
Albumin: 4.2 g/dL (ref 3.6–5.1)
Alkaline phosphatase (APISO): 100 U/L (ref 37–153)
BUN: 18 mg/dL (ref 7–25)
CO2: 31 mmol/L (ref 20–32)
Calcium: 9.8 mg/dL (ref 8.6–10.4)
Chloride: 106 mmol/L (ref 98–110)
Creat: 0.65 mg/dL (ref 0.50–0.99)
GFR, Est African American: 110 mL/min/{1.73_m2} (ref >=60)
GFR, Est Non African American: 95 mL/min/{1.73_m2} (ref >=60)
Globulin: 2.4 g/dL (calc) (ref 1.9–3.7)
Glucose, Bld: 75 mg/dL (ref 65–99)
Potassium: 4.3 mmol/L (ref 3.5–5.3)
Sodium: 144 mmol/L (ref 135–146)
Total Bilirubin: 0.4 mg/dL (ref 0.2–1.2)
Total Protein: 6.6 g/dL (ref 6.1–8.1)

## 2020-05-24 LAB — HEMOGLOBIN A1C
Hgb A1c MFr Bld: 6.1 % of total Hgb — ABNORMAL HIGH (ref <5.7)
Mean Plasma Glucose: 128 mg/dL
eAG (mmol/L): 7.1 mmol/L

## 2020-05-24 LAB — IRON,TIBC AND FERRITIN PANEL
%SAT: 32 % (calc) (ref 16–45)
Ferritin: 56 ng/mL (ref 16–288)
Iron: 79 ug/dL (ref 45–160)
TIBC: 245 mcg/dL (calc) — ABNORMAL LOW (ref 250–450)

## 2020-05-24 LAB — MAGNESIUM: Magnesium: 2.1 mg/dL (ref 1.5–2.5)

## 2020-05-24 LAB — CBC WITH DIFFERENTIAL/PLATELET
Absolute Monocytes: 519 cells/uL (ref 200–950)
Basophils Absolute: 57 cells/uL (ref 0–200)
Basophils Relative: 1 %
Eosinophils Absolute: 91 cells/uL (ref 15–500)
Eosinophils Relative: 1.6 %
HCT: 45.4 % — ABNORMAL HIGH (ref 35.0–45.0)
Hemoglobin: 14.6 g/dL (ref 11.7–15.5)
Lymphs Abs: 1961 cells/uL (ref 850–3900)
MCH: 27.1 pg (ref 27.0–33.0)
MCHC: 32.2 g/dL (ref 32.0–36.0)
MCV: 84.2 fL (ref 80.0–100.0)
MPV: 11.1 fL (ref 7.5–12.5)
Monocytes Relative: 9.1 %
Neutro Abs: 3072 cells/uL (ref 1500–7800)
Neutrophils Relative %: 53.9 %
Platelets: 239 10*3/uL (ref 140–400)
RBC: 5.39 10*6/uL — ABNORMAL HIGH (ref 3.80–5.10)
RDW: 12.4 % (ref 11.0–15.0)
Total Lymphocyte: 34.4 %
WBC: 5.7 10*3/uL (ref 3.8–10.8)

## 2020-05-24 LAB — MICROALBUMIN / CREATININE URINE RATIO
Creatinine, Urine: 87 mg/dL (ref 20–275)
Microalb Creat Ratio: 8 mcg/mg creat (ref <30)
Microalb, Ur: 0.7 mg/dL

## 2020-05-24 LAB — VITAMIN D 25 HYDROXY (VIT D DEFICIENCY, FRACTURES): Vit D, 25-Hydroxy: 91 ng/mL (ref 30–100)

## 2020-05-24 LAB — TSH: TSH: 0.02 mIU/L — ABNORMAL LOW (ref 0.40–4.50)

## 2020-05-24 MED ORDER — LEVOTHYROXINE SODIUM 137 MCG PO TABS: ORAL_TABLET | ORAL | 3 refills | Status: DC

## 2020-06-02 ENCOUNTER — Encounter: Payer: Self-pay | Admitting: *Deleted

## 2020-06-14 ENCOUNTER — Encounter: Payer: Self-pay | Admitting: Internal Medicine

## 2020-06-14 ENCOUNTER — Other Ambulatory Visit: Payer: Self-pay

## 2020-06-22 ENCOUNTER — Other Ambulatory Visit: Payer: Self-pay | Admitting: Internal Medicine

## 2020-07-12 ENCOUNTER — Other Ambulatory Visit: Payer: 59

## 2020-07-12 ENCOUNTER — Other Ambulatory Visit: Payer: Self-pay

## 2020-07-12 DIAGNOSIS — E890 Postprocedural hypothyroidism: Secondary | ICD-10-CM

## 2020-07-12 DIAGNOSIS — R748 Abnormal levels of other serum enzymes: Secondary | ICD-10-CM

## 2020-07-13 ENCOUNTER — Other Ambulatory Visit: Payer: Self-pay | Admitting: Adult Health

## 2020-07-13 DIAGNOSIS — E890 Postprocedural hypothyroidism: Secondary | ICD-10-CM

## 2020-07-13 LAB — HEPATIC FUNCTION PANEL
AG Ratio: 2.2 (calc) (ref 1.0–2.5)
ALT: 42 U/L — ABNORMAL HIGH (ref 6–29)
AST: 25 U/L (ref 10–35)
Albumin: 4.3 g/dL (ref 3.6–5.1)
Alkaline phosphatase (APISO): 104 U/L (ref 37–153)
Bilirubin, Direct: 0.1 mg/dL (ref 0.0–0.2)
Globulin: 2 g/dL (calc) (ref 1.9–3.7)
Indirect Bilirubin: 0.3 mg/dL (calc) (ref 0.2–1.2)
Total Bilirubin: 0.4 mg/dL (ref 0.2–1.2)
Total Protein: 6.3 g/dL (ref 6.1–8.1)

## 2020-07-13 LAB — TSH: TSH: 0.05 mIU/L — ABNORMAL LOW (ref 0.40–4.50)

## 2020-07-13 MED ORDER — LEVOTHYROXINE SODIUM 137 MCG PO TABS: ORAL_TABLET | ORAL | 3 refills | Status: DC

## 2020-07-25 ENCOUNTER — Other Ambulatory Visit: Payer: Self-pay

## 2020-07-25 ENCOUNTER — Encounter: Payer: Self-pay | Admitting: Gynecologic Oncology

## 2020-07-25 ENCOUNTER — Inpatient Hospital Stay: Payer: 59 | Attending: Gynecologic Oncology | Admitting: Gynecologic Oncology

## 2020-07-25 VITALS — BP 110/64 | HR 70 | Temp 98.8°F | Resp 16 | Ht 64.0 in | Wt 139.0 lb

## 2020-07-25 DIAGNOSIS — E785 Hyperlipidemia, unspecified: Secondary | ICD-10-CM | POA: Insufficient documentation

## 2020-07-25 DIAGNOSIS — C541 Malignant neoplasm of endometrium: Secondary | ICD-10-CM | POA: Insufficient documentation

## 2020-07-25 DIAGNOSIS — Z90722 Acquired absence of ovaries, bilateral: Secondary | ICD-10-CM | POA: Insufficient documentation

## 2020-07-25 DIAGNOSIS — E039 Hypothyroidism, unspecified: Secondary | ICD-10-CM | POA: Insufficient documentation

## 2020-07-25 DIAGNOSIS — Z90710 Acquired absence of both cervix and uterus: Secondary | ICD-10-CM | POA: Insufficient documentation

## 2020-07-25 DIAGNOSIS — I10 Essential (primary) hypertension: Secondary | ICD-10-CM | POA: Diagnosis not present

## 2020-07-25 DIAGNOSIS — E119 Type 2 diabetes mellitus without complications: Secondary | ICD-10-CM | POA: Insufficient documentation

## 2020-07-25 DIAGNOSIS — Z79899 Other long term (current) drug therapy: Secondary | ICD-10-CM | POA: Insufficient documentation

## 2020-07-25 DIAGNOSIS — K5900 Constipation, unspecified: Secondary | ICD-10-CM | POA: Insufficient documentation

## 2020-07-25 DIAGNOSIS — I739 Peripheral vascular disease, unspecified: Secondary | ICD-10-CM | POA: Insufficient documentation

## 2020-07-25 DIAGNOSIS — Z87891 Personal history of nicotine dependence: Secondary | ICD-10-CM | POA: Insufficient documentation

## 2020-07-25 NOTE — Patient Instructions (Signed)
It was great to see you today!  I do not see or feel anything on your exam that is concerning for cancer.  I will see you back in 6 months.  If something changes before then or you develop new symptoms (such as vaginal bleeding, pelvic pain, unintentional weight loss, or change to your bowel function), please call the clinic to see me sooner at [phone].

## 2020-07-25 NOTE — Progress Notes (Signed)
Gynecologic Oncology Return Clinic Visit  07/25/2020  Reason for Visit: Surveillance visit in the setting of early stage uterine cancer  Treatment History: Oncology History  Endometrial adenocarcinoma ([place])  10/05/2019 Imaging   Pelvic US: Uterus 11.6 x 7.3 x 8.3 cm with an endometrial lining measuring 1.5 cm.  Several uterine fibroids noted, the largest measuring 6.4 x 5.8 cm.   11/18/2019 Initial Biopsy   D&C: Endometrial adenocarcinoma, FIGO grade 1   11/24/2019 Initial Diagnosis   Endometrial adenocarcinoma ([place])   12/13/2019 Surgery   Robotic-assisted laparoscopic total hysterectomy >250gm with bilateral salpingoophorectomy, SLN biopsy, minilaparotomy  Findings: enlarged 500+gm uterus, normal tubes and ovaries, no gross extratuerine disease, no apparently suspicious lymph nodes.    12/13/2019 Pathology Results   A. SENTINEL LYMPH NODE, RIGHT EXTERNAL ILIAC, EXCISION:  - One lymph node with no metastatic carcinoma (0/1).   B. SENTINEL LYMPH NODE, LEFT EXTERNAL ILIAC, EXCISION:  - One lymph node with no metastatic carcinoma (0/1).   C. UTERUS, CERVIX, BILATERAL FALLOPIAN TUBES AND OVARIES:  - Endometrium:       Microscopic focus of endometrioid adenocarcinoma.       Inactive endometrium and extensive biopsy changes.       No myometrial invasion.       See oncology table.  - Cervix:       Slight cervicitis and nabothian cyst.       No dysplasia or evidence of malignancy.  - Myometrium:       Leiomyomata.       No evidence of malignancy.  - Right and left ovaries:       Benign serous cysts.       No endometriosis or evidence of malignancy.  - Right and left fallopian tubes:       Benign paratubal cyst.       No endometriosis or evidence of malignancy.   ONCOLOGY TABLE:  UTERUS, CARCINOMA OR CARCINOSARCOMA  Procedure: Total hysterectomy and bilateral salpingo-oophorectomy.  Histologic type: Endometrioid.  Histologic Grade: FIGO grade 1.  Myometrial invasion:        Depth of invasion: 0 mm       Myometrial thickness: 15 mm  Uterine Serosa Involvement: Not present.  Cervical stromal involvement: Not present.  Extent of involvement of other organs: Not present.  Lymphovascular invasion: Not present.  Regional Lymph Nodes:       Examined:     2 Sentinel                               0 non-sentinel                               2 total        Lymph nodes with metastasis: 0        Isolated tumor cells (<0.2 mm): 0        Micrometastasis:  (>0.2 mm and < 2.0 mm): 0        Macrometastasis: (>2.0 mm): 0       Representative Tumor Block: C7  MMR / MSI testing: Insufficient residual tumor.  Pathologic Stage Classification (pTNM, AJCC 8th edition):  pT1a, pN0  Comments: The endometrium shows extensive biopsy changes and there is  focal inactive endometrium.  There is a microscopic focus of cribriform  glands with atypia consistent with minimal residual FIGO grade 1  endometrioid carcinoma.  12/13/2019 Cancer Staging   Staging form: Corpus Uteri - Carcinoma and Carcinosarcoma, AJCC 8th Edition - Clinical stage from 12/13/2019: FIGO Stage IA (cT1a, cN0(sn), cM0) - Signed by [name], MD on 12/21/2019     Interval History: Patient presents today for surveillance visit.  She notes overall doing well.  She and her husband just got back from vacation.  They were in [place] and then at [employer].  She is overall done well since her last visit with [place].  She was able to lose about 20 pounds although has gained about 5 pounds back.  This was all due to diet and exercise changes.  She denies any vaginal bleeding or discharge.  She denies any pelvic or abdominal pain.  She endorses a good appetite without nausea or vomiting.  She has noted some fairly significant constipation since starting calcium citrate.  She has intermittent rectal bleeding with bowel movements.  She has tried stool softeners without any improvement and has used fiber with good relief  although was worried about taking fiber continuously.  Is planning to but has not yet scheduled her colonoscopy.  Past Medical/Surgical History: Past Medical History:  Diagnosis Date  . Diabetes mellitus without complication ([place])   . Endometrial adenocarcinoma ([place])   . Hyperlipidemia   . Hypertension   . Hypothyroidism   . Iron deficiency anemia 01/17/2011  . Menopause   . Peripheral vascular disease ([place]) 1990   stripping   . Thyroid disease   . Type II or unspecified type diabetes mellitus without mention of complication, not stated as uncontrolled 02/04/2013  . Varicose veins   . Vitamin D deficiency     Past Surgical History:  Procedure Laterality Date  . ENDOMETRIAL ABLATION W/ NOVASURE  2015   Dr. [name]  . ROBOTIC ASSISTED TOTAL HYSTERECTOMY WITH BILATERAL SALPINGO OOPHERECTOMY Bilateral 12/13/2019   Procedure: XI ROBOTIC ASSISTED TOTAL HYSTERECTOMY WITH BILATERAL SALPINGO OOPHORECTOMY (SPECIMEN WEIGHING >250 GRAMS), MINI LAPAROTOMY;  Surgeon: Everitt Amber, MD;  Location: [facility];  Service: Gynecology;  Laterality: Bilateral;  . SENTINEL NODE BIOPSY N/A 12/13/2019   Procedure: SENTINEL LYMPH NODE BIOPSY;  Surgeon: Everitt Amber, MD;  Location: [facility];  Service: Gynecology;  Laterality: N/A;  . VEIN LIGATION AND STRIPPING Left 2017  . VEIN LIGATION AND STRIPPING Right 2017    Family History  Problem Relation Age of Onset  . Diabetes Mother   . Fibromyalgia Mother   . Arrhythmia Mother        Required chest compressions -  . Osteoporosis Mother   . Thyroid disease Father        hypothyroid  . Heart attack Father 38  . Thyroid disease Sister   . Osteoporosis Sister   . Anxiety disorder Daughter   . Colitis Daughter   . Depression Son   . Irritable bowel syndrome Son   . Breast cancer Maternal Grandmother 80    Social History   Socioeconomic History  . Marital status: Married    Spouse name: Not on file  . Number of  children: 2  . Years of education: 51  . Highest education level: High school graduate  Occupational History  . Not on file  Tobacco Use  . Smoking status: Former Smoker    Packs/day: 1.00    Years: 20.00    Pack years: 20.00    Types: Cigarettes    Start date: 03/21/1991    Quit date: 03/20/2010    Years since  quitting: 10.3  . Smokeless tobacco: Never Used  Vaping Use  . Vaping Use: Never used  Substance and Sexual Activity  . Alcohol use: Yes    Alcohol/week: 2.0 standard drinks    Types: 2 Standard drinks or equivalent per week    Comment: 2 glasses a wine once a week  . Drug use: No  . Sexual activity: Yes    Partners: Male    Birth control/protection: Surgical, Post-menopausal  Other Topics Concern  . Not on file  Social History Narrative  . Not on file   Social Determinants of Health   Financial Resource Strain: Not on file  Food Insecurity: Not on file  Transportation Needs: Not on file  Physical Activity: Not on file  Stress: Not on file  Social Connections: Not on file    Current Medications:  Current Outpatient Medications:  .  aspirin EC 81 MG tablet, Take 1 tablet (81 mg total) by mouth daily. Do not take for one week post-op. Swallow whole., Disp: 30 tablet, Rfl: 11 .  atorvastatin (LIPITOR) 80 MG tablet, TAKE 1 TABLET BY MOUTH DAILY FOR CHOLESTEROL, Disp: 90 tablet, Rfl: 33 .  Calcium Citrate-Vitamin D (CALCIUM CITRATE + D3 PO), Take 600 mg by mouth daily. , Disp: , Rfl:  .  Cholecalciferol 125 MCG (5000 UT) TABS, Take 5,000 Units by mouth daily., Disp: , Rfl:  .  Continuous Blood Gluc Sensor (FREESTYLE LIBRE 2 SENSOR) MISC, Inject 1 sensor to the skin every 14 days for continuous glucose monitoring., Disp: , Rfl:  .  docusate sodium (COLACE) 100 MG capsule, Take 100 mg by mouth daily., Disp: , Rfl:  .  ferrous fumarate (HEMOCYTE - 106 MG FE) 325 (106 FE) MG TABS, Take 1 tablet by mouth daily., Disp: , Rfl:  .  insulin glargine (LANTUS) 100 UNIT/ML  Solostar Pen, Inject 10-16 Units into the skin daily. , Disp: , Rfl:  .  insulin lispro (HUMALOG KWIKPEN) 100 UNIT/ML KwikPen, Inject 0-4 Units into the skin 3 (three) times daily before meals. Sliding scale: under 120 = 0 units  120 - 170 = 1 units  171 - 220 = 2 units  221 - 270 = 3 units  271 - 320 = 4 units  over 320 = 4 units, Disp: , Rfl:  .  levothyroxine (SYNTHROID) 137 MCG tablet, Take 1/2 tab MWF and whole tab all other days for thyroid. Take 30-60 min prior to breakfast on an emtpy stomach with water only., Disp: 90 tablet, Rfl: 3 .  lisinopril (ZESTRIL) 10 MG tablet, Take 1 tablet Daily for BP & Diabetic Kidney Protection, Disp: 90 tablet, Rfl: 1 .  Magnesium 500 MG CAPS, Take 500 mg by mouth daily., Disp: , Rfl:  .  Multiple Vitamins-Minerals (MULTIVITAMIN WITH MINERALS) tablet, Take 1 tablet by mouth daily., Disp: , Rfl:  .  Omega-3 Fatty Acids (FISH OIL) 1200 MG CAPS, Take 1 capsule (1,200 mg total) by mouth 2 (two) times daily. (Patient taking differently: Take 1,200 mg by mouth daily.), Disp: , Rfl:  .  acetaminophen (TYLENOL) 500 MG tablet, Take 500 mg by mouth every 6 (six) hours as needed for mild pain. (Patient not taking: Reported on 07/24/2020), Disp: , Rfl:  .  ibuprofen (ADVIL) 800 MG tablet, Take 1 tablet (800 mg total) by mouth every 8 (eight) hours as needed for moderate pain. For AFTER surgery (Patient not taking: Reported on 07/24/2020), Disp: 30 tablet, Rfl: 0 .  senna-docusate (SENOKOT-S) 8.6-50 MG tablet,  Take 2 tablets by mouth at bedtime. For AFTER surgery, do not take if having diarrhea (Patient not taking: Reported on 07/24/2020), Disp: 30 tablet, Rfl: 0  Review of Systems: Endorses occasional hot flashes, chronic constipation Denies appetite changes, fevers, chills, fatigue, unexplained weight changes. Denies hearing loss, neck lumps or masses, mouth sores, ringing in ears or voice changes. Denies cough or wheezing.  Denies shortness of breath. Denies chest pain or  palpitations. Denies leg swelling. Denies abdominal distention, pain, blood in stools, diarrhea, nausea, vomiting, or early satiety. Denies pain with intercourse, dysuria, frequency, hematuria or incontinence. Denies pelvic pain, vaginal bleeding or vaginal discharge.   Denies joint pain, back pain or muscle pain/cramps. Denies itching, rash, or wounds. Denies dizziness, headaches, numbness or seizures. Denies swollen lymph nodes or glands, denies easy bruising or bleeding. Denies anxiety, depression, confusion, or decreased concentration.  Physical Exam: BP 110/64 (BP Location: Left Arm, Patient Position: Sitting)   Pulse 70   Temp 98.8 F (37.1 C) (Oral)   Resp 16   Ht _0  (1.626 m)   Wt 139 lb (63 kg)   LMP 08/11/2013   SpO2 99% Comment: RA  BMI 23.86 kg/m  General: Alert, oriented, no acute distress. HEENT: Normocephalic, atraumatic, sclera anicteric. Chest: Unlabored breathing on room air. Abdomen: soft, nontender.  Normoactive bowel sounds.  No masses or hepatosplenomegaly appreciated.  Well-healed laparoscopic incisions. Extremities: Grossly normal range of motion.  Warm, well perfused.  No edema bilaterally. Skin: No rashes or lesions noted. Lymphatics: No cervical, supraclavicular, or inguinal adenopathy. GU: Normal appearing external genitalia without erythema, excoriation, or lesions.  Speculum exam reveals moderately atrophic vaginal mucosa.  Cuff intact, no lesions or masses noted.  No bleeding or discharge.  Bimanual exam reveals no nodularity or masses.  Rectovaginal exam confirms these findings, some hard stool within the rectum.  Laboratory & Radiologic Studies: None new  Assessment & Plan: Mary Frank is a 65 y.o. woman with Stage IA grade 1 who presents for surveillance visit in the setting of low risk early stage endometrial cancer.  Patient is overall doing well and is NED on exam today.  We discussed strategies in terms of her constipation, which is been  impacted by medication she is on.  She has had good relief when she has used fiber, and I encouraged her to take a fiber supplement or increase her dietary fiber intake.  If this relieves her constipation, then I do not think she needs additional treatment.  We also discussed the use of a laxative like MiraLAX or reaching out to her primary care provider regarding a different calcium supplementation that may not be as constipating.  We discussed signs and symptoms again that would be concerning for disease recurrence.  The patient knows to call if she develops any of these before her next scheduled visit.  In line with NCCN surveillance recommendations, we will continue visits every 6 months for 5 years.  28 minutes of total time was spent for this patient encounter, including preparation, face-to-face counseling with the patient and coordination of care, and documentation of the encounter.  Jeral Pinch, MD  Division of Gynecologic Oncology  Department of Obstetrics and Gynecology  [facility] of [facility]

## 2020-09-06 ENCOUNTER — Other Ambulatory Visit: Payer: Self-pay

## 2020-09-06 ENCOUNTER — Other Ambulatory Visit: Payer: 59

## 2020-09-06 DIAGNOSIS — E890 Postprocedural hypothyroidism: Secondary | ICD-10-CM

## 2020-09-07 ENCOUNTER — Telehealth: Payer: Self-pay

## 2020-09-07 LAB — TSH: TSH: 0.1 mIU/L — ABNORMAL LOW (ref 0.40–4.50)

## 2020-09-07 NOTE — Telephone Encounter (Signed)
Left message on voice mail  to call back

## 2020-09-07 NOTE — Telephone Encounter (Signed)
Patient states that she is taking Levothyroxine 171mcg, 1/2 tablet on Sundays and all other days 1 tablet. This was changed by Dr. [name]. And she states that her multivitamin does contain Biotin. Instructed her to stop taking the Multivitamin. Please clarify about how she should be taking her Levothyroxine.

## 2020-09-08 ENCOUNTER — Encounter: Payer: Self-pay | Admitting: Internal Medicine

## 2020-09-08 NOTE — Telephone Encounter (Signed)
Patient aware.

## 2020-11-10 ENCOUNTER — Other Ambulatory Visit: Payer: Self-pay

## 2020-11-10 ENCOUNTER — Encounter: Payer: Self-pay | Admitting: Internal Medicine

## 2020-11-10 ENCOUNTER — Ambulatory Visit (AMBULATORY_SURGERY_CENTER): Payer: 59 | Admitting: *Deleted

## 2020-11-10 VITALS — Ht 64.0 in | Wt 141.0 lb

## 2020-11-10 DIAGNOSIS — Z86010 Personal history of colonic polyps: Secondary | ICD-10-CM

## 2020-11-10 MED ORDER — PLENVU 140 G PO SOLR: 1.0000 | Freq: Once | ORAL | 0 refills | Status: AC

## 2020-11-10 NOTE — Progress Notes (Signed)
Patient's pre-visit was done today over the phone with the patient due to COVID-19 pandemic. Name,DOB and address verified. Insurance verified. Patient denies any allergies to Eggs and Soy. Patient denies any problems with anesthesia/sedation. Patient is not taking any diet pills or blood thinners. No home Oxygen. Packet of Prep instructions mailed to patient including a copy of a consent form &coupon-pt is aware. Patient understands to call us back with any questions or concerns. Patient is aware of our care-partner policy and 0000000 safety protocol.   EMMI education assigned to the patient for the procedure, sent to [facility].   The patient is COVID-19 vaccinated.

## 2020-11-24 ENCOUNTER — Other Ambulatory Visit: Payer: Self-pay

## 2020-11-24 ENCOUNTER — Ambulatory Visit (AMBULATORY_SURGERY_CENTER): Payer: 59 | Admitting: Internal Medicine

## 2020-11-24 ENCOUNTER — Encounter: Payer: Self-pay | Admitting: Internal Medicine

## 2020-11-24 VITALS — BP 91/58 | HR 57 | Temp 97.1°F | Resp 17 | Ht 64.0 in | Wt 141.0 lb

## 2020-11-24 DIAGNOSIS — K635 Polyp of colon: Secondary | ICD-10-CM

## 2020-11-24 DIAGNOSIS — D122 Benign neoplasm of ascending colon: Secondary | ICD-10-CM

## 2020-11-24 DIAGNOSIS — Z860100 Personal history of colon polyps, unspecified: Secondary | ICD-10-CM

## 2020-11-24 DIAGNOSIS — D123 Benign neoplasm of transverse colon: Secondary | ICD-10-CM | POA: Diagnosis not present

## 2020-11-24 DIAGNOSIS — D125 Benign neoplasm of sigmoid colon: Secondary | ICD-10-CM

## 2020-11-24 HISTORY — PX: COLONOSCOPY WITH PROPOFOL: SHX5780

## 2020-11-24 MED ORDER — SODIUM CHLORIDE 0.9 % IV SOLN: 500.0000 mL | Freq: Once | INTRAVENOUS | Status: DC

## 2020-11-24 NOTE — Patient Instructions (Signed)
Discharge instructions given. Handouts on polyps,diverticulosis and hemorrhoids. Resume previous medications. YOU HAD AN ENDOSCOPIC PROCEDURE TODAY AT [facility]:   Refer to the procedure report that was given to you for any specific questions about what was found during the examination.  If the procedure report does not answer your questions, please call your gastroenterologist to clarify.  If you requested that your care partner not be given the details of your procedure findings, then the procedure report has been included in a sealed envelope for you to review at your convenience later.  YOU SHOULD EXPECT: Some feelings of bloating in the abdomen. Passage of more gas than usual.  Walking can help get rid of the air that was put into your GI tract during the procedure and reduce the bloating. If you had a lower endoscopy (such as a colonoscopy or flexible sigmoidoscopy) you may notice spotting of blood in your stool or on the toilet paper. If you underwent a bowel prep for your procedure, you may not have a normal bowel movement for a few days.  Please Note:  You might notice some irritation and congestion in your nose or some drainage.  This is from the oxygen used during your procedure.  There is no need for concern and it should clear up in a day or so.  SYMPTOMS TO REPORT IMMEDIATELY:  Following lower endoscopy (colonoscopy or flexible sigmoidoscopy):  Excessive amounts of blood in the stool  Significant tenderness or worsening of abdominal pains  Swelling of the abdomen that is new, acute  Fever of 100F or higher  For urgent or emergent issues, a gastroenterologist can be reached at any hour by calling [phone]. Do not use MyChart messaging for urgent concerns.    DIET:  We do recommend a small meal at first, but then you may proceed to your regular diet.  Drink plenty of fluids but you should avoid alcoholic beverages for 24 hours.  ACTIVITY:  You should plan  to take it easy for the rest of today and you should NOT DRIVE or use heavy machinery until tomorrow (because of the sedation medicines used during the test).    FOLLOW UP: Our staff will call the number listed on your records 48-72 hours following your procedure to check on you and address any questions or concerns that you may have regarding the information given to you following your procedure. If we do not reach you, we will leave a message.  We will attempt to reach you two times.  During this call, we will ask if you have developed any symptoms of COVID 19. If you develop any symptoms (ie: fever, flu-like symptoms, shortness of breath, cough etc.) before then, please call [phone].  If you test positive for Covid 19 in the 2 weeks post procedure, please call and report this information to [place].    If any biopsies were taken you will be contacted by phone or by letter within the next 1-3 weeks.  Please call us at [phone] if you have not heard about the biopsies in 3 weeks.    SIGNATURES/CONFIDENTIALITY: You and/or your care partner have signed paperwork which will be entered into your electronic medical record.  These signatures attest to the fact that that the information above on your After Visit Summary has been reviewed and is understood.  Full responsibility of the confidentiality of this discharge information lies with you and/or your care-partner.

## 2020-11-24 NOTE — Progress Notes (Signed)
Report to PACU, RN, vss, BBS= Clear.  

## 2020-11-24 NOTE — Progress Notes (Signed)
GASTROENTEROLOGY PROCEDURE H&P NOTE   Primary Care Physician: Unk Pinto, MD    Reason for Procedure:  History of colon polyps including sessile serrated polyps from last colonoscopy in 2010  Plan:    colonoscopy  Patient is appropriate for endoscopic procedure(s) in the ambulatory ([place]) setting.  The nature of the procedure, as well as the risks, benefits, and alternatives were carefully and thoroughly reviewed with the patient. Ample time for discussion and questions allowed. The patient understood, was satisfied, and agreed to proceed.     HPI: Mary Frank is a 65 y.o. female who presents for colonoscopy.  Last colonoscopy 2010 with Dr. Olevia Perches.  Medical history as below. She had early stage endometrial cancer treated in October 2021 with hysterectomy.  No abdominal pain, shortness of breath or chest pain of late or today.  Past Medical History:  Diagnosis Date   Diabetes mellitus without complication ([place])    Endometrial adenocarcinoma ([place])    Hyperlipidemia    Hypertension    Hypothyroidism    Iron deficiency anemia 01/17/2011   Menopause    Peripheral vascular disease ([place]) 1990   stripping    Thyroid disease    Type II or unspecified type diabetes mellitus without mention of complication, not stated as uncontrolled 02/04/2013   Varicose veins    Vitamin D deficiency     Past Surgical History:  Procedure Laterality Date   COLONOSCOPY  12/13/2008   Dr.Brodie   ENDOMETRIAL ABLATION W/ NOVASURE  2015   Dr. [name]   ROBOTIC ASSISTED TOTAL HYSTERECTOMY WITH BILATERAL SALPINGO OOPHERECTOMY Bilateral 12/13/2019   Procedure: XI ROBOTIC ASSISTED TOTAL HYSTERECTOMY WITH BILATERAL SALPINGO OOPHORECTOMY (SPECIMEN WEIGHING >250 GRAMS), MINI LAPAROTOMY;  Surgeon: Everitt Amber, MD;  Location: [place];  Service: Gynecology;  Laterality: Bilateral;   SENTINEL NODE BIOPSY N/A 12/13/2019   Procedure: SENTINEL LYMPH NODE BIOPSY;  Surgeon: Everitt Amber, MD;   Location: [facility];  Service: Gynecology;  Laterality: N/A;   VEIN LIGATION AND STRIPPING Left 2017   VEIN LIGATION AND STRIPPING Right 2017    Prior to Admission medications   Medication Sig Start Date End Date Taking? Authorizing Provider  aspirin EC 81 MG tablet Take 1 tablet (81 mg total) by mouth daily. Do not take for one week post-op. Swallow whole. 12/13/19  Yes Cross, Melissa D, NP  atorvastatin (LIPITOR) 80 MG tablet TAKE 1 TABLET BY MOUTH DAILY FOR CHOLESTEROL 03/24/20  Yes [name], NP  Calcium Citrate-Vitamin D (CALCIUM CITRATE + D3 PO) Take 600 mg by mouth daily.    Yes [provider]  Cholecalciferol 125 MCG (5000 UT) TABS Take 5,000 Units by mouth daily.   Yes [provider]  Continuous Blood Gluc Sensor (FREESTYLE LIBRE 2 SENSOR) MISC Inject 1 sensor to the skin every 14 days for continuous glucose monitoring. 09/28/19  Yes [provider]  docusate sodium (COLACE) 100 MG capsule Take 100 mg by mouth daily.   Yes [provider]  estradiol (ESTRACE) 2 MG tablet Take 2 mg by mouth daily. 10/30/20  Yes [provider]  ferrous fumarate (HEMOCYTE - 106 MG FE) 325 (106 FE) MG TABS Take 1 tablet by mouth daily.   Yes [provider]  insulin glargine (LANTUS) 100 UNIT/ML Solostar Pen Inject 10-16 Units into the skin daily.    Yes [provider]  insulin lispro (HUMALOG KWIKPEN) 100 UNIT/ML KwikPen Inject 0-4 Units into the skin 3 (three) times daily before meals.  Sliding scale: under 120 = 0 units  120 - 170 = 1 units  171 - 220 = 2 units  221 - 270 = 3 units  271 - 320 = 4 units  over 320 = 4 units   Yes [provider]  levothyroxine (SYNTHROID) 137 MCG tablet Take 1/2 tab MWF and whole tab all other days for thyroid. Take 30-60 min prior to breakfast on an emtpy stomach with water only. 07/13/20  Yes [name], NP  lisinopril (ZESTRIL) 10 MG tablet Take 1 tablet Daily for BP &  Diabetic Kidney Protection 06/22/20  Yes Unk Pinto, MD  Magnesium 500 MG CAPS Take 500 mg by mouth daily.   Yes [provider]  Omega-3 Fatty Acids (FISH OIL) 1200 MG CAPS Take 1 capsule (1,200 mg total) by mouth 2 (two) times daily. 12/06/13  Yes Unk Pinto, MD  senna-docusate (SENOKOT-S) 8.6-50 MG tablet Take 2 tablets by mouth at bedtime. For AFTER surgery, do not take if having diarrhea 11/26/19  Yes Cross, Melissa D, NP  acetaminophen (TYLENOL) 500 MG tablet Take 500 mg by mouth every 6 (six) hours as needed for mild pain.    [provider]    Current Outpatient Medications  Medication Sig Dispense Refill   aspirin EC 81 MG tablet Take 1 tablet (81 mg total) by mouth daily. Do not take for one week post-op. Swallow whole. 30 tablet 11   atorvastatin (LIPITOR) 80 MG tablet TAKE 1 TABLET BY MOUTH DAILY FOR CHOLESTEROL 90 tablet 33   Calcium Citrate-Vitamin D (CALCIUM CITRATE + D3 PO) Take 600 mg by mouth daily.      Cholecalciferol 125 MCG (5000 UT) TABS Take 5,000 Units by mouth daily.     Continuous Blood Gluc Sensor (FREESTYLE LIBRE 2 SENSOR) MISC Inject 1 sensor to the skin every 14 days for continuous glucose monitoring.     docusate sodium (COLACE) 100 MG capsule Take 100 mg by mouth daily.     estradiol (ESTRACE) 2 MG tablet Take 2 mg by mouth daily.     ferrous fumarate (HEMOCYTE - 106 MG FE) 325 (106 FE) MG TABS Take 1 tablet by mouth daily.     insulin glargine (LANTUS) 100 UNIT/ML Solostar Pen Inject 10-16 Units into the skin daily.      insulin lispro (HUMALOG KWIKPEN) 100 UNIT/ML KwikPen Inject 0-4 Units into the skin 3 (three) times daily before meals. Sliding scale: under 120 = 0 units  120 - 170 = 1 units  171 - 220 = 2 units  221 - 270 = 3 units  271 - 320 = 4 units  over 320 = 4 units     levothyroxine (SYNTHROID) 137 MCG tablet Take 1/2 tab MWF and whole tab all other days for thyroid. Take 30-60 min prior to breakfast on an emtpy stomach  with water only. 90 tablet 3   lisinopril (ZESTRIL) 10 MG tablet Take 1 tablet Daily for BP & Diabetic Kidney Protection 90 tablet 1   Magnesium 500 MG CAPS Take 500 mg by mouth daily.     Omega-3 Fatty Acids (FISH OIL) 1200 MG CAPS Take 1 capsule (1,200 mg total) by mouth 2 (two) times daily.     senna-docusate (SENOKOT-S) 8.6-50 MG tablet Take 2 tablets by mouth at bedtime. For AFTER surgery, do not take if having diarrhea 30 tablet 0   acetaminophen (TYLENOL) 500 MG tablet Take 500 mg by mouth every 6 (six) hours as needed for mild  pain.     Current Facility-Administered Medications  Medication Dose Route Frequency Provider Last Rate Last Admin   0.9 %  sodium chloride infusion  500 mL Intravenous Once Yaa Donnellan, Lajuan Lines, MD        Allergies as of 11/24/2020   (No Known Allergies)    Family History  Problem Relation Age of Onset   Diabetes Mother    Fibromyalgia Mother    Arrhythmia Mother        Required chest compressions -   Osteoporosis Mother    Colon polyps Father    Thyroid disease Father        hypothyroid   Heart attack Father 37   Thyroid disease Sister    Osteoporosis Sister    Breast cancer Maternal Grandmother 82   Anxiety disorder Daughter    Colitis Daughter    Depression Son    Irritable bowel syndrome Son    Colon cancer Neg Hx    Esophageal cancer Neg Hx    Rectal cancer Neg Hx    Stomach cancer Neg Hx     Social History   Socioeconomic History   Marital status: Married    Spouse name: Not on file   Number of children: 2   Years of education: 93   Highest education level: High school graduate  Occupational History   Not on file  Tobacco Use   Smoking status: Former    Packs/day: 1.00    Years: 20.00    Pack years: 20.00    Types: Cigarettes    Start date: 03/21/1991    Quit date: 03/20/2010    Years since quitting: 10.6   Smokeless tobacco: Never  Vaping Use   Vaping Use: Never used  Substance and Sexual Activity   Alcohol use: Yes     Alcohol/week: 2.0 standard drinks    Types: 2 Glasses of wine per week    Comment: 2 glasses a wine once a week   Drug use: No   Sexual activity: Yes    Partners: Male    Birth control/protection: Surgical, Post-menopausal  Other Topics Concern   Not on file  Social History Narrative   Not on file   Social Determinants of Health   Financial Resource Strain: Not on file  Food Insecurity: Not on file  Transportation Needs: Not on file  Physical Activity: Not on file  Stress: Not on file  Social Connections: Not on file  Intimate Partner Violence: Not on file    Physical Exam: Vital signs in last 24 hours: @BP  (!) 89/61   Pulse (!) 58   Temp (!) 97.1 F (36.2 C) (Temporal)   Resp 15   Ht 5\' 4"  (1.626 m)   Wt 141 lb (64 kg)   LMP 08/11/2013   SpO2 94%   BMI 24.20 kg/m  GEN: NAD EYE: Sclerae anicteric ENT: MMM CV: Non-tachycardic Pulm: CTA b/l GI: Soft, NT/ND NEURO:  Alert & Oriented x 3   Zenovia Jarred, MD [facility]  11/24/2020 10:51 AM

## 2020-11-24 NOTE — Progress Notes (Signed)
Per Dr. Hilarie Fredrickson add Miralax  17g by mouth daily.

## 2020-11-24 NOTE — Progress Notes (Signed)
Pt's states no medical or surgical changes since previsit or office visit. 

## 2020-11-24 NOTE — Op Note (Signed)
[name] Patient Name: Mary Frank Procedure Date: 11/24/2020 10:42 AM MRN: [id number] Endoscopist: Jerene Bears , MD Age: 65 Referring MD:  Date of Birth: 03-27-1955 Gender: Female Account #: [id number] Procedure:                Colonoscopy Indications:              High risk colon cancer surveillance: Personal                            history of sessile serrated colon polyps with no                            dysplasia at last and 1st colonoscopy with Dr.                            Olevia Perches in 2010 Medicines:                Monitored Anesthesia Care Procedure:                Pre-Anesthesia Assessment:                           - Prior to the procedure, a History and Physical                            was performed, and patient medications and                            allergies were reviewed. The patient's tolerance of                            previous anesthesia was also reviewed. The risks                            and benefits of the procedure and the sedation                            options and risks were discussed with the patient.                            All questions were answered, and informed consent                            was obtained. Prior Anticoagulants: The patient has                            taken no previous anticoagulant or antiplatelet                            agents. ASA Grade Assessment: II - A patient with                            mild systemic disease. After reviewing the risks  and benefits, the patient was deemed in                            satisfactory condition to undergo the procedure.                           After obtaining informed consent, the colonoscope                            was passed under direct vision. Throughout the                            procedure, the patient's blood pressure, pulse, and                            oxygen saturations were monitored continuously. The                             CF [id number] #[id number] was introduced through the anus                            and advanced to the cecum, identified by                            appendiceal orifice and ileocecal valve. The                            colonoscopy was performed without difficulty. The                            patient tolerated the procedure well. The quality                            of the bowel preparation was fair particularly in                            the left colon (retained solid stools). The                            ileocecal valve, appendiceal orifice, and rectum                            were photographed. Scope In: 11:03:52 AM Scope Out: 11:30:01 AM Scope Withdrawal Time: 0 hours 18 minutes 9 seconds  Total Procedure Duration: 0 hours 26 minutes 9 seconds  Findings:                 The digital rectal exam was normal.                           A 5 mm polyp was found in the ascending colon. The                            polyp was sessile. The polyp was removed with a  cold snare. Resection and retrieval were complete.                           A 4 mm polyp was found in the transverse colon. The                            polyp was sessile. The polyp was removed with a                            cold snare. Resection and retrieval were complete.                           A 4 mm polyp was found in the sigmoid colon. The                            polyp was sessile. The polyp was removed with a                            cold snare. Resection and retrieval were complete.                           Multiple small and large-mouthed diverticula were                            found in the sigmoid colon and descending colon.                           A diffuse area of mild melanosis was found in the                            entire colon.                           Internal hemorrhoids were found during                            retroflexion. The  hemorrhoids were small. Complications:            No immediate complications. Estimated Blood Loss:     Estimated blood loss was minimal. Impression:               - Preparation of the colon was fair in distal colon.                           - One 5 mm polyp in the ascending colon, removed                            with a cold snare. Resected and retrieved.                           - One 4 mm polyp in the transverse colon, removed  with a cold snare. Resected and retrieved.                           - One 4 mm polyp in the sigmoid colon, removed with                            a cold snare. Resected and retrieved.                           - Diverticulosis in the sigmoid colon and in the                            descending colon.                           - Diffuse mild melanosis.                           - Small internal hemorrhoids. Recommendation:           - Patient has a contact number available for                            emergencies. The signs and symptoms of potential                            delayed complications were discussed with the                            patient. Return to normal activities tomorrow.                            Written discharge instructions were provided to the                            patient.                           - Resume previous diet.                           - Continue present medications.                           - Await pathology results.                           - Repeat colonoscopy is recommended for                            surveillance. The colonoscopy date will be                            determined after pathology results from today's                            exam become available  for review. Jerene Bears, MD 11/24/2020 11:38:46 AM This report has been signed electronically.

## 2020-11-24 NOTE — Progress Notes (Signed)
Called to room to assist during endoscopic procedure.  Patient ID and intended procedure confirmed with present staff. Received instructions for my participation in the procedure from the performing physician.  

## 2020-11-28 ENCOUNTER — Telehealth: Payer: Self-pay | Admitting: *Deleted

## 2020-11-28 ENCOUNTER — Telehealth: Payer: Self-pay

## 2020-11-28 NOTE — Telephone Encounter (Signed)
Follow up call made. 

## 2020-11-28 NOTE — Telephone Encounter (Signed)
Second post procedure follow up call, no answer 

## 2020-11-28 NOTE — Telephone Encounter (Signed)
Returned pt's call back. Phone goes straight to voicemail. Left a message concerning her not having a bowel movement yet and told her that was normal. Instructed that it can take a few days for the bowels to return but that if she was feeling constipated or bloated she could take an over-the-counter laxative like Miralax. Instructed pt to call back if she is having pain or any other concerns.

## 2020-11-28 NOTE — Telephone Encounter (Signed)
Patient returning missed call. Patient states she is feeling fine. Patient states she hasn't had a bowel movement. Please advise thank you

## 2020-11-29 ENCOUNTER — Encounter: Payer: Self-pay | Admitting: Internal Medicine

## 2020-12-19 NOTE — Progress Notes (Signed)
FOLLOW UP  Assessment and Plan:   Hypertension Hypotensive episodes with fatigue; reduce lisinopril dose to 5 mg daily Monitor blood pressure at home; patient to call if consistently greater than 130/80 Continue DASH diet.   Reminder to go to the ER if any CP, SOB, nausea, dizziness, severe HA, changes vision/speech, left arm numbness and tingling and jaw pain.  Cholesterol LDL goal of <70; at goal, continue atorvastatin Continue low cholesterol diet and exercise.  Check lipid panel.   Diabetes with CKD II Continue medications Continue diet and exercise.  Perform daily foot/skin check, notify office of any concerning changes.  Defer A1C to endocrinology who is managing; check annually here at [facility]  Vitamin D Def/ osteoporosis prevention Recent check at goal; Continue supplementation Defer Vit D level to [facility]  Hypothyroidism continue medications the same  pending lab results reminded to take on an empty stomach 30-1mins before food.  check TSH level  Endometrial adenocarcinoma (HCC) S/p TAH, plan to monitor q67m; will see Dr. Kathrine Haddock. [name] alternating; no adjunct therapy was recommended.   Anisocoria  ? New finding this year, follows ophthalmology annually, ? R/t previously noted mild glaucoma in 2020; recent report requested; otherwise benign exam and history, she reports has upcoming appointment with new vision provider and plans to discuss  Need for influenza vaccine Quadrivalent flu vaccine administered without complication today    Continue diet and meds as discussed. Further disposition pending results of labs. Discussed med's effects and SE's.   Over 30 minutes of exam, counseling, chart review, and critical decision making was performed.   Future Appointments  Date Time Provider [place]  01/29/2021  1:00 PM [name], MD [facility] None  05/24/2021 10:00 AM [name], NP [facility] None     ----------------------------------------------------------------------------------------------------------------------  HPI 65 y.o. female  presents for 6 month follow up on hypertension, cholesterol, hypothyroid and vitamin D deficiency. Seeing endocrinology q26m for diabetes.  She reports she was let go from her job, unrelated to covid, has opted for early retirement and enjoying this.   She was found to have endometrial cancer in 2021 and on 12/13/2019 underwent robotic assisted hysterectomy with bilateral salpingooopherecomy with sentinel lymph node biopsy by Dr. Everitt Amber. Pathology revealed a low risk grade 1 stage Ia endometrial adenocarcinoma.  No adjuvant therapy was recommended. Plan to follow up q74m x 5 year, alternating with Dr. [name] and Dr. Denman George.   BMI is Body mass index is 25.06 kg/m., she has been working on diet and exercise, has been working on keto diet to try to lose some weight.  Wt Readings from Last 3 Encounters:  12/20/20 146 lb (66.2 kg)  11/24/20 141 lb (64 kg)  11/10/20 141 lb (64 kg)   Her blood pressure has been intermittently low at home, today their BP is BP: (!) 88/58  She does workout. She denies chest pain, shortness of breath, dizziness. She does endorse fatigue.    She is on cholesterol medication (atorvastatin 40 mg daily) and denies myalgias. Her cholesterol is at goal. Had cholesterol checked July by endocrine, LDL was 78. The cholesterol last visit was:   Lab Results  Component Value Date   CHOL 137 05/23/2020   HDL 57 05/23/2020   LDLCALC 66 05/23/2020   TRIG 64 05/23/2020   CHOLHDL 2.4 05/23/2020    She has been working on diet and exercise for LADA diabetes managed by [facility]. [name]) on insulin (lantus 12 units and humalog sliding scale), and denies  hyperglycemia, hypoglycemia , increased appetite, nausea, paresthesia of the feet, visual disturbances and vomiting. She reports blood sugars much more stable with freetyle  libre and keto diet, average 107. Last A1C in the office was:  Lab Results  Component Value Date   HGBA1C 6.1 (H) 05/23/2020  last A1C in July was 6.6%, has upcoming appointment.   Patient is on Vitamin D supplement and at goal:    Lab Results  Component Value Date   VD25OH 54 05/23/2020     She is on thyroid medication. Her medication was not changed last visit. She reports levothyroxine was reduced from 137 mcg daily to 1/2 tab twice a week.  Lab Results  Component Value Date   TSH 0.10 (L) 09/06/2020    Current Medications:  Current Outpatient Medications on File Prior to Visit  Medication Sig   acetaminophen (TYLENOL) 500 MG tablet Take 500 mg by mouth every 6 (six) hours as needed for mild pain.   aspirin EC 81 MG tablet Take 1 tablet (81 mg total) by mouth daily. Do not take for one week post-op. Swallow whole.   atorvastatin (LIPITOR) 80 MG tablet TAKE 1 TABLET BY MOUTH DAILY FOR CHOLESTEROL   Calcium Citrate-Vitamin D (CALCIUM CITRATE + D3 PO) Take 600 mg by mouth daily.    Cholecalciferol 125 MCG (5000 UT) TABS Take 5,000 Units by mouth daily.   Continuous Blood Gluc Sensor (FREESTYLE LIBRE 2 SENSOR) MISC Inject 1 sensor to the skin every 14 days for continuous glucose monitoring.   estradiol (ESTRACE) 2 MG tablet Take 2 mg by mouth daily.   ferrous fumarate (HEMOCYTE - 106 MG FE) 325 (106 FE) MG TABS Take 1 tablet by mouth daily.   insulin glargine (LANTUS) 100 UNIT/ML Solostar Pen Inject 10-16 Units into the skin daily.    insulin lispro (HUMALOG KWIKPEN) 100 UNIT/ML KwikPen Inject 0-4 Units into the skin 3 (three) times daily before meals. Sliding scale: under 120 = 0 units  120 - 170 = 1 units  171 - 220 = 2 units  221 - 270 = 3 units  271 - 320 = 4 units  over 320 = 4 units   Magnesium 500 MG CAPS Take 500 mg by mouth daily.   Omega-3 Fatty Acids (FISH OIL) 1200 MG CAPS Take 1 capsule (1,200 mg total) by mouth 2 (two) times daily.   Polyethylene Glycol 3350  (MIRALAX PO) Take by mouth daily.   senna-docusate (SENOKOT-S) 8.6-50 MG tablet Take 2 tablets by mouth at bedtime. For AFTER surgery, do not take if having diarrhea   No current facility-administered medications on file prior to visit.     Allergies: No Known Allergies   Medical History:  Past Medical History:  Diagnosis Date   Diabetes mellitus without complication ([place])    Endometrial adenocarcinoma ([place])    Hyperlipidemia    Hypertension    Hypothyroidism    Iron deficiency anemia 01/17/2011   Menopause    Peripheral vascular disease ([place]) 1990   stripping    Thyroid disease    Type II or unspecified type diabetes mellitus without mention of complication, not stated as uncontrolled 02/04/2013   Varicose veins    Vitamin D deficiency    Family history- Reviewed and unchanged Social history- Reviewed and unchanged   Review of Systems:  Review of Systems  Constitutional:  Negative for malaise/fatigue and weight loss.  HENT:  Negative for hearing loss and tinnitus.   Eyes:  Negative for blurred vision  and double vision.  Respiratory:  Negative for cough, shortness of breath and wheezing.   Cardiovascular:  Negative for chest pain, palpitations, orthopnea, claudication and leg swelling.  Gastrointestinal:  Negative for abdominal pain, blood in stool, constipation, diarrhea, heartburn, melena, nausea and vomiting.  Genitourinary: Negative.   Musculoskeletal:  Negative for joint pain and myalgias.  Skin:  Negative for rash.  Neurological:  Negative for dizziness, tingling, sensory change, weakness and headaches.  Endo/Heme/Allergies:  Negative for polydipsia.  Psychiatric/Behavioral: Negative.    All other systems reviewed and are negative.  Physical Exam: BP (!) 88/58   Pulse 68   Temp 97.6 F (36.4 C)   Wt 146 lb (66.2 kg)   LMP 08/11/2013   SpO2 98%   BMI 25.06 kg/m  Wt Readings from Last 3 Encounters:  12/20/20 146 lb (66.2 kg)  11/24/20 141 lb (64 kg)   11/10/20 141 lb (64 kg)   General Appearance: Well nourished, in no apparent distress. Eyes: L pupil > R, otherwise similarly reactive and accommodates, EOMs symmetrical, conjunctiva no swelling or erythema Sinuses: No Frontal/maxillary tenderness ENT/Mouth: Ext aud canals clear, TMs without erythema, bulging. No erythema, swelling, or exudate on post pharynx.  Tonsils not swollen or erythematous. Hearing normal.  Neck: Supple, thyroid normal.  Respiratory: Respiratory effort normal, BS equal bilaterally without rales, rhonchi, wheezing or stridor.  Cardio: RRR with no RGs; no murmer noted today. Brisk peripheral pulses without edema.  Abdomen: Soft, + BS.  Non tender, no guarding, rebound, hernias, masses. Lymphatics: Non tender without lymphadenopathy.  Musculoskeletal: Full ROM, 5/5 strength, Normal gait Skin: Warm, dry without rashes, lesions, ecchymosis.  Neuro: Cranial nerves intact. No cerebellar symptoms.  Psych: Awake and oriented X 3, normal affect, Insight and Judgment appropriate.    Izora Ribas, NP 12:16 PM [facility]

## 2020-12-20 ENCOUNTER — Encounter: Payer: Self-pay | Admitting: Adult Health

## 2020-12-20 ENCOUNTER — Other Ambulatory Visit: Payer: Self-pay

## 2020-12-20 ENCOUNTER — Ambulatory Visit: Payer: 59 | Admitting: Adult Health

## 2020-12-20 VITALS — BP 88/58 | HR 68 | Temp 97.6°F | Wt 146.0 lb

## 2020-12-20 DIAGNOSIS — E1122 Type 2 diabetes mellitus with diabetic chronic kidney disease: Secondary | ICD-10-CM

## 2020-12-20 DIAGNOSIS — E559 Vitamin D deficiency, unspecified: Secondary | ICD-10-CM

## 2020-12-20 DIAGNOSIS — E890 Postprocedural hypothyroidism: Secondary | ICD-10-CM

## 2020-12-20 DIAGNOSIS — Z79899 Other long term (current) drug therapy: Secondary | ICD-10-CM

## 2020-12-20 DIAGNOSIS — E1169 Type 2 diabetes mellitus with other specified complication: Secondary | ICD-10-CM

## 2020-12-20 DIAGNOSIS — E785 Hyperlipidemia, unspecified: Secondary | ICD-10-CM

## 2020-12-20 DIAGNOSIS — Z23 Encounter for immunization: Secondary | ICD-10-CM

## 2020-12-20 DIAGNOSIS — N182 Chronic kidney disease, stage 2 (mild): Secondary | ICD-10-CM

## 2020-12-20 DIAGNOSIS — E139 Other specified diabetes mellitus without complications: Secondary | ICD-10-CM | POA: Diagnosis not present

## 2020-12-20 DIAGNOSIS — I10 Essential (primary) hypertension: Secondary | ICD-10-CM

## 2020-12-20 DIAGNOSIS — C541 Malignant neoplasm of endometrium: Secondary | ICD-10-CM

## 2020-12-20 LAB — HM DIABETES EYE EXAM

## 2020-12-20 MED ORDER — LEVOTHYROXINE SODIUM 137 MCG PO TABS: ORAL_TABLET | ORAL | 3 refills | Status: DC

## 2020-12-20 MED ORDER — LISINOPRIL 5 MG PO TABS: ORAL_TABLET | ORAL | 3 refills | Status: DC

## 2020-12-21 ENCOUNTER — Other Ambulatory Visit: Payer: Self-pay | Admitting: Adult Health

## 2020-12-21 LAB — COMPLETE METABOLIC PANEL WITH GFR
AG Ratio: 1.7 (calc) (ref 1.0–2.5)
ALT: 32 U/L — ABNORMAL HIGH (ref 6–29)
AST: 23 U/L (ref 10–35)
Albumin: 4 g/dL (ref 3.6–5.1)
Alkaline phosphatase (APISO): 88 U/L (ref 37–153)
BUN/Creatinine Ratio: 41 (calc) — ABNORMAL HIGH (ref 6–22)
BUN: 30 mg/dL — ABNORMAL HIGH (ref 7–25)
CO2: 26 mmol/L (ref 20–32)
Calcium: 9.5 mg/dL (ref 8.6–10.4)
Chloride: 105 mmol/L (ref 98–110)
Creat: 0.74 mg/dL (ref 0.50–1.05)
Globulin: 2.4 g/dL (calc) (ref 1.9–3.7)
Glucose, Bld: 134 mg/dL — ABNORMAL HIGH (ref 65–99)
Potassium: 4.4 mmol/L (ref 3.5–5.3)
Sodium: 140 mmol/L (ref 135–146)
Total Bilirubin: 0.3 mg/dL (ref 0.2–1.2)
Total Protein: 6.4 g/dL (ref 6.1–8.1)
eGFR: 91 mL/min/{1.73_m2} (ref >=60)

## 2020-12-21 LAB — LIPID PANEL
Cholesterol: 165 mg/dL (ref <200)
HDL: 71 mg/dL (ref >=50)
LDL Cholesterol (Calc): 76 mg/dL (calc)
Non-HDL Cholesterol (Calc): 94 mg/dL (calc) (ref <130)
Total CHOL/HDL Ratio: 2.3 (calc) (ref <5.0)
Triglycerides: 96 mg/dL (ref <150)

## 2020-12-21 LAB — TSH: TSH: 1.04 mIU/L (ref 0.40–4.50)

## 2020-12-21 LAB — CBC WITH DIFFERENTIAL/PLATELET
Absolute Monocytes: 392 cells/uL (ref 200–950)
Basophils Absolute: 50 cells/uL (ref 0–200)
Basophils Relative: 0.9 %
Eosinophils Absolute: 90 cells/uL (ref 15–500)
Eosinophils Relative: 1.6 %
HCT: 45.1 % — ABNORMAL HIGH (ref 35.0–45.0)
Hemoglobin: 14.5 g/dL (ref 11.7–15.5)
Lymphs Abs: 2010 cells/uL (ref 850–3900)
MCH: 27.7 pg (ref 27.0–33.0)
MCHC: 32.2 g/dL (ref 32.0–36.0)
MCV: 86.1 fL (ref 80.0–100.0)
MPV: 10.6 fL (ref 7.5–12.5)
Monocytes Relative: 7 %
Neutro Abs: 3058 cells/uL (ref 1500–7800)
Neutrophils Relative %: 54.6 %
Platelets: 283 10*3/uL (ref 140–400)
RBC: 5.24 10*6/uL — ABNORMAL HIGH (ref 3.80–5.10)
RDW: 11.8 % (ref 11.0–15.0)
Total Lymphocyte: 35.9 %
WBC: 5.6 10*3/uL (ref 3.8–10.8)

## 2020-12-21 LAB — MAGNESIUM: Magnesium: 2.1 mg/dL (ref 1.5–2.5)

## 2020-12-21 MED ORDER — LISINOPRIL 2.5 MG PO TABS: ORAL_TABLET | ORAL | 3 refills | Status: DC

## 2020-12-25 ENCOUNTER — Encounter: Payer: Self-pay | Admitting: Internal Medicine

## 2021-01-04 ENCOUNTER — Telehealth: Payer: Self-pay | Admitting: *Deleted

## 2021-01-04 NOTE — Telephone Encounter (Signed)
Returned the patient's call and moved her appt from 11/30 to 12/9

## 2021-01-29 ENCOUNTER — Ambulatory Visit: Payer: 59 | Admitting: Gynecologic Oncology

## 2021-01-31 ENCOUNTER — Ambulatory Visit: Payer: 59 | Admitting: Gynecologic Oncology

## 2021-01-31 LAB — HM DIABETES EYE EXAM

## 2021-02-01 ENCOUNTER — Encounter: Payer: Self-pay | Admitting: Internal Medicine

## 2021-02-08 NOTE — Progress Notes (Signed)
Gynecologic Oncology Return Clinic Visit  02/09/21  Reason for Visit: Surveillance visit in the setting of early stage uterine cancer  Treatment History: Oncology History  Endometrial adenocarcinoma ([place])  10/05/2019 Imaging   Pelvic US: Uterus 11.6 x 7.3 x 8.3 cm with an endometrial lining measuring 1.5 cm.  Several uterine fibroids noted, the largest measuring 6.4 x 5.8 cm.   11/18/2019 Initial Biopsy   D&C: Endometrial adenocarcinoma, FIGO grade 1   11/24/2019 Initial Diagnosis   Endometrial adenocarcinoma ([place])   12/13/2019 Surgery   Robotic-assisted laparoscopic total hysterectomy >250gm with bilateral salpingoophorectomy, SLN biopsy, minilaparotomy  Findings: enlarged 500+gm uterus, normal tubes and ovaries, no gross extratuerine disease, no apparently suspicious lymph nodes.    12/13/2019 Pathology Results   A. SENTINEL LYMPH NODE, RIGHT EXTERNAL ILIAC, EXCISION:  - One lymph node with no metastatic carcinoma (0/1).   B. SENTINEL LYMPH NODE, LEFT EXTERNAL ILIAC, EXCISION:  - One lymph node with no metastatic carcinoma (0/1).   C. UTERUS, CERVIX, BILATERAL FALLOPIAN TUBES AND OVARIES:  - Endometrium:       Microscopic focus of endometrioid adenocarcinoma.       Inactive endometrium and extensive biopsy changes.       No myometrial invasion.       See oncology table.  - Cervix:       Slight cervicitis and nabothian cyst.       No dysplasia or evidence of malignancy.  - Myometrium:       Leiomyomata.       No evidence of malignancy.  - Right and left ovaries:       Benign serous cysts.       No endometriosis or evidence of malignancy.  - Right and left fallopian tubes:       Benign paratubal cyst.       No endometriosis or evidence of malignancy.   ONCOLOGY TABLE:  UTERUS, CARCINOMA OR CARCINOSARCOMA  Procedure: Total hysterectomy and bilateral salpingo-oophorectomy.  Histologic type: Endometrioid.  Histologic Grade: FIGO grade 1.  Myometrial invasion:        Depth of invasion: 0 mm       Myometrial thickness: 15 mm  Uterine Serosa Involvement: Not present.  Cervical stromal involvement: Not present.  Extent of involvement of other organs: Not present.  Lymphovascular invasion: Not present.  Regional Lymph Nodes:       Examined:     2 Sentinel                               0 non-sentinel                               2 total        Lymph nodes with metastasis: 0        Isolated tumor cells (<0.2 mm): 0        Micrometastasis:  (>0.2 mm and < 2.0 mm): 0        Macrometastasis: (>2.0 mm): 0       Representative Tumor Block: C7  MMR / MSI testing: Insufficient residual tumor.  Pathologic Stage Classification (pTNM, AJCC 8th edition):  pT1a, pN0  Comments: The endometrium shows extensive biopsy changes and there is  focal inactive endometrium.  There is a microscopic focus of cribriform  glands with atypia consistent with minimal residual FIGO grade 1  endometrioid carcinoma.  12/13/2019 Cancer Staging   Staging form: Corpus Uteri - Carcinoma and Carcinosarcoma, AJCC 8th Edition - Clinical stage from 12/13/2019: FIGO Stage IA (cT1a, cN0(sn), cM0) - Signed by [name], MD on 12/21/2019      Interval History: Presents today for surveillance visit.  Notes overall doing well.  Secondary to hot flashes after her surgery, she was restarted on estrogen only at a low dose by her gynecologist.  Reports significant improvement with hot flashes.  Has some dyspareunia.  She denies any vaginal bleeding or discharge.  Denies any urinary symptoms.  Has been using MiraLAX more regularly since her colonoscopy in September.  Unfortunately during her colonoscopy, stool burden did not allow for completion of the colonoscopy.  Endorses a good appetite, no nausea or emesis.  Is just at the tail end of a cold.  Took 2 COVID test which were both negative.  Past Medical/Surgical History: Past Medical History:  Diagnosis Date   Diabetes mellitus  without complication ([place])    Endometrial adenocarcinoma ([place])    Hyperlipidemia    Hypertension    Hypothyroidism    Iron deficiency anemia 01/17/2011   Menopause    Peripheral vascular disease ([place]) 1990   stripping    Thyroid disease    Type II or unspecified type diabetes mellitus without mention of complication, not stated as uncontrolled 02/04/2013   Varicose veins    Vitamin D deficiency     Past Surgical History:  Procedure Laterality Date   COLONOSCOPY  12/13/2008   Dr.Brodie   ENDOMETRIAL ABLATION W/ NOVASURE  2015   Dr. [name]   ROBOTIC ASSISTED TOTAL HYSTERECTOMY WITH BILATERAL SALPINGO OOPHERECTOMY Bilateral 12/13/2019   Procedure: XI ROBOTIC ASSISTED TOTAL HYSTERECTOMY WITH BILATERAL SALPINGO OOPHORECTOMY (SPECIMEN WEIGHING >250 GRAMS), MINI LAPAROTOMY;  Surgeon: Everitt Amber, MD;  Location: [facility];  Service: Gynecology;  Laterality: Bilateral;   SENTINEL NODE BIOPSY N/A 12/13/2019   Procedure: SENTINEL LYMPH NODE BIOPSY;  Surgeon: Everitt Amber, MD;  Location: [facility];  Service: Gynecology;  Laterality: N/A;   VEIN LIGATION AND STRIPPING Left 2017   VEIN LIGATION AND STRIPPING Right 2017    Family History  Problem Relation Age of Onset   Diabetes Mother    Fibromyalgia Mother    Arrhythmia Mother        Required chest compressions -   Osteoporosis Mother    Colon polyps Father    Thyroid disease Father        hypothyroid   Heart attack Father 87   Thyroid disease Sister    Osteoporosis Sister    Breast cancer Maternal Grandmother 84   Anxiety disorder Daughter    Colitis Daughter    Depression Son    Irritable bowel syndrome Son    Colon cancer Neg Hx    Esophageal cancer Neg Hx    Rectal cancer Neg Hx    Stomach cancer Neg Hx     Social History   Socioeconomic History   Marital status: Married    Spouse name: Not on file   Number of children: 2   Years of education: 87   Highest education level: High school  graduate  Occupational History   Not on file  Tobacco Use   Smoking status: Former    Packs/day: 1.00    Years: 20.00    Pack years: 20.00    Types: Cigarettes    Start date: 03/21/1991    Quit date: 03/20/2010    Years  since quitting: 10.9   Smokeless tobacco: Never  Vaping Use   Vaping Use: Never used  Substance and Sexual Activity   Alcohol use: Yes    Alcohol/week: 2.0 standard drinks    Types: 2 Glasses of wine per week    Comment: 2 glasses a wine once a week   Drug use: No   Sexual activity: Yes    Partners: Male    Birth control/protection: Surgical, Post-menopausal  Other Topics Concern   Not on file  Social History Narrative   Not on file   Social Determinants of Health   Financial Resource Strain: Not on file  Food Insecurity: Not on file  Transportation Needs: Not on file  Physical Activity: Not on file  Stress: Not on file  Social Connections: Not on file    Current Medications:  Current Outpatient Medications:    acetaminophen (TYLENOL) 500 MG tablet, Take 500 mg by mouth every 6 (six) hours as needed for mild pain., Disp: , Rfl:    aspirin EC 81 MG tablet, Take 1 tablet (81 mg total) by mouth daily. Do not take for one week post-op. Swallow whole., Disp: 30 tablet, Rfl: 11   atorvastatin (LIPITOR) 80 MG tablet, TAKE 1 TABLET BY MOUTH DAILY FOR CHOLESTEROL, Disp: 90 tablet, Rfl: 33   Calcium Citrate-Vitamin D (CALCIUM CITRATE + D3 PO), Take 600 mg by mouth daily. , Disp: , Rfl:    Cholecalciferol 125 MCG (5000 UT) TABS, Take 5,000 Units by mouth daily., Disp: , Rfl:    Continuous Blood Gluc Sensor (FREESTYLE LIBRE 2 SENSOR) MISC, Inject 1 sensor to the skin every 14 days for continuous glucose monitoring., Disp: , Rfl:    estradiol (ESTRACE) 2 MG tablet, Take 2 mg by mouth daily., Disp: , Rfl:    ferrous fumarate (HEMOCYTE - 106 MG FE) 325 (106 FE) MG TABS, Take 1 tablet by mouth daily., Disp: , Rfl:    insulin glargine (LANTUS) 100 UNIT/ML Solostar Pen,  Inject 10-16 Units into the skin daily. , Disp: , Rfl:    insulin lispro (HUMALOG KWIKPEN) 100 UNIT/ML KwikPen, Inject 0-4 Units into the skin 3 (three) times daily before meals. Sliding scale: under 120 = 0 units  120 - 170 = 1 units  171 - 220 = 2 units  221 - 270 = 3 units  271 - 320 = 4 units  over 320 = 4 units, Disp: , Rfl:    levothyroxine (SYNTHROID) 137 MCG tablet, Take 1/2 tab 2 days a week and whole tab all other days for thyroid. Take 30-60 min prior to breakfast on an emtpy stomach with water only., Disp: 90 tablet, Rfl: 3   lisinopril (ZESTRIL) 2.5 MG tablet, Take 1 tablet Daily for BP & Diabetic Kidney Protection, Disp: 90 tablet, Rfl: 3   Magnesium 500 MG CAPS, Take 500 mg by mouth daily., Disp: , Rfl:    Omega-3 Fatty Acids (FISH OIL) 1200 MG CAPS, Take 1 capsule (1,200 mg total) by mouth 2 (two) times daily., Disp: , Rfl:    Polyethylene Glycol 3350 (MIRALAX PO), Take by mouth daily., Disp: , Rfl:    senna-docusate (SENOKOT-S) 8.6-50 MG tablet, Take 2 tablets by mouth at bedtime. For AFTER surgery, do not take if having diarrhea, Disp: 30 tablet, Rfl: 0  Review of Systems: Denies appetite changes, fevers, chills, fatigue, unexplained weight changes. Denies hearing loss, neck lumps or masses, mouth sores, ringing in ears or voice changes. Denies cough or wheezing.  Denies  shortness of breath. Denies chest pain or palpitations. Denies leg swelling. Denies abdominal distention, pain, blood in stools, constipation, diarrhea, nausea, vomiting, or early satiety. Denies pain with intercourse, dysuria, frequency, hematuria or incontinence. Denies hot flashes, pelvic pain, vaginal bleeding or vaginal discharge.   Denies joint pain, back pain or muscle pain/cramps. Denies itching, rash, or wounds. Denies dizziness, headaches, numbness or seizures. Denies swollen lymph nodes or glands, denies easy bruising or bleeding. Denies anxiety, depression, confusion, or decreased  concentration.  Physical Exam: BP 123/76 (BP Location: Left Arm, Patient Position: Sitting)   Pulse 67   Temp 98.1 F (36.7 C) (Oral)   Resp 16   Ht '5\' 4"'  (1.626 m)   Wt 145 lb 12.8 oz (66.1 kg)   LMP 08/11/2013   SpO2 99%   BMI 25.03 kg/m  General: Alert, oriented, no acute distress. HEENT: Normocephalic, atraumatic, sclera anicteric. Chest: Clear to auscultation bilaterally.  No wheezes or rhonchi. Cardiovascular: Regular rate and rhythm, no murmurs. Abdomen: soft, nontender.  Normoactive bowel sounds.  No masses or hepatosplenomegaly appreciated.  Well-healed incisions. Extremities: Grossly normal range of motion.  Warm, well perfused.  No edema bilaterally. Skin: No rashes or lesions noted. Lymphatics: No cervical, supraclavicular, or inguinal adenopathy. GU: Normal appearing external genitalia without erythema, excoriation, or lesions.  Speculum exam reveals well rugated vaginal mucosa, no lesions or masses.  No bleeding or discharge.  Cuff intact.  Bimanual exam reveals cuff intact, no nodularity or masses.  Rectovaginal exam confirms these findings.  Laboratory & Radiologic Studies: None new  Assessment & Plan: Mary Frank is a 65 y.o. woman with Stage IA grade 1 who presents for surveillance visit in the setting of low risk early stage endometrial cancer.  Surgery completed 12/2019.  Patient continues to do well and is NED on exam.  She has been using MiraLAX more regularly after her colonoscopy and has noted significant improvement in her baseline constipation.  Discussed her dyspareunia.  She was restarted recently on estrogen giving hot flashes.  These have improved.  We discussed that systemic estrogen could help with vaginal dryness symptoms, but often vaginal estrogen is more effective.  She has been using lubrication, but it sounds like not regularly or in larger amounts.  I have encouraged her to use lubrication with intercourse and if this does not help improve her  dyspareunia, to call me and we can discuss trial of vaginal estrogen.   We discussed signs and symptoms again that would be concerning for disease recurrence.  The patient knows to call if she develops any of these before her next scheduled visit.  In line with NCCN surveillance recommendations, we will continue visits every 6 months for 5 years.  Per NCCN and SGO surveillance recommendations, Pap smear is not indicated in this patient.  32 minutes of total time was spent for this patient encounter, including preparation, face-to-face counseling with the patient and coordination of care, and documentation of the encounter.  Jeral Pinch, MD  Division of Gynecologic Oncology  Department of Obstetrics and Gynecology  [facility]

## 2021-02-09 ENCOUNTER — Inpatient Hospital Stay: Payer: 59 | Attending: Gynecologic Oncology | Admitting: Gynecologic Oncology

## 2021-02-09 ENCOUNTER — Encounter: Payer: Self-pay | Admitting: Gynecologic Oncology

## 2021-02-09 ENCOUNTER — Other Ambulatory Visit: Payer: Self-pay

## 2021-02-09 VITALS — BP 123/76 | HR 67 | Temp 98.1°F | Resp 16 | Ht 64.0 in | Wt 145.8 lb

## 2021-02-09 DIAGNOSIS — Z8542 Personal history of malignant neoplasm of other parts of uterus: Secondary | ICD-10-CM | POA: Insufficient documentation

## 2021-02-09 DIAGNOSIS — I10 Essential (primary) hypertension: Secondary | ICD-10-CM | POA: Insufficient documentation

## 2021-02-09 DIAGNOSIS — E1151 Type 2 diabetes mellitus with diabetic peripheral angiopathy without gangrene: Secondary | ICD-10-CM | POA: Diagnosis not present

## 2021-02-09 DIAGNOSIS — Z90722 Acquired absence of ovaries, bilateral: Secondary | ICD-10-CM | POA: Diagnosis not present

## 2021-02-09 DIAGNOSIS — Z90710 Acquired absence of both cervix and uterus: Secondary | ICD-10-CM | POA: Diagnosis not present

## 2021-02-09 DIAGNOSIS — Z79890 Hormone replacement therapy (postmenopausal): Secondary | ICD-10-CM | POA: Diagnosis not present

## 2021-02-09 DIAGNOSIS — Z79899 Other long term (current) drug therapy: Secondary | ICD-10-CM | POA: Diagnosis not present

## 2021-02-09 DIAGNOSIS — E785 Hyperlipidemia, unspecified: Secondary | ICD-10-CM | POA: Insufficient documentation

## 2021-02-09 DIAGNOSIS — Z7982 Long term (current) use of aspirin: Secondary | ICD-10-CM | POA: Diagnosis not present

## 2021-02-09 DIAGNOSIS — E039 Hypothyroidism, unspecified: Secondary | ICD-10-CM | POA: Diagnosis not present

## 2021-02-09 DIAGNOSIS — N941 Unspecified dyspareunia: Secondary | ICD-10-CM | POA: Diagnosis not present

## 2021-02-09 DIAGNOSIS — Z794 Long term (current) use of insulin: Secondary | ICD-10-CM | POA: Insufficient documentation

## 2021-02-09 DIAGNOSIS — K5900 Constipation, unspecified: Secondary | ICD-10-CM | POA: Diagnosis not present

## 2021-02-09 DIAGNOSIS — C541 Malignant neoplasm of endometrium: Secondary | ICD-10-CM

## 2021-02-09 DIAGNOSIS — Z780 Asymptomatic menopausal state: Secondary | ICD-10-CM | POA: Diagnosis not present

## 2021-02-09 NOTE — Patient Instructions (Signed)
It was good to see you today!  I do not see or feel any evidence of cancer recurrence on your exam.  I will see you in 6 months for follow-up.  As always, please call if you develop any new or concerning symptoms before your next visit.

## 2021-04-04 ENCOUNTER — Other Ambulatory Visit: Payer: Self-pay | Admitting: Adult Health

## 2021-05-24 ENCOUNTER — Ambulatory Visit (INDEPENDENT_AMBULATORY_CARE_PROVIDER_SITE_OTHER): Payer: 59 | Admitting: Adult Health

## 2021-05-24 ENCOUNTER — Other Ambulatory Visit: Payer: Self-pay

## 2021-05-24 ENCOUNTER — Encounter: Payer: Self-pay | Admitting: Adult Health

## 2021-05-24 VITALS — BP 100/66 | HR 72 | Temp 97.3°F | Ht 64.0 in | Wt 149.4 lb

## 2021-05-24 DIAGNOSIS — H5702 Anisocoria: Secondary | ICD-10-CM

## 2021-05-24 DIAGNOSIS — Z136 Encounter for screening for cardiovascular disorders: Secondary | ICD-10-CM

## 2021-05-24 DIAGNOSIS — E1122 Type 2 diabetes mellitus with diabetic chronic kidney disease: Secondary | ICD-10-CM

## 2021-05-24 DIAGNOSIS — Z86010 Personal history of colonic polyps: Secondary | ICD-10-CM

## 2021-05-24 DIAGNOSIS — I10 Essential (primary) hypertension: Secondary | ICD-10-CM | POA: Diagnosis not present

## 2021-05-24 DIAGNOSIS — E890 Postprocedural hypothyroidism: Secondary | ICD-10-CM

## 2021-05-24 DIAGNOSIS — Z79899 Other long term (current) drug therapy: Secondary | ICD-10-CM

## 2021-05-24 DIAGNOSIS — E1169 Type 2 diabetes mellitus with other specified complication: Secondary | ICD-10-CM

## 2021-05-24 DIAGNOSIS — R011 Cardiac murmur, unspecified: Secondary | ICD-10-CM

## 2021-05-24 DIAGNOSIS — D509 Iron deficiency anemia, unspecified: Secondary | ICD-10-CM

## 2021-05-24 DIAGNOSIS — E139 Other specified diabetes mellitus without complications: Secondary | ICD-10-CM

## 2021-05-24 DIAGNOSIS — M8588 Other specified disorders of bone density and structure, other site: Secondary | ICD-10-CM

## 2021-05-24 DIAGNOSIS — Z0000 Encounter for general adult medical examination without abnormal findings: Secondary | ICD-10-CM | POA: Diagnosis not present

## 2021-05-24 DIAGNOSIS — C541 Malignant neoplasm of endometrium: Secondary | ICD-10-CM

## 2021-05-24 DIAGNOSIS — Z1389 Encounter for screening for other disorder: Secondary | ICD-10-CM

## 2021-05-24 DIAGNOSIS — Z6825 Body mass index (BMI) 25.0-25.9, adult: Secondary | ICD-10-CM

## 2021-05-24 DIAGNOSIS — E559 Vitamin D deficiency, unspecified: Secondary | ICD-10-CM

## 2021-05-24 DIAGNOSIS — Z0001 Encounter for general adult medical examination with abnormal findings: Secondary | ICD-10-CM

## 2021-05-24 DIAGNOSIS — K5900 Constipation, unspecified: Secondary | ICD-10-CM

## 2021-05-24 NOTE — Patient Instructions (Addendum)
?Mary Frank , ?Thank you for taking time to come for your Annual Wellness Visit. I appreciate your ongoing commitment to your health goals. Please review the following plan we discussed and let me know if I can assist you in the future.  ? ?These are the goals we discussed: ? Goals   ? ?  HEMOGLOBIN A1C < 7.0   ?  LDL CALC < 70   ? ?  ?  ?This is a list of the screening recommended for you and due dates:  ?Health Maintenance  ?Topic Date Due  ? COVID-19 Vaccine (4 - Booster) 07/04/2020  ? Hemoglobin A1C  11/23/2020  ? Mammogram  10/06/2021  ? Colon Cancer Screening  11/24/2021  ? Eye exam for diabetics  01/31/2022  ? Complete foot exam   05/25/2022  ? Pap Smear  10/06/2022  ? Tetanus Vaccine  04/05/2026  ? Flu Shot  Completed  ? Hepatitis C Screening: USPSTF Recommendation to screen - Ages 70-79 yo.  Completed  ? HIV Screening  Completed  ? Zoster (Shingles) Vaccine  Completed  ? HPV Vaccine  Aged Out  ? ?Know what a healthy weight is for you (roughly BMI <25) and aim to maintain this ? ?Aim for 7+ servings of fruits and vegetables daily ? ?65-80+ fluid ounces of water or unsweet tea for healthy kidneys ? ?Limit to max 1 drink of alcohol per day; avoid smoking/tobacco ? ?Limit animal fats in diet for cholesterol and heart health - choose grass fed whenever available ? ?Avoid highly processed foods, and foods high in saturated/trans fats ? ?Aim for low stress - take time to unwind and care for your mental health ? ?Aim for 150 min of moderate intensity exercise weekly for heart health, and weights twice weekly for bone health ? ?Aim for 7-9 hours of sleep daily ? ? ? ? ?High-Fiber Eating Plan ?Fiber, also called dietary fiber, is a type of carbohydrate. It is found foods such as fruits, vegetables, whole grains, and beans. A high-fiber diet can have many health benefits. Your health care provider may recommend a high-fiber diet to help: ?Prevent constipation. Fiber can make your bowel movements more regular. ?Lower  your cholesterol. ?Relieve the following conditions: ?Inflammation of veins in the anus (hemorrhoids). ?Inflammation of specific areas of the digestive tract (uncomplicated diverticulosis). ?A problem of the large intestine, also called the colon, that sometimes causes pain and diarrhea (irritable bowel syndrome, or IBS). ?Prevent overeating as part of a weight-loss plan. ?Prevent heart disease, type 2 diabetes, and certain cancers. ?What are tips for following this plan? ?Reading food labels ? ?Check the nutrition facts label on food products for the amount of dietary fiber. Choose foods that have 5 grams of fiber or more per serving. ?The goals for recommended daily fiber intake include: ?Men (age 69 or younger): 34-38 g. ?Men (over age 67): 28-34 g. ?Women (age 4 or younger): 25-28 g. ?Women (over age 29): 22-25 g. ?Your daily fiber goal is _____________ g. ?Shopping ?Choose whole fruits and vegetables instead of processed forms, such as apple juice or applesauce. ?Choose a wide variety of high-fiber foods such as avocados, lentils, oats, and kidney beans. ?Read the nutrition facts label of the foods you choose. Be aware of foods with added fiber. These foods often have high sugar and sodium amounts per serving. ?Cooking ?Use whole-grain flour for baking and cooking. ?Cook with brown rice instead of white rice. ?Meal planning ?Start the day with a breakfast that is  high in fiber, such as a cereal that contains 5 g of fiber or more per serving. ?Eat breads and cereals that are made with whole-grain flour instead of refined flour or white flour. ?Eat brown rice, bulgur wheat, or millet instead of white rice. ?Use beans in place of meat in soups, salads, and pasta dishes. ?Be sure that half of the grains you eat each day are whole grains. ?General information ?You can get the recommended daily intake of dietary fiber by: ?Eating a variety of fruits, vegetables, grains, nuts, and beans. ?Taking a fiber supplement if  you are not able to take in enough fiber in your diet. It is better to get fiber through food than from a supplement. ?Gradually increase how much fiber you consume. If you increase your intake of dietary fiber too quickly, you may have bloating, cramping, or gas. ?Drink plenty of water to help you digest fiber. ?Choose high-fiber snacks, such as berries, raw vegetables, nuts, and popcorn. ?What foods should I eat? ?Fruits ?Berries. Pears. Apples. Oranges. Avocado. Prunes and raisins. Dried figs. ?Vegetables ?Sweet potatoes. Spinach. Kale. Artichokes. Cabbage. Broccoli. Cauliflower. Green peas. Carrots. Squash. ?Grains ?Whole-grain breads. Multigrain cereal. Oats and oatmeal. Brown rice. Barley. Bulgur wheat. [place]. Quinoa. Bran muffins. Popcorn. Rye wafer crackers. ?Meats and other proteins ?Navy beans, kidney beans, and pinto beans. Soybeans. Split peas. Lentils. Nuts and seeds. ?Dairy ?Fiber-fortified yogurt. ?Beverages ?Fiber-fortified soy milk. Fiber-fortified orange juice. ?Other foods ?Fiber bars. ?The items listed above may not be a complete list of recommended foods and beverages. Contact a dietitian for more information. ?What foods should I avoid? ?Fruits ?Fruit juice. Cooked, strained fruit. ?Vegetables ?Fried potatoes. Canned vegetables. Well-cooked vegetables. ?Grains ?White bread. Pasta made with refined flour. White rice. ?Meats and other proteins ?Fatty cuts of meat. Fried chicken or fried fish. ?Dairy ?Milk. Yogurt. Cream cheese. Sour cream. ?Fats and oils ?Butters. ?Beverages ?Soft drinks. ?Other foods ?Cakes and pastries. ?The items listed above may not be a complete list of foods and beverages to avoid. Talk with your dietitian about what choices are best for you. ?Summary ?Fiber is a type of carbohydrate. It is found in foods such as fruits, vegetables, whole grains, and beans. ?A high-fiber diet has many benefits. It can help to prevent constipation, lower blood cholesterol, aid weight loss,  and reduce your risk of heart disease, diabetes, and certain cancers. ?Increase your intake of fiber gradually. Increasing fiber too quickly may cause cramping, bloating, and gas. Drink plenty of water while you increase the amount of fiber you consume. ?The best sources of fiber include whole fruits and vegetables, whole grains, nuts, seeds, and beans. ?This information is not intended to replace advice given to you by your health care provider. Make sure you discuss any questions you have with your health care provider. ?Document Revised: 06/24/2019 Document Reviewed: 06/24/2019 ?Elsevier Patient Education ? [place]. ? ?

## 2021-05-24 NOTE — Progress Notes (Signed)
Complete Physical ? ?Assessment and Plan: ? ?Diagnoses and all orders for this visit: ? ?Encounter for Annual Physical Exam with abnormal findings ?Due annually  ?Health Maintenance reviewed ?Healthy lifestyle reviewed and goals set ? ?Essential hypertension ?At goal; continue medication ?Monitor blood pressure at home; call if consistently over 130/80 ?Continue DASH diet.   ?Reminder to go to the ER if any CP, SOB, nausea, dizziness, severe HA, changes vision/speech, left arm numbness and tingling and jaw pain. ?-     EKG 12-Lead ? ?Varicose veins of lower extremities with complications, unspecified laterality ?S/p vein ligation and stripping; recommended wearing support hose; vascular continues to follow ? ?Postablative hypothyroidism ?continue medications the same pending lab results ?reminded to take on an empty stomach 30-40mns before food.  ?check TSH level ?-     TSH ? 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 of diabetes management (respective goals in parentheses):  A1C (<7), blood pressure (<130/80), and cholesterol (LDL <70) ?Eye Exam yearly and Dental Exam every 6 months; UTD ?Dietary recommendations reviewed ?Physical Activity recommendations reviewed ?Foot exam performed ?-     Hemoglobin A1c ? ?Systolic murmur ?Mild, intermittent, benign characteristics; not heard today; monitor  ? ?Medication management ?-     CBC with Differential/Platelet ?-     CMP/GFR ?-     Magnesium  ? ?Mixed hyperlipidemia ?Continue medications: atorvastatin ?LDL goal <70 ?Continue low cholesterol diet and exercise.  ?Check lipid panel.  ?-     Lipid panel ? ?Vitamin D deficiency ?Continue to recommend supplementation for goal of 70-100 ?Check vitamin D level ? ?BMI 25 ?Continue to recommend diet heavy in fruits and veggies and low in animal meats, cheeses, and dairy products, appropriate calorie intake ?Discuss exercise recommendations  routinely ?Continue to monitor weight at each visit ? ?Screening for hematuria or proteinuria ?-     Microalbumin / creatinine urine ratio ?-     Urinalysis w microscopic + reflex cultur ? ?History of colon polyps;/ Colon cancer screening  ?GI planning follow up 11/2021 due to inadequate prep ?High fiber diet information given ? ?Endometrial adenocarcinoma ([place] ?S/p TAH, plan to monitor [id number] see Dr. RoKathrine HaddockNeNori Riislternating; no adjunct therapy was recommended.  ? ?Osteopenia of spine ?GYN follows ?DEXA in 2022 by GYN per patient, report requested  ?Discuss Vit D and Ca, weight bearing exercises ? ?Anisocoria  ?Stable, benign per ophth ? ? ?Orders Placed This Encounter  ?Procedures  ? CBC with Differential/Platelet  ? COMPLETE METABOLIC PANEL WITH GFR  ? Magnesium  ? Lipid panel  ? TSH  ? Hemoglobin A1c  ? VITAMIN D 25 Hydroxy (Vit-D Deficiency, Fractures)  ? Microalbumin / creatinine urine ratio  ? Urinalysis, Routine w reflex microscopic  ? Iron, TIBC and Ferritin Panel  ? EKG 12-Lead  ? HM DIABETES FOOT EXAM  ? ? ?Discussed med's effects and SE's. Screening labs and tests as requested with regular follow-up as recommended. ?Over 40 minutes of exam, counseling, chart review, and complex, high level critical decision making was performed this visit.  ? ?Future Appointments  ?Date Time Provider [place]?08/17/2021  1:00 PM TuLafonda MossesMD [facility] None  ?05/28/2022 10:00 AM CoLiane ComberNP [facility] None  ? ? ? ?HPI  ?Mary Frank. very pleasant married Caucasian female, presents for a complete physical and follow up for has Essential hypertension; Hyperlipidemia associated with type 2 diabetes mellitus ([place] LADA (latent autoimmune diabetes  in adults), managed as type 1 ([place]); Hypothyroidism; Vitamin D deficiency; Medication management; Systolic murmur; CKD stage 2 due to type 2 diabetes mellitus ([place]); Osteopenia; Endometrial adenocarcinoma ([place]); Anisocoria; Constipation; and History of  adenomatous polyp of colon on their problem list.  ? ?She is married, recently retired in 2021 from [employer], mother of 2, 4 grand kids in [place] and [place]. No concerns today. ? ?Followed by Dr. [name] for GYN and mammograms.  ?Osteopenia of spine per DEXA  at GYN office, had DEXA in 2022 per patient they are monitoring.  ? ?She was found to have endometrial cancer in 2021 and on 12/13/2019 underwent robotic assisted hysterectomy with bilateral salpingooopherecomy with sentinel lymph node biopsy by Dr. Everitt Amber. Pathology revealed a low risk grade 1 stage Ia endometrial adenocarcinoma.  No adjuvant therapy was recommended. Plan to follow up q46mx 5 year, alternating with Dr. NNori Riisand Dr. TBerline Lopes  ? ?BMI is Body mass index is 25.64 kg/m?., she has been working on diet and exercise. Works out for an hour most days.  ?Has been following 1500 kcal diet, general healthy.  ?She drinks 2 glasses of wine weekly.  ?She drinks 1/2+ gallon of water daily. ?She drinks 1 cups of coffee daily, switched to decaf  ?Very rarely will drink soda.  ?Wt Readings from Last 3 Encounters:  ?05/24/21 149 lb 6.4 oz (67.8 kg)  ?02/09/21 145 lb 12.8 oz (66.1 kg)  ?12/20/20 146 lb (66.2 kg)  ? ?Her blood pressure has been controlled at home, today their BP is BP: 100/66  ?She does workout. She denies chest pain, shortness of breath, dizziness.  ? ?She is on cholesterol medication (atorvastatin 80 mg daily) and denies myalgias. Her cholesterol is not at goal of LDL <70. The cholesterol last visit was:   ?Lab Results  ?Component Value Date  ? CHOL 165 12/20/2020  ? HDL 71 12/20/2020  ? [place]76 12/20/2020  ? TRIG 96 12/20/2020  ? CHOLHDL 2.3 12/20/2020  ? ?She is followed [facility] HBurney Gauze[facility] for LADA as she was found to have elevated insulin antibodies.   ?Patient take a base of 12 units of glargine daily and covers bid with small doses (1-3 units) of humalog with each meal.  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  ?She has been working on diet and exercise for LADA diabetes. she is on bASA, she is on ACE/ARB and denies foot ulcerations, hyperglycemia, increased appetite, nausea, paresthesia of the feet, polydipsia, polyuria, visual disturbances, vomiting and weight loss.  ?Last A1C in the office was:  ?Lab Results  ?Component Value Date  ? HGBA1C 6.1 (H) 05/23/2020  ? ?She is on thyroid medication. Her medication was not changed last visit.   ?Lab Results  ?Component Value Date  ? TSH 1.04 12/20/2020  ? ?She has CKD 1/2 monitored at this office:  ?Lab Results  ?Component Value Date  ? EGFR 91 12/20/2020  ? ?Lab Results  ?Component Value Date  ? MICRALBCREAT 8 05/23/2020  ? MICRALBCREAT 4 05/21/2019  ? MICRALBCREAT 3 05/15/2018  ? ?Patient is on Vitamin D supplement and at goal at recent check:    ?Lab Results  ?Component Value Date  ? VD25OH 91 05/23/2020  ?   ?She has been on iron daily for hx of iron def anemia;  ?Lab Results  ?Component Value Date  ? IRON 79 05/23/2020  ? TIBC 245 (L) 05/23/2020  ? FERRITIN 56 05/23/2020  ? ? ? ?  Current Medications:  ?Current Outpatient Medications on File Prior to Visit  ?Medication Sig Dispense Refill  ? aspirin EC 81 MG tablet Take 1 tablet (81 mg total) by mouth daily. Do not take for one week post-op. Swallow whole. 30 tablet 11  ? atorvastatin (LIPITOR) 80 MG tablet TAKE 1 TABLET BY MOUTH DAILY FOR CHOLESTEROL 90 tablet 33  ? Calcium Citrate-Vitamin D (CALCIUM CITRATE + D3 PO) Take 600 mg by mouth daily.     ? Cholecalciferol 125 MCG (5000 UT) TABS Take 5,000 Units by mouth daily.    ? Continuous Blood Gluc Sensor (FREESTYLE LIBRE 2 SENSOR) MISC Inject 1 sensor to the skin every 14 days for continuous glucose monitoring.    ? estradiol (ESTRACE) 2 MG tablet Take 2 mg by mouth daily.    ? ferrous fumarate (HEMOCYTE - 106 MG FE) 325 (106 FE) MG TABS Take 1 tablet by mouth daily.    ? insulin glargine (LANTUS) 100 UNIT/ML Solostar Pen Inject 10-16 Units into the skin daily.     ?  insulin lispro (HUMALOG KWIKPEN) 100 UNIT/ML KwikPen Inject 0-4 Units into the skin 3 (three) times daily before meals. Sliding scale: ?under 120 = 0 units  ?120 - 170 = 1 units  ?171 - 220 = 2 units  ?221 - 2

## 2021-05-25 LAB — COMPLETE METABOLIC PANEL WITH GFR
AG Ratio: 1.8 (calc) (ref 1.0–2.5)
ALT: 26 U/L (ref 6–29)
AST: 20 U/L (ref 10–35)
Albumin: 4.3 g/dL (ref 3.6–5.1)
Alkaline phosphatase (APISO): 74 U/L (ref 37–153)
BUN: 23 mg/dL (ref 7–25)
CO2: 27 mmol/L (ref 20–32)
Calcium: 9.9 mg/dL (ref 8.6–10.4)
Chloride: 104 mmol/L (ref 98–110)
Creat: 0.75 mg/dL (ref 0.50–1.05)
Globulin: 2.4 g/dL (calc) (ref 1.9–3.7)
Glucose, Bld: 81 mg/dL (ref 65–99)
Potassium: 4.4 mmol/L (ref 3.5–5.3)
Sodium: 140 mmol/L (ref 135–146)
Total Bilirubin: 0.5 mg/dL (ref 0.2–1.2)
Total Protein: 6.7 g/dL (ref 6.1–8.1)
eGFR: 89 mL/min/{1.73_m2} (ref >=60)

## 2021-05-25 LAB — IRON,TIBC AND FERRITIN PANEL
%SAT: 48 % (calc) — ABNORMAL HIGH (ref 16–45)
Ferritin: 47 ng/mL (ref 16–288)
Iron: 143 ug/dL (ref 45–160)
TIBC: 296 mcg/dL (calc) (ref 250–450)

## 2021-05-25 LAB — MICROALBUMIN / CREATININE URINE RATIO
Creatinine, Urine: 91 mg/dL (ref 20–275)
Microalb, Ur: <0.2 mg/dL

## 2021-05-25 LAB — LIPID PANEL
Cholesterol: 164 mg/dL (ref <200)
HDL: 72 mg/dL (ref >=50)
LDL Cholesterol (Calc): 77 mg/dL (calc)
Non-HDL Cholesterol (Calc): 92 mg/dL (calc) (ref <130)
Total CHOL/HDL Ratio: 2.3 (calc) (ref <5.0)
Triglycerides: 73 mg/dL (ref <150)

## 2021-05-25 LAB — CBC WITH DIFFERENTIAL/PLATELET
Absolute Monocytes: 432 cells/uL (ref 200–950)
Basophils Absolute: 52 cells/uL (ref 0–200)
Basophils Relative: 1 %
Eosinophils Absolute: 78 cells/uL (ref 15–500)
Eosinophils Relative: 1.5 %
HCT: 45 % (ref 35.0–45.0)
Hemoglobin: 14.7 g/dL (ref 11.7–15.5)
Lymphs Abs: 1591 cells/uL (ref 850–3900)
MCH: 28.1 pg (ref 27.0–33.0)
MCHC: 32.7 g/dL (ref 32.0–36.0)
MCV: 85.9 fL (ref 80.0–100.0)
MPV: 11.1 fL (ref 7.5–12.5)
Monocytes Relative: 8.3 %
Neutro Abs: 3047 cells/uL (ref 1500–7800)
Neutrophils Relative %: 58.6 %
Platelets: 233 10*3/uL (ref 140–400)
RBC: 5.24 10*6/uL — ABNORMAL HIGH (ref 3.80–5.10)
RDW: 12.3 % (ref 11.0–15.0)
Total Lymphocyte: 30.6 %
WBC: 5.2 10*3/uL (ref 3.8–10.8)

## 2021-05-25 LAB — URINALYSIS, ROUTINE W REFLEX MICROSCOPIC
Bilirubin Urine: NEGATIVE
Glucose, UA: NEGATIVE
Hgb urine dipstick: NEGATIVE
Ketones, ur: NEGATIVE
Leukocytes,Ua: NEGATIVE
Nitrite: NEGATIVE
Protein, ur: NEGATIVE
Specific Gravity, Urine: 1.02 (ref 1.001–1.035)
pH: 6 (ref 5.0–8.0)

## 2021-05-25 LAB — TSH: TSH: 1.96 mIU/L (ref 0.40–4.50)

## 2021-05-25 LAB — HEMOGLOBIN A1C
Hgb A1c MFr Bld: 6.6 % of total Hgb — ABNORMAL HIGH (ref <5.7)
Mean Plasma Glucose: 143 mg/dL
eAG (mmol/L): 7.9 mmol/L

## 2021-05-25 LAB — VITAMIN D 25 HYDROXY (VIT D DEFICIENCY, FRACTURES): Vit D, 25-Hydroxy: 79 ng/mL (ref 30–100)

## 2021-05-25 LAB — MAGNESIUM: Magnesium: 2 mg/dL (ref 1.5–2.5)

## 2021-06-12 ENCOUNTER — Other Ambulatory Visit: Payer: Self-pay | Admitting: Adult Health

## 2021-06-12 DIAGNOSIS — E890 Postprocedural hypothyroidism: Secondary | ICD-10-CM

## 2021-06-18 LAB — HM DEXA SCAN

## 2021-06-20 ENCOUNTER — Encounter: Payer: Self-pay | Admitting: Internal Medicine

## 2021-07-31 ENCOUNTER — Telehealth: Payer: Self-pay | Admitting: *Deleted

## 2021-07-31 NOTE — Telephone Encounter (Signed)
Called and moved the patient's appt to an earlier time on 6/16

## 2021-08-14 ENCOUNTER — Encounter: Payer: Self-pay | Admitting: Gynecologic Oncology

## 2021-08-16 NOTE — Patient Instructions (Addendum)
It was good to see you today. I don't see or feel any evidence of cancer recurrence on your exam.  We will start alternating visits between my office and your OB/GYN.  Please make sure to see your OB/GYN at the end of this year, in November or December.  Please call me sometime after the beginning of the new year to schedule a visit to see me in June 2024.  As always, please call if you develop any new and concerning symptoms before your next visit.

## 2021-08-16 NOTE — Progress Notes (Signed)
Gynecologic Oncology Return Clinic Visit  08/17/21  Reason for Visit: Surveillance visit in the setting of early stage uterine cancer  Treatment History: Oncology History  Endometrial adenocarcinoma ([place])  10/05/2019 Imaging   Pelvic US: Uterus 11.6 x 7.3 x 8.3 cm with an endometrial lining measuring 1.5 cm.  Several uterine fibroids noted, the largest measuring 6.4 x 5.8 cm.   11/18/2019 Initial Biopsy   D&C: Endometrial adenocarcinoma, FIGO grade 1   11/24/2019 Initial Diagnosis   Endometrial adenocarcinoma ([place])   12/13/2019 Surgery   Robotic-assisted laparoscopic total hysterectomy >250gm with bilateral salpingoophorectomy, SLN biopsy, minilaparotomy  Findings: enlarged 500+gm uterus, normal tubes and ovaries, no gross extratuerine disease, no apparently suspicious lymph nodes.    12/13/2019 Pathology Results   A. SENTINEL LYMPH NODE, RIGHT EXTERNAL ILIAC, EXCISION:  - One lymph node with no metastatic carcinoma (0/1).   B. SENTINEL LYMPH NODE, LEFT EXTERNAL ILIAC, EXCISION:  - One lymph node with no metastatic carcinoma (0/1).   C. UTERUS, CERVIX, BILATERAL FALLOPIAN TUBES AND OVARIES:  - Endometrium:       Microscopic focus of endometrioid adenocarcinoma.       Inactive endometrium and extensive biopsy changes.       No myometrial invasion.       See oncology table.  - Cervix:       Slight cervicitis and nabothian cyst.       No dysplasia or evidence of malignancy.  - Myometrium:       Leiomyomata.       No evidence of malignancy.  - Right and left ovaries:       Benign serous cysts.       No endometriosis or evidence of malignancy.  - Right and left fallopian tubes:       Benign paratubal cyst.       No endometriosis or evidence of malignancy.   ONCOLOGY TABLE:  UTERUS, CARCINOMA OR CARCINOSARCOMA  Procedure: Total hysterectomy and bilateral salpingo-oophorectomy.  Histologic type: Endometrioid.  Histologic Grade: FIGO grade 1.  Myometrial invasion:        Depth of invasion: 0 mm       Myometrial thickness: 15 mm  Uterine Serosa Involvement: Not present.  Cervical stromal involvement: Not present.  Extent of involvement of other organs: Not present.  Lymphovascular invasion: Not present.  Regional Lymph Nodes:       Examined:     2 Sentinel                               0 non-sentinel                               2 total        Lymph nodes with metastasis: 0        Isolated tumor cells (<0.2 mm): 0        Micrometastasis:  (>0.2 mm and < 2.0 mm): 0        Macrometastasis: (>2.0 mm): 0       Representative Tumor Block: C7  MMR / MSI testing: Insufficient residual tumor.  Pathologic Stage Classification (pTNM, AJCC 8th edition):  pT1a, pN0  Comments: The endometrium shows extensive biopsy changes and there is  focal inactive endometrium.  There is a microscopic focus of cribriform  glands with atypia consistent with minimal residual FIGO grade 1  endometrioid carcinoma.  12/13/2019 Cancer Staging   Staging form: Corpus Uteri - Carcinoma and Carcinosarcoma, AJCC 8th Edition - Clinical stage from 12/13/2019: FIGO Stage IA (cT1a, cN0(sn), cM0) - Signed by [name], MD on 12/21/2019     Interval History: Reports doing well.  Bowel function has improved.  She continues to use MiraLAX but is now working on [employer] and adding dietary fiber.  She denies any urinary symptoms.  Denies any vaginal bleeding or discharge.  Denies any abdominal or pelvic pain.  Spent some time in [place] in February and is looking forward to traveling to [place] in July and August.  Past Medical/Surgical History: Past Medical History:  Diagnosis Date   Diabetes mellitus without complication ([place])    Endometrial adenocarcinoma ([place])    Hyperlipidemia    Hypertension    Hypothyroidism    Iron deficiency anemia 01/17/2011   Menopause    Peripheral vascular disease ([place]) 1990   stripping    Thyroid disease    Type II or  unspecified type diabetes mellitus without mention of complication, not stated as uncontrolled 02/04/2013   Varicose veins    Varicose veins of lower extremities with complications 84/69/6295   Vitamin D deficiency     Past Surgical History:  Procedure Laterality Date   COLONOSCOPY  12/13/2008   Dr.Brodie   ENDOMETRIAL ABLATION W/ NOVASURE  2015   Dr. [name]   ROBOTIC ASSISTED TOTAL HYSTERECTOMY WITH BILATERAL SALPINGO OOPHERECTOMY Bilateral 12/13/2019   Procedure: XI ROBOTIC ASSISTED TOTAL HYSTERECTOMY WITH BILATERAL SALPINGO OOPHORECTOMY (SPECIMEN WEIGHING >250 GRAMS), MINI LAPAROTOMY;  Surgeon: Everitt Amber, MD;  Location: [facility];  Service: Gynecology;  Laterality: Bilateral;   SENTINEL NODE BIOPSY N/A 12/13/2019   Procedure: SENTINEL LYMPH NODE BIOPSY;  Surgeon: Everitt Amber, MD;  Location: [facility];  Service: Gynecology;  Laterality: N/A;   VEIN LIGATION AND STRIPPING Left 2017   VEIN LIGATION AND STRIPPING Right 2017    Family History  Problem Relation Age of Onset   Diabetes Mother    Fibromyalgia Mother    Arrhythmia Mother        Required chest compressions -   Osteoporosis Mother    Colon polyps Father    Thyroid disease Father        hypothyroid   Heart attack Father 24   Thyroid disease Sister    Osteoporosis Sister    Breast cancer Maternal Grandmother 38   Anxiety disorder Daughter    Colitis Daughter    Depression Son    Irritable bowel syndrome Son    Colon cancer Neg Hx    Esophageal cancer Neg Hx    Rectal cancer Neg Hx    Stomach cancer Neg Hx    Ovarian cancer Neg Hx    Endometrial cancer Neg Hx    Pancreatic cancer Neg Hx    Prostate cancer Neg Hx     Social History   Socioeconomic History   Marital status: Married    Spouse name: Not on file   Number of children: 2   Years of education: 14   Highest education level: High school graduate  Occupational History   Not on file  Tobacco Use   Smoking status:  Former    Packs/day: 1.00    Years: 20.00    Total pack years: 20.00    Types: Cigarettes    Start date: 03/21/1991    Quit date: 03/20/2010    Years since quitting: 34.4  Smokeless tobacco: Never  Vaping Use   Vaping Use: Never used  Substance and Sexual Activity   Alcohol use: Yes    Alcohol/week: 2.0 standard drinks of alcohol    Types: 2 Glasses of wine per week    Comment: 2 glasses a wine once a week   Drug use: No   Sexual activity: Yes    Partners: Male    Birth control/protection: Surgical, Post-menopausal  Other Topics Concern   Not on file  Social History Narrative   Not on file   Social Determinants of Health   Financial Resource Strain: Not on file  Food Insecurity: Not on file  Transportation Needs: Not on file  Physical Activity: Sufficiently Active (05/09/2017)   Exercise Vital Sign    Days of Exercise per Week: 7 days    Minutes of Exercise per Session: 60 min  Stress: No Stress Concern Present (05/09/2017)   [place]    Feeling of Stress : Not at all  Social Connections: Not on file    Current Medications:  Current Outpatient Medications:    aspirin EC 81 MG tablet, Take 1 tablet (81 mg total) by mouth daily. Do not take for one week post-op. Swallow whole., Disp: 30 tablet, Rfl: 11   atorvastatin (LIPITOR) 80 MG tablet, TAKE 1 TABLET BY MOUTH DAILY FOR CHOLESTEROL, Disp: 90 tablet, Rfl: 33   Calcium Citrate-Vitamin D (CALCIUM CITRATE + D3 PO), Take 600 mg by mouth daily. , Disp: , Rfl:    Cholecalciferol 125 MCG (5000 UT) TABS, Take 5,000 Units by mouth daily., Disp: , Rfl:    Continuous Blood Gluc Sensor (FREESTYLE LIBRE 2 SENSOR) MISC, Inject 1 sensor to the skin every 14 days for continuous glucose monitoring., Disp: , Rfl:    estradiol (ESTRACE) 2 MG tablet, Take 2 mg by mouth daily., Disp: , Rfl:    ferrous fumarate (HEMOCYTE - 106 MG FE) 325 (106 FE) MG TABS, Take 1 tablet by mouth  daily., Disp: , Rfl:    insulin glargine (LANTUS) 100 UNIT/ML Solostar Pen, Inject 10-16 Units into the skin daily. , Disp: , Rfl:    insulin lispro (HUMALOG KWIKPEN) 100 UNIT/ML KwikPen, Inject 0-4 Units into the skin 3 (three) times daily before meals. Sliding scale: under 120 = 0 units  120 - 170 = 1 units  171 - 220 = 2 units  221 - 270 = 3 units  271 - 320 = 4 units  over 320 = 4 units, Disp: , Rfl:    levothyroxine (SYNTHROID) 137 MCG tablet, TAKE 1 TABLET BY MOUTH DAILY EXCEPT TAKE1/2 TABLET ON SUNDAY...TAKE 30-60 MINUTES PRIOR TO BREAKFAST ON EMPTY STOMACH WITH WATER ONLY, Disp: 90 tablet, Rfl: 3   lisinopril (ZESTRIL) 2.5 MG tablet, Take 1 tablet Daily for BP & Diabetic Kidney Protection, Disp: 90 tablet, Rfl: 3   Magnesium 500 MG CAPS, Take 500 mg by mouth daily., Disp: , Rfl:    Omega-3 Fatty Acids (FISH OIL) 1200 MG CAPS, Take 1 capsule (1,200 mg total) by mouth 2 (two) times daily., Disp: , Rfl:    Polyethylene Glycol 3350 (MIRALAX PO), Take by mouth daily., Disp: , Rfl:    senna-docusate (SENOKOT-S) 8.6-50 MG tablet, Take 2 tablets by mouth at bedtime. For AFTER surgery, do not take if having diarrhea, Disp: 30 tablet, Rfl: 0  Review of Systems: Denies appetite changes, fevers, chills, fatigue, unexplained weight changes. Denies hearing loss, neck lumps or masses,  mouth sores, ringing in ears or voice changes. Denies cough or wheezing.  Denies shortness of breath. Denies chest pain or palpitations. Denies leg swelling. Denies abdominal distention, pain, blood in stools, constipation, diarrhea, nausea, vomiting, or early satiety. Denies pain with intercourse, dysuria, frequency, hematuria or incontinence. Denies hot flashes, pelvic pain, vaginal bleeding or vaginal discharge.   Denies joint pain, back pain or muscle pain/cramps. Denies itching, rash, or wounds. Denies dizziness, headaches, numbness or seizures. Denies swollen lymph nodes or glands, denies easy bruising or  bleeding. Denies anxiety, depression, confusion, or decreased concentration.  Physical Exam: BP 118/80 (BP Location: Left Arm, Patient Position: Sitting)   Pulse (!) 58   Temp 98 F (36.7 C) (Oral)   Resp 16   Ht '5\' 4"'  (1.626 m)   Wt 144 lb 12.8 oz (65.7 kg)   LMP 08/11/2013   SpO2 97%   BMI 24.85 kg/m  General: Alert, oriented, no acute distress. HEENT: Normocephalic, atraumatic, sclera anicteric. Chest: Clear to auscultation bilaterally.  No wheezes or rhonchi. Cardiovascular: Regular rate and rhythm, no murmurs. Abdomen: soft, nontender.  Normoactive bowel sounds.  No masses or hepatosplenomegaly appreciated.  Well-healed incisions. Extremities: Grossly normal range of motion.  Warm, well perfused.  No edema bilaterally. Skin: No rashes or lesions noted. Lymphatics: No cervical, supraclavicular, or inguinal adenopathy. GU: Normal appearing external genitalia without erythema, excoriation, or lesions.  Speculum exam reveals well rugated vaginal mucosa, no lesions or masses.  No bleeding or discharge.  Cuff intact.  Bimanual exam reveals cuff intact, no nodularity or masses.  Rectovaginal exam confirms these findings.  Laboratory & Radiologic Studies: None new  Assessment & Plan: Mary Frank is a 66 y.o. woman with Stage IA grade 1 who presents for surveillance visit in the setting of low risk early stage endometrial cancer.  Surgery completed 12/2019.   Patient continues to do well and is NED on exam.      We discussed signs and symptoms again that would be concerning for disease recurrence.  The patient knows to call if she develops any of these before her next scheduled visit.  In line with NCCN surveillance recommendations, we will continue visits every 6 months for 5 years.  Per NCCN and SGO surveillance recommendations, Pap smear is not indicated in this patient.  Given low risk disease, I am recommending that we begin alternating visits between my office and her OB/GYN.  I  have asked her to schedule follow-up with her OB/GYN at the end of the year and to call my office for a visit this time next year.  22 minutes of total time was spent for this patient encounter, including preparation, face-to-face counseling with the patient and coordination of care, and documentation of the encounter.  Jeral Pinch, MD  Division of Gynecologic Oncology  Department of Obstetrics and Gynecology  [facility]

## 2021-08-17 ENCOUNTER — Encounter: Payer: Self-pay | Admitting: Gynecologic Oncology

## 2021-08-17 ENCOUNTER — Other Ambulatory Visit: Payer: Self-pay

## 2021-08-17 ENCOUNTER — Inpatient Hospital Stay: Payer: 59 | Attending: Gynecologic Oncology | Admitting: Gynecologic Oncology

## 2021-08-17 VITALS — BP 118/80 | HR 58 | Temp 98.0°F | Resp 16 | Ht 64.0 in | Wt 144.8 lb

## 2021-08-17 DIAGNOSIS — Z90710 Acquired absence of both cervix and uterus: Secondary | ICD-10-CM | POA: Insufficient documentation

## 2021-08-17 DIAGNOSIS — Z90722 Acquired absence of ovaries, bilateral: Secondary | ICD-10-CM | POA: Insufficient documentation

## 2021-08-17 DIAGNOSIS — C541 Malignant neoplasm of endometrium: Secondary | ICD-10-CM

## 2021-08-17 DIAGNOSIS — Z8542 Personal history of malignant neoplasm of other parts of uterus: Secondary | ICD-10-CM | POA: Diagnosis present

## 2021-11-14 ENCOUNTER — Encounter: Payer: Self-pay | Admitting: Internal Medicine

## 2021-11-23 ENCOUNTER — Ambulatory Visit (AMBULATORY_SURGERY_CENTER): Payer: Self-pay

## 2021-11-23 VITALS — Ht 64.0 in | Wt 153.0 lb

## 2021-11-23 DIAGNOSIS — Z86010 Personal history of colonic polyps: Secondary | ICD-10-CM

## 2021-11-23 MED ORDER — NA SULFATE-K SULFATE-MG SULF 17.5-3.13-1.6 GM/177ML PO SOLN: 1.0000 | Freq: Once | ORAL | 0 refills | Status: AC

## 2021-11-23 NOTE — Progress Notes (Signed)
No egg or soy allergy known to patient  No issues known to pt with past sedation with any surgeries or procedures Patient denies ever being told they had issues or difficulty with intubation  No FH of Malignant Hyperthermia Pt is not on diet pills Pt is not on  home 02  Pt is not on blood thinners  Pt denies issues with constipation  No A fib or A flutter Have any cardiac testing pending--no Pt instructed to use [email or web address] or GoodRx for a price reduction on prep   

## 2021-11-26 ENCOUNTER — Encounter: Payer: Self-pay | Admitting: Nurse Practitioner

## 2021-11-26 ENCOUNTER — Ambulatory Visit: Payer: 59 | Admitting: Nurse Practitioner

## 2021-11-26 VITALS — BP 106/70 | HR 60 | Temp 97.2°F | Ht 64.0 in | Wt 151.8 lb

## 2021-11-26 DIAGNOSIS — E785 Hyperlipidemia, unspecified: Secondary | ICD-10-CM

## 2021-11-26 DIAGNOSIS — H5702 Anisocoria: Secondary | ICD-10-CM

## 2021-11-26 DIAGNOSIS — Z79899 Other long term (current) drug therapy: Secondary | ICD-10-CM

## 2021-11-26 DIAGNOSIS — E139 Other specified diabetes mellitus without complications: Secondary | ICD-10-CM

## 2021-11-26 DIAGNOSIS — E559 Vitamin D deficiency, unspecified: Secondary | ICD-10-CM

## 2021-11-26 DIAGNOSIS — R011 Cardiac murmur, unspecified: Secondary | ICD-10-CM

## 2021-11-26 DIAGNOSIS — E890 Postprocedural hypothyroidism: Secondary | ICD-10-CM | POA: Diagnosis not present

## 2021-11-26 DIAGNOSIS — I10 Essential (primary) hypertension: Secondary | ICD-10-CM | POA: Diagnosis not present

## 2021-11-26 DIAGNOSIS — C541 Malignant neoplasm of endometrium: Secondary | ICD-10-CM

## 2021-11-26 DIAGNOSIS — I83899 Varicose veins of unspecified lower extremities with other complications: Secondary | ICD-10-CM | POA: Diagnosis not present

## 2021-11-26 DIAGNOSIS — E1169 Type 2 diabetes mellitus with other specified complication: Secondary | ICD-10-CM

## 2021-11-26 NOTE — Patient Instructions (Signed)

## 2021-11-26 NOTE — Progress Notes (Signed)
Follow Up  Assessment and Plan:  Diagnoses and all orders for this visit:  Essential hypertension Continue bASA, lisinopril Discussed DASH (Dietary Approaches to Stop Hypertension) DASH diet is lower in sodium than a typical American diet. Cut back on foods that are high in saturated fat, cholesterol, and trans fats. Eat more whole-grain foods, fish, poultry, and nuts Remain active and exercise as tolerated daily.  Monitor BP at home-Call if greater than 130/80.  Check CMP/CBC   Varicose veins of lower extremities with complications, unspecified laterality S/p vein ligation and stripping Continue compression stockings. Continue to follow with Vascular   Postablative hypothyroidism Controlled. Continue Levothyroxine. Reminded to take on an empty stomach 30-41mns before food.  Stop any Biotin Supplement 48-72 hours before next TSH level to reduce the risk of falsely low TSH levels. Continue to monitor.     LADA (latent autoimmune diabetes in adults), managed as type 1 ([place] Followed by endocrinology Education: Reviewed 'ABCs' of diabetes management  Discussed goals to be met and/or maintained include A1C (<7) Blood pressure (<130/80) Cholesterol (LDL <70) Continue Eye Exam yearly  Continue Dental Exam Q6 mo Discussed dietary recommendations Discussed Physical Activity recommendations Foot exam UTD Check AH4L  Systolic murmur Mild-intermittent benign characteristics Continue to monitor   Medication management All medications discussed and reviewed in full. All questions and concerns regarding medications addressed.    Mixed hyperlipidemia Discussed lifestyle modifications. Recommended diet heavy in fruits and veggies, omega 3's. Decrease consumption of animal meats, cheeses, and dairy products. Remain active and exercise as tolerated. Continue to monitor. Check lipids/TSH   Vitamin D deficiency At goal Continue supplement Monitor levels  BMI  25 Continue to recommend diet heavy in fruits and veggies and low in animal meats, cheeses, and dairy products, appropriate calorie intake Discuss exercise recommendations routinely Continue to monitor weight at each visit  Endometrial adenocarcinoma ([facility] S/p TAH, plan to monitor q661mwill see Dr. RoDenman Georgelternating; no adjunct therapy was recommended.   Anisocoria (unequal pupils) Stable, benign per ophthalmology  Orders Placed This Encounter  Procedures   CBC with Differential/Platelet   COMPLETE METABOLIC PANEL WITH GFR   Lipid panel   TSH   Hemoglobin A1c     Discussed med's effects and SE's. Screening labs and tests as requested with regular follow-up as recommended.  Over 20 minutes of exam, counseling, chart review, and complex, high level critical decision making was performed this visit.   Future Appointments  Date Time Provider [place]10/02/2022  1:30 PM Pyrtle, JaLajuan LinesMD [facility]LEC LBPCEndo  05/28/2022 10:00 AM CrDarrol JumpNP GA[facility]-GAAIM None     HPI  [age].o. very pleasant married Caucasian female, presents for a complete physical and follow up for has Essential hypertension; Hyperlipidemia associated with type 2 diabetes mellitus ([place] LADA (latent autoimmune diabetes in adults), managed as type 1 ([place] Hypothyroidism; Vitamin D deficiency; Medication management; Systolic murmur; CKD stage 2 due to type 2 diabetes mellitus ([place] Osteopenia; Endometrial adenocarcinoma ([place] Anisocoria; Constipation; and History of adenomatous polyp of colon on their problem list.    Followed by Dr. NeNori Riisor GYN and mammograms.  Osteopenia of spine per DEXA  at GYN office, had DEXA in 2022 per patient they are monitoring. She has a follow up next month with GYN.  She was found to have endometrial cancer in 2021 and on 12/13/2019 underwent robotic assisted hysterectomy with bilateral salpingooopherecomy with sentinel lymph node biopsy by Dr. EmEveritt AmberPathology  revealed a low risk  grade 1 stage Ia endometrial adenocarcinoma.  No adjuvant therapy was recommended. Plan to follow up q54mx 5 year, alternating with Dr. NNori Riisand Dr. TBerline Lopes   She has been following vascular for varicose veins and arterial disease.  She will see him next month for evaluation of LLE complications with increase hardening of the artery.   She is due to have an updated colonoscopy 12/13/21.   BMI is Body mass index is 26.06 kg/m., she has been working on diet and exercise. Works out for an hour most days.   Wt Readings from Last 3 Encounters:  11/26/21 151 lb 12.8 oz (68.9 kg)  11/23/21 153 lb (69.4 kg)  08/17/21 144 lb 12.8 oz (65.7 kg)   Her blood pressure has been controlled at home, today their BP is BP: 106/70   She does workout. She denies chest pain, shortness of breath, dizziness.   She is on cholesterol medication (atorvastatin 80 mg daily) and denies myalgias. Her cholesterol is not at goal of LDL <70. The cholesterol last visit was:   Lab Results  Component Value Date   CHOL 164 05/24/2021   HDL 72 05/24/2021   LDLCALC 77 05/24/2021   TRIG 73 05/24/2021   CHOLHDL 2.3 05/24/2021   [facility] for LADA as she was found to have elevated insulin antibodies.   Sugars run around 110 average.  She has FreeSTyle Libre.  Patient take a base of 12 units of glargine daily and covers bid with small doses (1-3 units) of humalog with each meal.    She has been working on diet and exercise for LADA diabetes. she is on bASA, she is on ACE/ARB and denies foot ulcerations, hyperglycemia, increased appetite, nausea, paresthesia of the feet, polydipsia, polyuria, visual disturbances, vomiting and weight loss.  Last A1C in the office was:  Lab Results  Component Value Date   HGBA1C 6.6 (H) 05/24/2021   She is on thyroid medication., postablation Her medication was not changed last visit.   Lab Results  Component Value Date   TSH  1.96 05/24/2021   She has CKD 1/2 monitored at this office:  Lab Results  Component Value Date   EGFR 89 05/24/2021   EGFR 91 12/20/2020   Lab Results  Component Value Date   MICRALBCREAT NOTE 05/24/2021   MICRALBCREAT 8 05/23/2020   MICRALBCREAT 4 05/21/2019   Patient is on Vitamin D supplement and at goal at recent check:    Lab Results  Component Value Date   VD25OH 79 05/24/2021     She has been on iron daily for hx of iron def anemia;  Lab Results  Component Value Date   IRON 143 05/24/2021   TIBC 296 05/24/2021   FERRITIN 47 05/24/2021     Current Medications:  Current Outpatient Medications on File Prior to Visit  Medication Sig Dispense Refill   aspirin EC 81 MG tablet Take 1 tablet (81 mg total) by mouth daily. Do not take for one week post-op. Swallow whole. 30 tablet 11   atorvastatin (LIPITOR) 80 MG tablet TAKE 1 TABLET BY MOUTH DAILY FOR CHOLESTEROL 90 tablet 33   Calcium Citrate-Vitamin D (CALCIUM CITRATE + D3 PO) Take 600 mg by mouth daily.      Cholecalciferol 125 MCG (5000 UT) TABS Take 5,000 Units by mouth daily.     Continuous Blood Gluc Sensor (FREESTYLE LIBRE 2 SENSOR) MISC Inject 1 sensor to the skin every 14 days for  continuous glucose monitoring.     estradiol (ESTRACE) 2 MG tablet Take 2 mg by mouth daily.     ferrous fumarate (HEMOCYTE - 106 MG FE) 325 (106 FE) MG TABS Take 1 tablet by mouth daily.     insulin glargine (LANTUS) 100 UNIT/ML Solostar Pen Inject 10-16 Units into the skin daily.      insulin lispro (HUMALOG KWIKPEN) 100 UNIT/ML KwikPen Inject 0-4 Units into the skin 3 (three) times daily before meals. Sliding scale: under 120 = 0 units  120 - 170 = 1 units  171 - 220 = 2 units  221 - 270 = 3 units  271 - 320 = 4 units  over 320 = 4 units     levothyroxine (SYNTHROID) 137 MCG tablet TAKE 1 TABLET BY MOUTH DAILY EXCEPT TAKE1/2 TABLET ON SUNDAY...TAKE 30-60 MINUTES PRIOR TO BREAKFAST ON EMPTY STOMACH WITH WATER ONLY 90 tablet 3    lisinopril (ZESTRIL) 2.5 MG tablet Take 1 tablet Daily for BP & Diabetic Kidney Protection 90 tablet 3   Magnesium 500 MG CAPS Take 500 mg by mouth daily.     Omega-3 Fatty Acids (FISH OIL) 1200 MG CAPS Take 1 capsule (1,200 mg total) by mouth 2 (two) times daily.     Polyethylene Glycol 3350 (MIRALAX PO) Take by mouth daily.     senna-docusate (SENOKOT-S) 8.6-50 MG tablet Take 2 tablets by mouth at bedtime. For AFTER surgery, do not take if having diarrhea (Patient not taking: Reported on 11/26/2021) 30 tablet 0   No current facility-administered medications on file prior to visit.   Allergies:  No Known Allergies Medical History:  She has Essential hypertension; Hyperlipidemia associated with type 2 diabetes mellitus ([place]); LADA (latent autoimmune diabetes in adults), managed as type 1 ([place]); Hypothyroidism; Vitamin D deficiency; Medication management; Systolic murmur; CKD stage 2 due to type 2 diabetes mellitus ([place]); Osteopenia; Endometrial adenocarcinoma ([place]); Anisocoria; Constipation; and History of adenomatous polyp of colon on their problem list. Health Maintenance:   Immunization History  Administered Date(s) Administered   Influenza Inj Mdck Quad With Preservative 12/11/2017   Influenza Split 12/06/2013, 12/08/2014   Influenza Whole 01/28/2012   Influenza,inj,Quad PF,6+ Mos 04/27/2020, 12/20/2020   Influenza-Unspecified 12/04/2015, 12/17/2016, 12/03/2018   Moderna Sars-Covid-2 Vaccination 05/09/2020   PFIZER(Purple Top)SARS-COV-2 Vaccination 05/27/2019, 06/17/2019   PPD Test 02/08/2014, 03/15/2015, 04/05/2016   Pneumococcal Polysaccharide-23 12/08/2014   Td 04/05/2016   Tdap 03/04/2006   Zoster Recombinat (Shingrix) 12/11/2020, 04/02/2021   Health Maintenance  Topic Date Due   COVID-19 Vaccine (4 - Mixed Product risk series) 07/04/2020   INFLUENZA VACCINE  10/02/2021   MAMMOGRAM  10/06/2021   COLONOSCOPY (Pts 45-59yr Insurance coverage will need to be confirmed)   11/24/2021   HEMOGLOBIN A1C  11/24/2021   OPHTHALMOLOGY EXAM  01/31/2022   Diabetic kidney evaluation - GFR measurement  05/25/2022   FOOT EXAM  05/25/2022   Diabetic kidney evaluation - Urine ACR  08/07/2022   PAP SMEAR-Modifier  10/06/2022   TETANUS/TDAP  04/05/2026   Hepatitis C Screening  Completed   HIV Screening  Completed   Zoster Vaccines- Shingrix  Completed   HPV VACCINES  Aged Out    Patient Care Team: MUnk Pinto MD as PCP - General  Surgical History:  She has a past surgical history that includes Vein ligation and stripping (Left, 2017); Vein ligation and stripping (Right, 2017); Endometrial ablation w/ novasure (2015); Robotic assisted total hysterectomy with bilateral salpingo oophorectomy (Bilateral, 12/13/2019); Sentinel node biopsy (N/A,  12/13/2019); Colonoscopy (12/13/2008); and Colonoscopy with propofol (11/24/2020). Family History:  Herfamily history includes Anxiety disorder in her daughter; Arrhythmia in her mother; Breast cancer (age of onset: 10) in her maternal grandmother; Colitis in her daughter; Colon polyps in her father and sister; Depression in her son; Diabetes in her mother; Fibromyalgia in her mother; Heart attack (age of onset: 53) in her father; Irritable bowel syndrome in her son; Osteoporosis in her mother and sister; Thyroid disease in her father and sister. Social History:  She reports that she quit smoking about 11 years ago. Her smoking use included cigarettes. She started smoking about 30 years ago. She has a 20.00 pack-year smoking history. She has never used smokeless tobacco. She reports current alcohol use of about 2.0 standard drinks of alcohol per week. She reports that she does not use drugs.  Review of Systems: Review of Systems  Constitutional:  Negative for malaise/fatigue and weight loss.  HENT:  Negative for hearing loss and tinnitus.   Eyes:  Negative for blurred vision and double vision.  Respiratory:  Negative for cough,  shortness of breath and wheezing.   Cardiovascular:  Negative for chest pain, palpitations, orthopnea, claudication and leg swelling.  Gastrointestinal:  Negative for abdominal pain, blood in stool, constipation, diarrhea, heartburn, melena, nausea and vomiting.  Genitourinary: Negative.   Musculoskeletal:  Negative for joint pain and myalgias.  Skin:  Negative for rash.  Neurological:  Negative for dizziness, tingling, sensory change, weakness and headaches.  Endo/Heme/Allergies:  Negative for polydipsia.  Psychiatric/Behavioral: Negative.    All other systems reviewed and are negative.   Physical Exam: Estimated body mass index is 26.06 kg/m as calculated from the following:   Height as of this encounter: '5\' 4"'  (1.626 m).   Weight as of this encounter: 151 lb 12.8 oz (68.9 kg). BP 106/70   Pulse 60   Temp (!) 97.2 F (36.2 C)   Ht '5\' 4"'  (1.626 m)   Wt 151 lb 12.8 oz (68.9 kg)   LMP 08/11/2013   SpO2 98%   BMI 26.06 kg/m  General Appearance: Well nourished, in no apparent distress.  Eyes: Anisocoria L pupil > R, stable, otherwise similarly reactive and accommodates, EOMs symmetrical, conjunctiva no swelling or erythema Sinuses: No Frontal/maxillary tenderness  ENT/Mouth: Ext aud canals clear, normal light reflex with TMs without erythema, bulging. Good dentition. No erythema, swelling, or exudate on post pharynx. Tonsils not swollen or erythematous. Hearing normal.  Neck: Supple, thyroid normal. No bruits  Respiratory: Respiratory effort normal, BS equal bilaterally without rales, rhonchi, wheezing or stridor.  Cardio: RRR without murmur, no rubs or gallops. Brisk peripheral pulses without edema.  Chest: symmetric, with normal excursions and percussion.  Breasts: Defer to GYN  Abdomen: Soft, nontender, no guarding, rebound, hernias, masses, or organomegaly. Well healed surgical scars.  Lymphatics: Non tender without lymphadenopathy.  Genitourinary: Defer to  GYN Musculoskeletal: Full ROM all peripheral extremities,5/5 strength, and normal gait.  Skin: Warm, dry without rashes, lesions, ecchymosis. She has multiple very small 1-2 mm uniform non-raised nevi to back and upper extremities. Neuro: Cranial nerves intact, reflexes equal bilaterally. Normal muscle tone, no cerebellar symptoms. Sensation intact bil to monofilament 10/10  Psych: Awake and oriented X 3, normal affect, Insight and Judgment appropriate.    Darrol Jump, DNP, AGNP-C 11:14 AM [facility]

## 2021-11-27 LAB — CBC WITH DIFFERENTIAL/PLATELET
Absolute Monocytes: 490 cells/uL (ref 200–950)
Basophils Absolute: 38 cells/uL (ref 0–200)
Basophils Relative: 0.8 %
Eosinophils Absolute: 82 cells/uL (ref 15–500)
Eosinophils Relative: 1.7 %
HCT: 43.3 % (ref 35.0–45.0)
Hemoglobin: 14.3 g/dL (ref 11.7–15.5)
Lymphs Abs: 1622 cells/uL (ref 850–3900)
MCH: 28.2 pg (ref 27.0–33.0)
MCHC: 33 g/dL (ref 32.0–36.0)
MCV: 85.4 fL (ref 80.0–100.0)
MPV: 11 fL (ref 7.5–12.5)
Monocytes Relative: 10.2 %
Neutro Abs: 2568 cells/uL (ref 1500–7800)
Neutrophils Relative %: 53.5 %
Platelets: 240 10*3/uL (ref 140–400)
RBC: 5.07 10*6/uL (ref 3.80–5.10)
RDW: 12.1 % (ref 11.0–15.0)
Total Lymphocyte: 33.8 %
WBC: 4.8 10*3/uL (ref 3.8–10.8)

## 2021-11-27 LAB — COMPLETE METABOLIC PANEL WITH GFR
AG Ratio: 2 (calc) (ref 1.0–2.5)
ALT: 32 U/L — ABNORMAL HIGH (ref 6–29)
AST: 23 U/L (ref 10–35)
Albumin: 4.1 g/dL (ref 3.6–5.1)
Alkaline phosphatase (APISO): 70 U/L (ref 37–153)
BUN: 23 mg/dL (ref 7–25)
CO2: 28 mmol/L (ref 20–32)
Calcium: 9.7 mg/dL (ref 8.6–10.4)
Chloride: 105 mmol/L (ref 98–110)
Creat: 0.79 mg/dL (ref 0.50–1.05)
Globulin: 2.1 g/dL (calc) (ref 1.9–3.7)
Glucose, Bld: 99 mg/dL (ref 65–99)
Potassium: 4.4 mmol/L (ref 3.5–5.3)
Sodium: 140 mmol/L (ref 135–146)
Total Bilirubin: 0.3 mg/dL (ref 0.2–1.2)
Total Protein: 6.2 g/dL (ref 6.1–8.1)
eGFR: 83 mL/min/{1.73_m2} (ref >=60)

## 2021-11-27 LAB — HEMOGLOBIN A1C
Hgb A1c MFr Bld: 6.6 % of total Hgb — ABNORMAL HIGH (ref <5.7)
Mean Plasma Glucose: 143 mg/dL
eAG (mmol/L): 7.9 mmol/L

## 2021-11-27 LAB — LIPID PANEL
Cholesterol: 156 mg/dL (ref <200)
HDL: 71 mg/dL (ref >=50)
LDL Cholesterol (Calc): 69 mg/dL (calc)
Non-HDL Cholesterol (Calc): 85 mg/dL (calc) (ref <130)
Total CHOL/HDL Ratio: 2.2 (calc) (ref <5.0)
Triglycerides: 75 mg/dL (ref <150)

## 2021-11-27 LAB — TSH: TSH: 2.43 mIU/L (ref 0.40–4.50)

## 2021-11-29 ENCOUNTER — Encounter: Payer: Self-pay | Admitting: Internal Medicine

## 2021-12-13 ENCOUNTER — Encounter: Payer: Self-pay | Admitting: Internal Medicine

## 2021-12-13 ENCOUNTER — Ambulatory Visit (AMBULATORY_SURGERY_CENTER): Payer: 59 | Admitting: Internal Medicine

## 2021-12-13 VITALS — BP 103/68 | HR 58 | Temp 96.0°F | Resp 13 | Ht 64.0 in | Wt 153.6 lb

## 2021-12-13 DIAGNOSIS — Z86010 Personal history of colonic polyps: Secondary | ICD-10-CM | POA: Diagnosis not present

## 2021-12-13 DIAGNOSIS — D124 Benign neoplasm of descending colon: Secondary | ICD-10-CM | POA: Diagnosis not present

## 2021-12-13 DIAGNOSIS — Z09 Encounter for follow-up examination after completed treatment for conditions other than malignant neoplasm: Secondary | ICD-10-CM | POA: Diagnosis present

## 2021-12-13 DIAGNOSIS — D123 Benign neoplasm of transverse colon: Secondary | ICD-10-CM

## 2021-12-13 MED ORDER — SODIUM CHLORIDE 0.9 % IV SOLN: 500.0000 mL | Freq: Once | INTRAVENOUS | Status: DC

## 2021-12-13 NOTE — Progress Notes (Signed)
Pt's states no medical or surgical changes since previsit or office visit. 

## 2021-12-13 NOTE — Progress Notes (Signed)
GASTROENTEROLOGY PROCEDURE H&P NOTE   Primary Care Physician: Unk Pinto, MD    Reason for Procedure:  History of colon polyps and suboptimal prep at colonoscopy in 2022  Plan:    Colonoscopy  Patient is appropriate for endoscopic procedure(s) in the ambulatory ([place]) setting.  The nature of the procedure, as well as the risks, benefits, and alternatives were carefully and thoroughly reviewed with the patient. Ample time for discussion and questions allowed. The patient understood, was satisfied, and agreed to proceed.     HPI: Mary Frank is a 66 y.o. female who presents for surveillance colonoscopy.  Medical history as below.  Tolerated the prep.  No recent chest pain or shortness of breath.  No abdominal pain today.  Past Medical History:  Diagnosis Date   Diabetes mellitus without complication ([place])    Endometrial adenocarcinoma ([place])    Hyperlipidemia    Hypertension    Hypothyroidism    Iron deficiency anemia 01/17/2011   Menopause    Osteopenia    Peripheral vascular disease ([place]) 1990   stripping    Thyroid disease    Type II or unspecified type diabetes mellitus without mention of complication, not stated as uncontrolled 02/04/2013   Varicose veins    Varicose veins of lower extremities with complications 17/49/4496   Vitamin D deficiency     Past Surgical History:  Procedure Laterality Date   COLONOSCOPY  12/13/2008   Dr.Brodie   COLONOSCOPY WITH PROPOFOL  11/24/2020   Jimma Ortman   ENDOMETRIAL ABLATION W/ NOVASURE  2015   Dr. [name]   ROBOTIC ASSISTED TOTAL HYSTERECTOMY WITH BILATERAL SALPINGO OOPHERECTOMY Bilateral 12/13/2019   Procedure: XI ROBOTIC ASSISTED TOTAL HYSTERECTOMY WITH BILATERAL SALPINGO OOPHORECTOMY (SPECIMEN WEIGHING >250 GRAMS), MINI LAPAROTOMY;  Surgeon: Everitt Amber, MD;  Location: [facility];  Service: Gynecology;  Laterality: Bilateral;   SENTINEL NODE BIOPSY N/A 12/13/2019   Procedure: SENTINEL LYMPH NODE BIOPSY;   Surgeon: Everitt Amber, MD;  Location: [facility];  Service: Gynecology;  Laterality: N/A;   VEIN LIGATION AND STRIPPING Left 2017   VEIN LIGATION AND STRIPPING Right 2017    Prior to Admission medications   Medication Sig Start Date End Date Taking? Authorizing Provider  aspirin EC 81 MG tablet Take 1 tablet (81 mg total) by mouth daily. Do not take for one week post-op. Swallow whole. 12/13/19  Yes Cross, Melissa D, NP  atorvastatin (LIPITOR) 80 MG tablet TAKE 1 TABLET BY MOUTH DAILY FOR CHOLESTEROL 04/04/21  Yes Alycia Rossetti, NP  Calcium Citrate-Vitamin D (CALCIUM CITRATE + D3 PO) Take 600 mg by mouth daily.    Yes [provider]  Cholecalciferol 125 MCG (5000 UT) TABS Take 5,000 Units by mouth daily.   Yes [provider]  Continuous Blood Gluc Sensor (FREESTYLE LIBRE 2 SENSOR) MISC Inject 1 sensor to the skin every 14 days for continuous glucose monitoring. 09/05/21  Yes [provider]  estradiol (ESTRACE) 2 MG tablet Take 2 mg by mouth daily. 10/30/20  Yes [provider]  ferrous fumarate (HEMOCYTE - 106 MG FE) 325 (106 FE) MG TABS Take 1 tablet by mouth daily.   Yes [provider]  insulin glargine (LANTUS) 100 UNIT/ML Solostar Pen Inject 10-16 Units into the skin daily.    Yes [provider]  insulin lispro (HUMALOG KWIKPEN) 100 UNIT/ML KwikPen Inject 0-4 Units into the skin 3 (three) times daily before meals. Sliding scale: under 120 = 0 units  120 -  170 = 1 units  171 - 220 = 2 units  221 - 270 = 3 units  271 - 320 = 4 units  over 320 = 4 units   Yes [provider]  levothyroxine (SYNTHROID) 137 MCG tablet TAKE 1 TABLET BY MOUTH DAILY EXCEPT TAKE1/2 TABLET ON SUNDAY...TAKE 30-60 MINUTES PRIOR TO BREAKFAST ON EMPTY STOMACH WITH WATER ONLY 06/12/21  Yes Alycia Rossetti, NP  lisinopril (ZESTRIL) 2.5 MG tablet Take 1 tablet Daily for BP & Diabetic Kidney Protection 12/21/20  Yes [name], NP   Magnesium 500 MG CAPS Take 500 mg by mouth daily.   Yes [provider]  Omega-3 Fatty Acids (FISH OIL) 1200 MG CAPS Take 1 capsule (1,200 mg total) by mouth 2 (two) times daily. 12/06/13  Yes Unk Pinto, MD  Polyethylene Glycol 3350 (MIRALAX PO) Take by mouth daily.   Yes [provider]  senna-docusate (SENOKOT-S) 8.6-50 MG tablet Take 2 tablets by mouth at bedtime. For AFTER surgery, do not take if having diarrhea Patient not taking: Reported on 11/26/2021 11/26/19   Joylene John D, NP    Current Outpatient Medications  Medication Sig Dispense Refill   aspirin EC 81 MG tablet Take 1 tablet (81 mg total) by mouth daily. Do not take for one week post-op. Swallow whole. 30 tablet 11   atorvastatin (LIPITOR) 80 MG tablet TAKE 1 TABLET BY MOUTH DAILY FOR CHOLESTEROL 90 tablet 33   Calcium Citrate-Vitamin D (CALCIUM CITRATE + D3 PO) Take 600 mg by mouth daily.      Cholecalciferol 125 MCG (5000 UT) TABS Take 5,000 Units by mouth daily.     Continuous Blood Gluc Sensor (FREESTYLE LIBRE 2 SENSOR) MISC Inject 1 sensor to the skin every 14 days for continuous glucose monitoring.     estradiol (ESTRACE) 2 MG tablet Take 2 mg by mouth daily.     ferrous fumarate (HEMOCYTE - 106 MG FE) 325 (106 FE) MG TABS Take 1 tablet by mouth daily.     insulin glargine (LANTUS) 100 UNIT/ML Solostar Pen Inject 10-16 Units into the skin daily.      insulin lispro (HUMALOG KWIKPEN) 100 UNIT/ML KwikPen Inject 0-4 Units into the skin 3 (three) times daily before meals. Sliding scale: under 120 = 0 units  120 - 170 = 1 units  171 - 220 = 2 units  221 - 270 = 3 units  271 - 320 = 4 units  over 320 = 4 units     levothyroxine (SYNTHROID) 137 MCG tablet TAKE 1 TABLET BY MOUTH DAILY EXCEPT TAKE1/2 TABLET ON SUNDAY...TAKE 30-60 MINUTES PRIOR TO BREAKFAST ON EMPTY STOMACH WITH WATER ONLY 90 tablet 3   lisinopril (ZESTRIL) 2.5 MG tablet Take 1 tablet Daily for BP & Diabetic Kidney Protection 90 tablet  3   Magnesium 500 MG CAPS Take 500 mg by mouth daily.     Omega-3 Fatty Acids (FISH OIL) 1200 MG CAPS Take 1 capsule (1,200 mg total) by mouth 2 (two) times daily.     Polyethylene Glycol 3350 (MIRALAX PO) Take by mouth daily.     senna-docusate (SENOKOT-S) 8.6-50 MG tablet Take 2 tablets by mouth at bedtime. For AFTER surgery, do not take if having diarrhea (Patient not taking: Reported on 11/26/2021) 30 tablet 0   Current Facility-Administered Medications  Medication Dose Route Frequency Provider Last Rate Last Admin   0.9 %  sodium chloride infusion  500 mL Intravenous Once Cian Costanzo, Lajuan Lines, MD  Allergies as of 12/13/2021   (No Known Allergies)    Family History  Problem Relation Age of Onset   Diabetes Mother    Fibromyalgia Mother    Arrhythmia Mother        Required chest compressions -   Osteoporosis Mother    Colon polyps Father    Thyroid disease Father        hypothyroid   Heart attack Father 30   Colon polyps Sister    Thyroid disease Sister    Osteoporosis Sister    Breast cancer Maternal Grandmother 33   Anxiety disorder Daughter    Colitis Daughter    Depression Son    Irritable bowel syndrome Son    Colon cancer Neg Hx    Esophageal cancer Neg Hx    Rectal cancer Neg Hx    Stomach cancer Neg Hx    Ovarian cancer Neg Hx    Endometrial cancer Neg Hx    Pancreatic cancer Neg Hx    Prostate cancer Neg Hx     Social History   Socioeconomic History   Marital status: Married    Spouse name: Not on file   Number of children: 2   Years of education: 63   Highest education level: High school graduate  Occupational History   Not on file  Tobacco Use   Smoking status: Former    Packs/day: 1.00    Years: 20.00    Total pack years: 20.00    Types: Cigarettes    Start date: 03/21/1991    Quit date: 03/20/2010    Years since quitting: 11.7   Smokeless tobacco: Never  Vaping Use   Vaping Use: Never used  Substance and Sexual Activity   Alcohol use: Yes     Alcohol/week: 2.0 standard drinks of alcohol    Types: 2 Glasses of wine per week    Comment: 2 glasses a wine once a week   Drug use: No   Sexual activity: Yes    Partners: Male    Birth control/protection: Surgical, Post-menopausal  Other Topics Concern   Not on file  Social History Narrative   Not on file   Social Determinants of Health   Financial Resource Strain: Not on file  Food Insecurity: Not on file  Transportation Needs: Not on file  Physical Activity: Sufficiently Active (05/09/2017)   Exercise Vital Sign    Days of Exercise per Week: 7 days    Minutes of Exercise per Session: 60 min  Stress: No Stress Concern Present (05/09/2017)   [place]    Feeling of Stress : Not at all  Social Connections: Not on file  Intimate Partner Violence: Not on file    Physical Exam: Vital signs in last 24 hours: '@BP'$  113/64   Pulse (!) 56   Temp (!) 96 F (35.6 C) (Temporal)   Ht '5\' 4"'$  (1.626 m)   Wt 153 lb 9.6 oz (69.7 kg)   LMP 08/11/2013   SpO2 98%   BMI 26.37 kg/m  GEN: NAD EYE: Sclerae anicteric ENT: MMM CV: Non-tachycardic Pulm: CTA b/l GI: Soft, NT/ND NEURO:  Alert & Oriented x 3   Zenovia Jarred, MD [facility]  12/13/2021 1:22 PM

## 2021-12-13 NOTE — Op Note (Signed)
[facility] Patient Name: Mary Frank Procedure Date: 12/13/2021 1:32 PM MRN: [id number] Endoscopist: Jerene Bears , MD Age: 66 Referring MD:  Date of Birth: Oct 12, 1955 Gender: Female Account #: [id number] Procedure:                Colonoscopy Indications:              High risk colon cancer surveillance: Personal                            history of non-advanced adenomas and SSP,                            colonoscopy 1 year ago with suboptimal prep Medicines:                Monitored Anesthesia Care Procedure:                Pre-Anesthesia Assessment:                           - Prior to the procedure, a History and Physical                            was performed, and patient medications and                            allergies were reviewed. The patient's tolerance of                            previous anesthesia was also reviewed. The risks                            and benefits of the procedure and the sedation                            options and risks were discussed with the patient.                            All questions were answered, and informed consent                            was obtained. Prior Anticoagulants: The patient has                            taken no previous anticoagulant or antiplatelet                            agents. ASA Grade Assessment: II - A patient with                            mild systemic disease. After reviewing the risks                            and benefits, the patient was deemed in  satisfactory condition to undergo the procedure.                           After obtaining informed consent, the colonoscope                            was passed under direct vision. Throughout the                            procedure, the patient's blood pressure, pulse, and                            oxygen saturations were monitored continuously. The                            Olympus PCF-H190DL (#[id number])  Colonoscope was                            introduced through the anus and advanced to the the                            cecum, identified by appendiceal orifice and                            ileocecal valve. The colonoscopy was performed                            without difficulty. The patient tolerated the                            procedure well. The quality of the bowel                            preparation was good (2 day prep). The ileocecal                            valve, appendiceal orifice, and rectum were                            photographed. Scope In: 1:39:54 PM Scope Out: 2:00:47 PM Scope Withdrawal Time: 0 hours 15 minutes 39 seconds  Total Procedure Duration: 0 hours 20 minutes 53 seconds  Findings:                 The digital rectal exam was normal.                           A 4 mm polyp was found in the transverse colon. The                            polyp was sessile. The polyp was removed with a                            cold snare. Resection and retrieval were complete.  Two sessile polyps were found in the descending                            colon. The polyps were 2 to 7 mm in size. These                            polyps were removed with a cold snare. Resection                            and retrieval were complete.                           Multiple small and large-mouthed diverticula were                            found in the sigmoid colon and descending colon.                           A diffuse area of mild melanosis was found in the                            entire colon.                           Internal hemorrhoids were found during                            retroflexion. The hemorrhoids were small. Complications:            No immediate complications. Estimated Blood Loss:     Estimated blood loss was minimal. Impression:               - One 4 mm polyp in the transverse colon, removed                             with a cold snare. Resected and retrieved.                           - Two 2 to 7 mm polyps in the descending colon,                            removed with a cold snare. Resected and retrieved.                           - Moderate diverticulosis in the sigmoid colon and                            in the descending colon.                           - Melanosis in the colon.                           - Small internal hemorrhoids. Recommendation:           -  Patient has a contact number available for                            emergencies. The signs and symptoms of potential                            delayed complications were discussed with the                            patient. Return to normal activities tomorrow.                            Written discharge instructions were provided to the                            patient.                           - Resume previous diet.                           - Continue present medications.                           - Await pathology results.                           - Repeat colonoscopy is recommended for                            surveillance. The colonoscopy date will be                            determined after pathology results from today's                            exam become available for review. Jerene Bears, MD 12/13/2021 2:05:00 PM This report has been signed electronically.

## 2021-12-13 NOTE — Patient Instructions (Signed)
Read all of the handouts given to you by your recovery room nurse.  YOU HAD AN ENDOSCOPIC PROCEDURE TODAY AT [facility]:   Refer to the procedure report that was given to you for any specific questions about what was found during the examination.  If the procedure report does not answer your questions, please call your gastroenterologist to clarify.  If you requested that your care partner not be given the details of your procedure findings, then the procedure report has been included in a sealed envelope for you to review at your convenience later.  YOU SHOULD EXPECT: Some feelings of bloating in the abdomen. Passage of more gas than usual.  Walking can help get rid of the air that was put into your GI tract during the procedure and reduce the bloating. If you had a lower endoscopy (such as a colonoscopy or flexible sigmoidoscopy) you may notice spotting of blood in your stool or on the toilet paper. If you underwent a bowel prep for your procedure, you may not have a normal bowel movement for a few days.  Please Note:  You might notice some irritation and congestion in your nose or some drainage.  This is from the oxygen used during your procedure.  There is no need for concern and it should clear up in a day or so.  SYMPTOMS TO REPORT IMMEDIATELY:  Following lower endoscopy (colonoscopy or flexible sigmoidoscopy):  Excessive amounts of blood in the stool  Significant tenderness or worsening of abdominal pains  Swelling of the abdomen that is new, acute  Fever of 100F or higher   For urgent or emergent issues, a gastroenterologist can be reached at any hour by calling [phone]. Do not use MyChart messaging for urgent concerns.    DIET:  We do recommend a small meal at first, but then you may proceed to your regular diet.  Drink plenty of fluids but you should avoid alcoholic beverages for 24 hours. Try to increase the fiber in your diet, and drink plenty of  water.  ACTIVITY:  You should plan to take it easy for the rest of today and you should NOT DRIVE or use heavy machinery until tomorrow (because of the sedation medicines used during the test).    FOLLOW UP: Our staff will call the number listed on your records the next business day following your procedure.  We will call around 7:15- 8:00 am to check on you and address any questions or concerns that you may have regarding the information given to you following your procedure. If we do not reach you, we will leave a message.     If any biopsies were taken you will be contacted by phone or by letter within the next 1-3 weeks.  Please call us at [phone] if you have not heard about the biopsies in 3 weeks.    SIGNATURES/CONFIDENTIALITY: You and/or your care partner have signed paperwork which will be entered into your electronic medical record.  These signatures attest to the fact that that the information above on your After Visit Summary has been reviewed and is understood.  Full responsibility of the confidentiality of this discharge information lies with you and/or your care-partner.

## 2021-12-13 NOTE — Progress Notes (Signed)
Pt resting comfortably. VSS. Airway intact. SBAR complete to RN. All questions answered.   

## 2021-12-14 ENCOUNTER — Telehealth: Payer: Self-pay

## 2021-12-14 NOTE — Telephone Encounter (Signed)
  Follow up Call-     12/13/2021   12:57 PM 11/24/2020   10:07 AM  Call back number  Post procedure Call Back phone  # [phone]  Permission to leave phone message Yes Yes     Patient questions:  Do you have a fever, pain , or abdominal swelling? No. Pain Score  0 *  Have you tolerated food without any problems? Yes.    Have you been able to return to your normal activities? Yes.    Do you have any questions about your discharge instructions: Diet   No. Medications  No. Follow up visit  No.  Do you have questions or concerns about your Care? No.  Actions: * If pain score is 4 or above: No action needed, pain <4.

## 2021-12-17 ENCOUNTER — Other Ambulatory Visit: Payer: Self-pay | Admitting: Internal Medicine

## 2021-12-17 MED ORDER — LISINOPRIL 2.5 MG PO TABS: ORAL_TABLET | ORAL | 3 refills | Status: DC

## 2021-12-19 ENCOUNTER — Encounter: Payer: Self-pay | Admitting: Internal Medicine

## 2022-04-17 ENCOUNTER — Other Ambulatory Visit: Payer: Self-pay | Admitting: Nurse Practitioner

## 2022-05-28 ENCOUNTER — Encounter: Payer: Self-pay | Admitting: Nurse Practitioner

## 2022-05-28 ENCOUNTER — Ambulatory Visit (INDEPENDENT_AMBULATORY_CARE_PROVIDER_SITE_OTHER): Payer: 59 | Admitting: Nurse Practitioner

## 2022-05-28 VITALS — BP 118/78 | HR 59 | Temp 97.7°F | Ht 64.0 in | Wt 152.4 lb

## 2022-05-28 DIAGNOSIS — I10 Essential (primary) hypertension: Secondary | ICD-10-CM

## 2022-05-28 DIAGNOSIS — M8588 Other specified disorders of bone density and structure, other site: Secondary | ICD-10-CM

## 2022-05-28 DIAGNOSIS — Z6825 Body mass index (BMI) 25.0-25.9, adult: Secondary | ICD-10-CM

## 2022-05-28 DIAGNOSIS — E139 Other specified diabetes mellitus without complications: Secondary | ICD-10-CM

## 2022-05-28 DIAGNOSIS — E1169 Type 2 diabetes mellitus with other specified complication: Secondary | ICD-10-CM

## 2022-05-28 DIAGNOSIS — E890 Postprocedural hypothyroidism: Secondary | ICD-10-CM

## 2022-05-28 DIAGNOSIS — Z79899 Other long term (current) drug therapy: Secondary | ICD-10-CM

## 2022-05-28 DIAGNOSIS — Z136 Encounter for screening for cardiovascular disorders: Secondary | ICD-10-CM | POA: Diagnosis not present

## 2022-05-28 DIAGNOSIS — I83899 Varicose veins of unspecified lower extremities with other complications: Secondary | ICD-10-CM

## 2022-05-28 DIAGNOSIS — Z0000 Encounter for general adult medical examination without abnormal findings: Secondary | ICD-10-CM

## 2022-05-28 DIAGNOSIS — Z0001 Encounter for general adult medical examination with abnormal findings: Secondary | ICD-10-CM

## 2022-05-28 DIAGNOSIS — Z86010 Personal history of colonic polyps: Secondary | ICD-10-CM

## 2022-05-28 DIAGNOSIS — C541 Malignant neoplasm of endometrium: Secondary | ICD-10-CM

## 2022-05-28 DIAGNOSIS — R011 Cardiac murmur, unspecified: Secondary | ICD-10-CM

## 2022-05-28 DIAGNOSIS — E559 Vitamin D deficiency, unspecified: Secondary | ICD-10-CM

## 2022-05-28 DIAGNOSIS — Z1389 Encounter for screening for other disorder: Secondary | ICD-10-CM

## 2022-05-28 DIAGNOSIS — H5702 Anisocoria: Secondary | ICD-10-CM

## 2022-05-28 NOTE — Patient Instructions (Signed)

## 2022-05-28 NOTE — Progress Notes (Signed)
Complete Physical  Assessment and Plan:  Diagnoses and all orders for this visit:  Encounter for Annual Physical Exam with abnormal findings Due annually  Health Maintenance reviewed Healthy lifestyle reviewed and goals set  Essential hypertension Discussed DASH (Dietary Approaches to Stop Hypertension) DASH diet is lower in sodium than a typical American diet. Cut back on foods that are high in saturated fat, cholesterol, and trans fats. Eat more whole-grain foods, fish, poultry, and nuts Remain active and exercise as tolerated daily.  Monitor BP at home-Call if greater than 130/80.  Check CMP/CBC'  Varicose veins of lower extremities with complications, unspecified laterality S/p vein ligation and stripping Advised support hose Vascular following  Postablative hypothyroidism Controlled. Continue Levothyroxine. Reminded to take on an empty stomach 65-19mins before food.  Stop any Biotin Supplement Mary-72 hours before next TSH level to reduce the risk of falsely low TSH levels. Continue to monitor.    LADA (latent autoimmune diabetes in adults), managed as type 1 ([place]) Followed by endocrinology Education: Reviewed 'ABCs' of diabetes management  Discussed goals to be met and/or maintained include A1C (<7) Blood pressure (<130/80) Cholesterol (LDL <70) Continue Eye Exam yearly  Continue Dental Exam Q6 mo Discussed dietary recommendations Discussed Physical Activity recommendations Foot exam UTD Check [id number]   Systolic murmur Mild, intermittent - benign characteristics Continue to monitor  Medication management All medications discussed and reviewed in full. All questions and concerns regarding medications addressed.    Mixed hyperlipidemia Discussed lifestyle modifications. Recommended diet heavy in fruits and veggies, omega 3's. Decrease consumption of animal meats, cheeses, and dairy products. Remain active and exercise as tolerated. Continue to monitor. Check  lipids/TSH   Vitamin D deficiency Continue to recommend supplementation for goal of 70-100 Monitor levels  BMI 25 Discussed appropriate BMI Diet modification. Physical activity. Encouraged/praised to build confidence.  Screening for hematuria or proteinuria -     Microalbumin / creatinine urine ratio -     Urinalysis w microscopic + reflex cultur  History of colon polyps;/ Colon cancer screening  Colonoscopy UTD - Recall 2025  Endometrial adenocarcinoma [facility]) S/p TAH, plan to monitor q65m; will see Dr. Kathrine Haddock. [name] alternating; no adjunct therapy was recommended.  Continue to monitor  Osteopenia of spine GYN follows - DEXA UTD - Tscore -2.1 Pursue a combination of weight-bearing exercises and strength training. Patients with severe mobility impairment Advised on fall prevention measures including proper lighting in all rooms, removal of area rugs and floor clutter, use of walking devices as deemed appropriate, avoidance of uneven walking surfaces. Smoking cessation and moderate alcohol consumption if applicable Consume [id number] to 1000 IU of vitamin D daily with a goal vitamin D serum value of 30 ng/mL or higher. Aim for 1000 to 1200 mg of elemental calcium daily through supplements and/or dietary sources.  Anisocoria  Stable, benign per ophth  Continue to monitor  Orders Placed This Encounter  Procedures   CBC with Differential/Platelet   COMPLETE METABOLIC PANEL WITH GFR   Magnesium   TSH   Hemoglobin A1c   Insulin, random   VITAMIN D 25 Hydroxy (Vit-D Deficiency, Fractures)   Urinalysis, Routine w reflex microscopic   Microalbumin / creatinine urine ratio   EKG 12-Lead   Notify office for further evaluation and treatment, questions or concerns if any reported s/s fail to improve.   The patient was advised to call back or seek an in-person evaluation if any symptoms worsen or if the condition fails to improve as anticipated.   Further  disposition pending results of  labs. Discussed med's effects and SE's.    I discussed the assessment and treatment plan with the patient. The patient was provided an opportunity to ask questions and all were answered. The patient agreed with the plan and demonstrated an understanding of the instructions.  Discussed med's effects and SE's. Screening labs and tests as requested with regular follow-up as recommended.  I provided 35 minutes of face-to-face time during this encounter including counseling, chart review, and critical decision making was preformed.  Today's Plan of Care is based on a patient-centered health care approach known as shared decision making - the decisions, tests and treatments allow for patient preferences and values to be balanced with clinical evidence.    Future Appointments  Date Time Provider [place]  05/28/2023 10:00 AM Darrol Jump, NP [facility] None   HPI  67 y.o. very pleasant married Caucasian Frank, presents for a complete physical and follow up for has Essential hypertension; Hyperlipidemia associated with type 2 diabetes mellitus ([place]); LADA (latent autoimmune diabetes in adults), managed as type 1 ([place]); Hypothyroidism; Vitamin D deficiency; Medication management; Systolic murmur; CKD stage 2 due to type 2 diabetes mellitus ([place]); Osteopenia; Endometrial adenocarcinoma ([place]); Anisocoria; Constipation; and History of adenomatous polyp of colon on their problem list.   Overall she reports feeling well today.  She has no new concerns.   She is married. Retired in 2021 from [employer], mother of 2, 4 grand kids in [place] and [place].   Followed by Dr. [name] for GYN and mammograms. Mammogram UTD. Osteopenia of spine per DEXA  at GYN office.  Continues to monitor.   She was found to have endometrial cancer in 2021 and on 12/13/2019 underwent robotic assisted hysterectomy with bilateral salpingooopherecomy with sentinel lymph node biopsy by Dr. Everitt Amber. Pathology revealed a low risk grade  1 stage Ia endometrial adenocarcinoma.  No adjuvant therapy was recommended. Plan to follow up q24m x 5 year, alternating with Dr. [name] and Dr. Berline Lopes.   BMI is Body mass index is 26.16 kg/m., she has been working on diet and exercise.  Wt Readings from Last 3 Encounters:  05/28/22 152 lb 6.4 oz (69.1 kg)  12/13/21 153 lb 9.6 oz (69.7 kg)  11/26/21 151 lb 12.8 oz (68.9 kg)   Her blood pressure has been controlled at home, today their BP is BP: 118/78  She does workout. She denies chest pain, shortness of breath, dizziness.   She is on cholesterol medication (atorvastatin 80 mg daily) and denies myalgias. Her cholesterol is not at goal of LDL <70. The cholesterol last visit was:   Lab Results  Component Value Date   CHOL 156 11/26/2021   HDL 71 11/26/2021   LDLCALC 69 11/26/2021   TRIG 75 11/26/2021   CHOLHDL 2.2 11/26/2021   [facility] for LADA as she was found to have elevated insulin antibodies.   Patient take a base of 12 units of glargine daily and covers bid with small doses (1-3 units) of humalog with each meal.  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  She has been working on diet and exercise for LADA diabetes. she is on bASA, she is on ACE/ARB and denies foot ulcerations, hyperglycemia, increased appetite, nausea, paresthesia of the feet, polydipsia, polyuria, visual disturbances, vomiting and weight loss.  Last A1C in the office was:  Lab Results  Component Value Date   HGBA1C 6.6 (H) 11/26/2021  She is on thyroid medication. Her medication was not changed last visit.   Lab Results  Component Value Date   TSH 2.43 11/26/2021   She has CKD 1/2 monitored at this office:  Lab Results  Component Value Date   EGFR 83 11/26/2021   EGFR 89 05/24/2021   EGFR 91 12/20/2020   Lab Results  Component Value Date   MICRALBCREAT NOTE 05/24/2021   MICRALBCREAT 8 05/23/2020   MICRALBCREAT 4 05/21/2019   Patient is  on Vitamin D supplement and at goal at recent check:    Lab Results  Component Value Date   VD25OH 79 05/24/2021     She has been on iron daily for hx of iron def anemia;  Lab Results  Component Value Date   IRON 143 05/24/2021   TIBC 296 05/24/2021   FERRITIN 47 05/24/2021     Current Medications:  Current Outpatient Medications on File Prior to Visit  Medication Sig Dispense Refill   aspirin EC 81 MG tablet Take 1 tablet (81 mg total) by mouth daily. Do not take for one week post-op. Swallow whole. 30 tablet 11   atorvastatin (LIPITOR) 80 MG tablet TAKE 1 TABLET BY MOUTH DAILY FOR CHOLESTEROL 90 tablet 33   Calcium Citrate-Vitamin D (CALCIUM CITRATE + D3 PO) Take 600 mg by mouth daily.      Cholecalciferol 125 MCG (5000 UT) TABS Take 5,000 Units by mouth daily.     Continuous Blood Gluc Sensor (FREESTYLE LIBRE 2 SENSOR) MISC Inject 1 sensor to the skin every 14 days for continuous glucose monitoring.     estradiol (ESTRACE) 2 MG tablet Take 2 mg by mouth daily.     ferrous fumarate (HEMOCYTE - 106 MG FE) 325 (106 FE) MG TABS Take 1 tablet by mouth daily.     insulin glargine (LANTUS) 100 UNIT/ML Solostar Pen Inject 10-16 Units into the skin daily.      insulin lispro (HUMALOG KWIKPEN) 100 UNIT/ML KwikPen Inject 0-4 Units into the skin 3 (three) times daily before meals. Sliding scale: under 120 = 0 units  120 - 170 = 1 units  171 - 220 = 2 units  221 - 270 = 3 units  271 - 320 = 4 units  over 320 = 4 units     levothyroxine (SYNTHROID) 137 MCG tablet TAKE 1 TABLET BY MOUTH DAILY EXCEPT TAKE1/2 TABLET ON SUNDAY...TAKE 30-60 MINUTES PRIOR TO BREAKFAST ON EMPTY STOMACH WITH WATER ONLY 90 tablet 3   lisinopril (ZESTRIL) 2.5 MG tablet Take 1 tablet Daily for BP & Diabetic Kidney Protection                                  /                                   TAKE                                    BY                                        MOUTH 90 tablet 3   Magnesium 500 MG CAPS Take  500  mg by mouth daily.     Omega-3 Fatty Acids (FISH OIL) 1200 MG CAPS Take 1 capsule (1,200 mg total) by mouth 2 (two) times daily.     Polyethylene Glycol 3350 (MIRALAX PO) Take by mouth daily.     senna-docusate (SENOKOT-S) 8.6-50 MG tablet Take 2 tablets by mouth at bedtime. For AFTER surgery, do not take if having diarrhea (Patient not taking: Reported on 11/26/2021) 30 tablet 0   No current facility-administered medications on file prior to visit.   Allergies:  No Known Allergies Medical History:  She has Essential hypertension; Hyperlipidemia associated with type 2 diabetes mellitus ([place]); LADA (latent autoimmune diabetes in adults), managed as type 1 ([place]); Hypothyroidism; Vitamin D deficiency; Medication management; Systolic murmur; CKD stage 2 due to type 2 diabetes mellitus ([place]); Osteopenia; Endometrial adenocarcinoma ([place]); Anisocoria; Constipation; and History of adenomatous polyp of colon on their problem list. Health Maintenance:   Immunization History  Administered Date(s) Administered   Influenza Inj Mdck Quad With Preservative 12/11/2017   Influenza Split 12/06/2013, 12/08/2014   Influenza Whole 01/28/2012   Influenza,inj,Quad PF,6+ Mos 04/27/2020, 12/20/2020   Influenza-Unspecified 12/04/2015, 12/17/2016, 12/03/2018   Moderna Sars-Covid-2 Vaccination 05/09/2020   PFIZER(Purple Top)SARS-COV-2 Vaccination 05/27/2019, 06/17/2019   PPD Test 02/08/2014, 03/15/2015, 04/05/2016   Pneumococcal Polysaccharide-23 12/08/2014   Td 04/05/2016   Tdap 03/04/2006   Zoster Recombinat (Shingrix) 12/11/2020, 04/02/2021   Health Maintenance  Topic Date Due   Lung Cancer Screening  Never done   INFLUENZA VACCINE  10/02/2021   MAMMOGRAM  10/06/2021   COVID-19 Vaccine (4 - 2023-24 season) 11/02/2021   OPHTHALMOLOGY EXAM  01/31/2022   Diabetic kidney evaluation - Urine ACR  05/25/2022   FOOT EXAM  05/25/2022   HEMOGLOBIN A1C  05/27/2022   PAP SMEAR-Modifier  10/06/2022   Diabetic  kidney evaluation - eGFR measurement  11/27/2022   DTaP/Tdap/Td (3 - Td or Tdap) 04/05/2026   COLONOSCOPY (Pts 45-46yrs Insurance coverage will need to be confirmed)  12/14/2026   Hepatitis C Screening  Completed   HIV Screening  Completed   Zoster Vaccines- Shingrix  Completed   HPV VACCINES  Aged Out   LMP: Postmenopausal Pap: getting pelvic every 6 months with GYN vs GYN surgery, now s/p TAH MGM: 06/2021 with GYN DEXA: 2022, osteopenia,-2.0; 2023 -2.1.    Colonoscopy: 11/2021 Dr. Hilarie Fredrickson, 5 year recall. Due 2028  Last Dental Exam: Dr. Radford Pax, 2023 - q 6 months Last Eye Exam: [facility] , 01/31/2022- no retinopathy  Patient Care Team: Unk Pinto, MD as PCP - General  Surgical History:  She has a past surgical history that includes Vein ligation and stripping (Left, 2017); Vein ligation and stripping (Right, 2017); Endometrial ablation w/ novasure (2015); Robotic assisted total hysterectomy with bilateral salpingo oophorectomy (Bilateral, 12/13/2019); Sentinel node biopsy (N/A, 12/13/2019); Colonoscopy (12/13/2008); and Colonoscopy with propofol (11/24/2020). Family History:  Herfamily history includes Anxiety disorder in her daughter; Arrhythmia in her mother; Breast cancer (age of onset: 56) in her maternal grandmother; Colitis in her daughter; Colon polyps in her father and sister; Depression in her son; Diabetes in her mother; Fibromyalgia in her mother; Heart attack (age of onset: 53) in her father; Irritable bowel syndrome in her son; Osteoporosis in her mother and sister; Thyroid disease in her father and sister. Social History:  She reports that she quit smoking about 12 years ago. Her smoking use included cigarettes. She started smoking about 31 years ago. She has a 20.00 pack-year smoking history. She  has never used smokeless tobacco. She reports current alcohol use of about 2.0 standard drinks of alcohol per week. She reports that she does not use drugs.  Review of  Systems: Review of Systems  Constitutional:  Negative for malaise/fatigue and weight loss.  HENT:  Negative for hearing loss and tinnitus.   Eyes:  Negative for blurred vision and double vision.  Respiratory:  Negative for cough, shortness of breath and wheezing.   Cardiovascular:  Negative for chest pain, palpitations, orthopnea, claudication and leg swelling.  Gastrointestinal:  Negative for abdominal pain, blood in stool, constipation, diarrhea, heartburn, melena, nausea and vomiting.  Genitourinary: Negative.   Musculoskeletal:  Negative for joint pain and myalgias.  Skin:  Negative for rash.  Neurological:  Negative for dizziness, tingling, sensory change, weakness and headaches.  Endo/Heme/Allergies:  Negative for polydipsia.  Psychiatric/Behavioral: Negative.    All other systems reviewed and are negative.   Physical Exam: Estimated body mass index is 26.16 kg/m as calculated from the following:   Height as of this encounter: 5\' 4"  (1.626 m).   Weight as of this encounter: 152 lb 6.4 oz (69.1 kg). BP 118/78   Pulse (!) 59   Temp 97.7 F (36.5 C)   Ht 5\' 4"  (1.626 m)   Wt 152 lb 6.4 oz (69.1 kg)   LMP 08/11/2013   SpO2 99%   BMI 26.16 kg/m  General Appearance: Well nourished, in no apparent distress.  Eyes: Anisocoria L pupil > R, stable, otherwise similarly reactive and accommodates, EOMs symmetrical, conjunctiva no swelling or erythema Sinuses: No Frontal/maxillary tenderness  ENT/Mouth: Ext aud canals clear, normal light reflex with TMs without erythema, bulging. Good dentition. No erythema, swelling, or exudate on post pharynx. Tonsils not swollen or erythematous. Hearing normal.  Neck: Supple, thyroid normal. No bruits  Respiratory: Respiratory effort normal, BS equal bilaterally without rales, rhonchi, wheezing or stridor.  Cardio: RRR without murmur, no rubs or gallops. Brisk peripheral pulses without edema.  Chest: symmetric, with normal excursions and percussion.   Breasts: Defer to GYN  Abdomen: Soft, nontender, no guarding, rebound, hernias, masses, or organomegaly. Well healed surgical scars.  Lymphatics: Non tender without lymphadenopathy.  Genitourinary: Defer to GYN Musculoskeletal: Full ROM all peripheral extremities,5/5 strength, and normal gait.  Skin: Warm, dry without rashes, lesions, ecchymosis. She has multiple very small 1-2 mm uniform non-raised nevi to back and upper extremities. Neuro: Cranial nerves intact, reflexes equal bilaterally. Normal muscle tone, no cerebellar symptoms. Sensation intact bil to monofilament 10/10  Psych: Awake and oriented X 3, normal affect, Insight and Judgment appropriate.   EKG: NSR, NSCPT   Mandeep Kiser, DNP, AGNP-C 10:24 AM [facility]

## 2022-05-29 LAB — COMPLETE METABOLIC PANEL WITH GFR
AG Ratio: 1.8 (calc) (ref 1.0–2.5)
ALT: 63 U/L — ABNORMAL HIGH (ref 6–29)
AST: 48 U/L — ABNORMAL HIGH (ref 10–35)
Albumin: 4.1 g/dL (ref 3.6–5.1)
Alkaline phosphatase (APISO): 73 U/L (ref 37–153)
BUN: 19 mg/dL (ref 7–25)
CO2: 30 mmol/L (ref 20–32)
Calcium: 9.5 mg/dL (ref 8.6–10.4)
Chloride: 107 mmol/L (ref 98–110)
Creat: 0.72 mg/dL (ref 0.50–1.05)
Globulin: 2.3 g/dL (calc) (ref 1.9–3.7)
Glucose, Bld: 81 mg/dL (ref 65–99)
Potassium: 4.5 mmol/L (ref 3.5–5.3)
Sodium: 143 mmol/L (ref 135–146)
Total Bilirubin: 0.5 mg/dL (ref 0.2–1.2)
Total Protein: 6.4 g/dL (ref 6.1–8.1)
eGFR: 93 mL/min/{1.73_m2} (ref >=60)

## 2022-05-29 LAB — HEMOGLOBIN A1C
Hgb A1c MFr Bld: 6.2 % of total Hgb — ABNORMAL HIGH (ref <5.7)
Mean Plasma Glucose: 131 mg/dL
eAG (mmol/L): 7.3 mmol/L

## 2022-05-29 LAB — CBC WITH DIFFERENTIAL/PLATELET
Absolute Monocytes: 442 cells/uL (ref 200–950)
Basophils Absolute: 91 cells/uL (ref 0–200)
Basophils Relative: 1.4 %
Eosinophils Absolute: 351 cells/uL (ref 15–500)
Eosinophils Relative: 5.4 %
HCT: 43.5 % (ref 35.0–45.0)
Hemoglobin: 14.4 g/dL (ref 11.7–15.5)
Lymphs Abs: 2106 cells/uL (ref 850–3900)
MCH: 28.3 pg (ref 27.0–33.0)
MCHC: 33.1 g/dL (ref 32.0–36.0)
MCV: 85.5 fL (ref 80.0–100.0)
MPV: 10.9 fL (ref 7.5–12.5)
Monocytes Relative: 6.8 %
Neutro Abs: 3510 cells/uL (ref 1500–7800)
Neutrophils Relative %: 54 %
Platelets: 257 10*3/uL (ref 140–400)
RBC: 5.09 10*6/uL (ref 3.80–5.10)
RDW: 11.8 % (ref 11.0–15.0)
Total Lymphocyte: 32.4 %
WBC: 6.5 10*3/uL (ref 3.8–10.8)

## 2022-05-29 LAB — MICROALBUMIN / CREATININE URINE RATIO
Creatinine, Urine: 67 mg/dL (ref 20–275)
Microalb, Ur: <0.2 mg/dL

## 2022-05-29 LAB — URINALYSIS, ROUTINE W REFLEX MICROSCOPIC
Bilirubin Urine: NEGATIVE
Glucose, UA: NEGATIVE
Hgb urine dipstick: NEGATIVE
Ketones, ur: NEGATIVE
Leukocytes,Ua: NEGATIVE
Nitrite: NEGATIVE
Protein, ur: NEGATIVE
Specific Gravity, Urine: 1.018 (ref 1.001–1.035)
pH: 6.5 (ref 5.0–8.0)

## 2022-05-29 LAB — INSULIN, RANDOM: Insulin: 11.4 u[IU]/mL

## 2022-05-29 LAB — TSH: TSH: 2.79 mIU/L (ref 0.40–4.50)

## 2022-05-29 LAB — VITAMIN D 25 HYDROXY (VIT D DEFICIENCY, FRACTURES): Vit D, 25-Hydroxy: 81 ng/mL (ref 30–100)

## 2022-05-29 LAB — MAGNESIUM: Magnesium: 2.3 mg/dL (ref 1.5–2.5)

## 2022-06-28 ENCOUNTER — Other Ambulatory Visit: Payer: Self-pay | Admitting: Nurse Practitioner

## 2022-06-28 DIAGNOSIS — E890 Postprocedural hypothyroidism: Secondary | ICD-10-CM

## 2022-08-03 LAB — PROTEIN / CREATININE RATIO, URINE: Creatinine, Urine: 56

## 2022-08-08 LAB — PROTEIN / CREATININE RATIO, URINE: Creatinine, Urine: 56

## 2022-11-08 ENCOUNTER — Encounter: Payer: Self-pay | Admitting: Obstetrics & Gynecology

## 2022-11-12 ENCOUNTER — Encounter: Payer: Self-pay | Admitting: Internal Medicine

## 2022-11-27 ENCOUNTER — Other Ambulatory Visit: Payer: Self-pay | Admitting: Internal Medicine

## 2022-12-02 ENCOUNTER — Encounter: Payer: Self-pay | Admitting: Nurse Practitioner

## 2022-12-02 ENCOUNTER — Ambulatory Visit (INDEPENDENT_AMBULATORY_CARE_PROVIDER_SITE_OTHER): Payer: Medicare Other | Admitting: Nurse Practitioner

## 2022-12-02 VITALS — BP 112/68 | HR 64 | Temp 97.5°F | Ht 64.0 in | Wt 154.0 lb

## 2022-12-02 DIAGNOSIS — E139 Other specified diabetes mellitus without complications: Secondary | ICD-10-CM

## 2022-12-02 DIAGNOSIS — R5383 Other fatigue: Secondary | ICD-10-CM

## 2022-12-02 DIAGNOSIS — E1169 Type 2 diabetes mellitus with other specified complication: Secondary | ICD-10-CM

## 2022-12-02 DIAGNOSIS — E890 Postprocedural hypothyroidism: Secondary | ICD-10-CM | POA: Diagnosis not present

## 2022-12-02 DIAGNOSIS — M8588 Other specified disorders of bone density and structure, other site: Secondary | ICD-10-CM

## 2022-12-02 DIAGNOSIS — Z79899 Other long term (current) drug therapy: Secondary | ICD-10-CM

## 2022-12-02 DIAGNOSIS — I83899 Varicose veins of unspecified lower extremities with other complications: Secondary | ICD-10-CM | POA: Diagnosis not present

## 2022-12-02 DIAGNOSIS — E559 Vitamin D deficiency, unspecified: Secondary | ICD-10-CM

## 2022-12-02 DIAGNOSIS — H5702 Anisocoria: Secondary | ICD-10-CM

## 2022-12-02 DIAGNOSIS — I10 Essential (primary) hypertension: Secondary | ICD-10-CM | POA: Diagnosis not present

## 2022-12-02 DIAGNOSIS — C541 Malignant neoplasm of endometrium: Secondary | ICD-10-CM

## 2022-12-02 DIAGNOSIS — R011 Cardiac murmur, unspecified: Secondary | ICD-10-CM

## 2022-12-02 DIAGNOSIS — E785 Hyperlipidemia, unspecified: Secondary | ICD-10-CM

## 2022-12-02 DIAGNOSIS — Z6825 Body mass index (BMI) 25.0-25.9, adult: Secondary | ICD-10-CM

## 2022-12-02 NOTE — Progress Notes (Signed)
Follow Up  Assessment and Plan:  Diagnoses and all orders for this visit:  Essential hypertension Discussed DASH (Dietary Approaches to Stop Hypertension) DASH diet is lower in sodium than a typical American diet. Cut back on foods that are high in saturated fat, cholesterol, and trans fats. Eat more whole-grain foods, fish, poultry, and nuts Remain active and exercise as tolerated daily.  Monitor BP at home-Call if greater than 130/80.  Check CMP/CBC'  Varicose veins of lower extremities with complications, unspecified laterality S/p vein ligation and stripping Advised support hose Vascular following  Postablative hypothyroidism Controlled. Continue Levothyroxine. Reminded to take on an empty stomach 30-37mins before food.  Stop any Biotin Supplement 48-72 hours before next TSH level to reduce the risk of falsely low TSH levels. Continue to monitor.    LADA (latent autoimmune diabetes in adults), managed as type 1 (HCC) Followed by endocrinology Education: Reviewed 'ABCs' of diabetes management  Discussed goals to be met and/or maintained include A1C (<7) Blood pressure (<130/80) Cholesterol (LDL <70) Continue Eye Exam yearly  Continue Dental Exam Q6 mo Discussed dietary recommendations Discussed Physical Activity recommendations Foot exam UTD Check A1C  Systolic murmur Mild, intermittent - benign characteristics Continue to monitor  Medication management All medications discussed and reviewed in full. All questions and concerns regarding medications addressed.    Mixed hyperlipidemia Discussed lifestyle modifications. Recommended diet heavy in fruits and veggies, omega 3's. Decrease consumption of animal meats, cheeses, and dairy products. Remain active and exercise as tolerated. Continue to monitor. Check lipids/TSH  Vitamin D deficiency Continue to recommend supplementation for goal of 70-100 Monitor levels  BMI 25 Discussed appropriate BMI Diet  modification. Physical activity. Encouraged/praised to build confidence.  Endometrial adenocarcinoma [facility]) S/p TAH, plan to monitor q60m; will see Dr. Caleb Popp. Jennette Kettle alternating; no adjunct therapy was recommended.  Continue to monitor  Osteopenia of spine GYN follows - DEXA UTD - Tscore -2.1 Pursue a combination of weight-bearing exercises and strength training. Patients with severe mobility impairment Advised on fall prevention measures including proper lighting in all rooms, removal of area rugs and floor clutter, use of walking devices as deemed appropriate, avoidance of uneven walking surfaces. Smoking cessation and moderate alcohol consumption if applicable Consume 800 to 1000 IU of vitamin D daily with a goal vitamin D serum value of 30 ng/mL or higher. Aim for 1000 to 1200 mg of elemental calcium daily through supplements and/or dietary sources.  Anisocoria  Stable, benign per ophth  Continue to monitor  Other fatigue Check B-12 levels Discussed benefit of supplementation.  Orders Placed This Encounter  Procedures   CBC with Differential/Platelet   COMPLETE METABOLIC PANEL WITH GFR   Lipid panel   TSH   Hemoglobin A1c   Vitamin B12   Notify office for further evaluation and treatment, questions or concerns if any reported s/s fail to improve.   The patient was advised to call back or seek an in-person evaluation if any symptoms worsen or if the condition fails to improve as anticipated.   Further disposition pending results of labs. Discussed med's effects and SE's.    I discussed the assessment and treatment plan with the patient. The patient was provided an opportunity to ask questions and all were answered. The patient agreed with the plan and demonstrated an understanding of the instructions.  Discussed med's effects and SE's. Screening labs and tests as requested with regular follow-up as recommended.  I provided  30 minutes of face-to-face time during this  encounter including  counseling, chart review, and critical decision making was preformed.  Today's Plan of Care is based on a patient-centered health care approach known as shared decision making - the decisions, tests and treatments allow for patient preferences and values to be balanced with clinical evidence.    Future Appointments  Date Time Provider Department Center  05/28/2023 10:00 AM Adela Glimpse, NP [facility] None   HPI  67 y.o. very pleasant married Caucasian female, presents for a  follow up for has Essential hypertension; Hyperlipidemia associated with type 2 diabetes mellitus (HCC); LADA (latent autoimmune diabetes in adults), managed as type 1 (HCC); Hypothyroidism; Vitamin D deficiency; Medication management; Systolic murmur; CKD stage 2 due to type 2 diabetes mellitus (HCC); Osteopenia; Endometrial adenocarcinoma (HCC); Anisocoria; Constipation; and History of adenomatous polyp of colon on their problem list.   Overall she reports feeling well today.  She has no new concerns.   She recently followed with Dermatology had pathology of left inferior medical posterior tibial punch was noted to be a leiomyoma. Performed by Dr. Juel Burrow 11/05/22.  Initially present as a 5 mm firm subcutaneous nodule.    She is concerned for fatigue in the afternoons.  She is able to get up in the mornings at 0500 with energy.  Walks her dogs for approximately 5 miles.  In the evenings around 5 pm she is reports feeling exhausted.    Followed by Dr. Jennette Kettle for GYN and mammograms. Mammogram UTD. Osteopenia of spine per DEXA  at GYN office.  Continues to monitor.   She was found to have endometrial cancer in 2021 and on 12/13/2019 underwent robotic assisted hysterectomy with bilateral salpingooopherecomy with sentinel lymph node biopsy by Dr. Adolphus Birchwood. Pathology revealed a low risk grade 1 stage Ia endometrial adenocarcinoma.  No adjuvant therapy was recommended. Plan to follow up q26m x 5 year, alternating  with Dr. Jennette Kettle and Dr. Pricilla Holm.   BMI is Body mass index is 26.43 kg/m., she has been working on diet and exercise.  Wt Readings from Last 3 Encounters:  12/02/22 154 lb (69.9 kg)  05/28/22 152 lb 6.4 oz (69.1 kg)  12/13/21 153 lb 9.6 oz (69.7 kg)   Her blood pressure has been controlled at home, today their BP is BP: 112/68  She does workout. She denies chest pain, shortness of breath, dizziness.   She is on cholesterol medication (atorvastatin 80 mg daily) and denies myalgias. Her cholesterol is not at goal of LDL <70. The cholesterol last visit was:   Lab Results  Component Value Date   CHOL 156 11/26/2021   HDL 71 11/26/2021   LDLCALC 69 11/26/2021   TRIG 75 11/26/2021   CHOLHDL 2.2 11/26/2021   [facility] for LADA as she was found to have elevated insulin antibodies.   Patient take a base of 12 units of glargine daily and covers bid with small doses (1-3 units) of humalog with each meal.  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  She has been working on diet and exercise for LADA diabetes. she is on bASA, she is on ACE/ARB and denies foot ulcerations, hyperglycemia, increased appetite, nausea, paresthesia of the feet, polydipsia, polyuria, visual disturbances, vomiting and weight loss.  She is requesting indications of the GLP-1 medicaitons for tmt of  LADA  Last A1C in the office was Lab Results  Component Value Date   HGBA1C 6.2 (H) 05/28/2022   She is on thyroid medication.  Her medication was not changed last visit.   Lab Results  Component Value Date   TSH 2.79 05/28/2022   She has CKD 1/2 monitored at this office:  Lab Results  Component Value Date   EGFR 93 05/28/2022   EGFR 83 11/26/2021   EGFR 89 05/24/2021   Lab Results  Component Value Date   MICRALBCREAT NOTE 05/28/2022   MICRALBCREAT NOTE 05/24/2021   MICRALBCREAT 8 05/23/2020   Patient is on Vitamin D supplement and at goal at recent check:     Lab Results  Component Value Date   VD25OH 81 05/28/2022     She has been on iron daily for hx of iron def anemia;  Lab Results  Component Value Date   IRON 143 05/24/2021   TIBC 296 05/24/2021   FERRITIN 47 05/24/2021     Current Medications:  Current Outpatient Medications on File Prior to Visit  Medication Sig Dispense Refill   aspirin EC 81 MG tablet Take 1 tablet (81 mg total) by mouth daily. Do not take for one week post-op. Swallow whole. 30 tablet 11   atorvastatin (LIPITOR) 80 MG tablet TAKE 1 TABLET BY MOUTH DAILY FOR CHOLESTEROL 90 tablet 33   Calcium Citrate-Vitamin D (CALCIUM CITRATE + D3 PO) Take 600 mg by mouth daily.      Cholecalciferol 125 MCG (5000 UT) TABS Take 5,000 Units by mouth daily.     Continuous Blood Gluc Sensor (FREESTYLE LIBRE 2 SENSOR) MISC Inject 1 sensor to the skin every 14 days for continuous glucose monitoring.     estradiol (ESTRACE) 2 MG tablet Take 2 mg by mouth daily.     ferrous fumarate (HEMOCYTE - 106 MG FE) 325 (106 FE) MG TABS Take 1 tablet by mouth daily.     insulin glargine (LANTUS) 100 UNIT/ML Solostar Pen Inject 10-16 Units into the skin daily.      insulin lispro (HUMALOG KWIKPEN) 100 UNIT/ML KwikPen Inject 0-4 Units into the skin 3 (three) times daily before meals. Sliding scale: under 120 = 0 units  120 - 170 = 1 units  171 - 220 = 2 units  221 - 270 = 3 units  271 - 320 = 4 units  over 320 = 4 units     levothyroxine (SYNTHROID) 137 MCG tablet TAKE 1 TABLET BY MOUTH DAILY EXCEPT TAKE 1/2 TABLET ON SUNDAY...TAKE 30-60 MINUTES PRIOR TO BREAKFAST ON EMPTY STOMACH WITH WATER ONLY (Patient taking differently: TAKE 1 TABLET DAILY EXCEPT ON TUESDAYS AND FRIDAYS, TAKE 1/2 TABLET. TAKE 30-60 MINUTES PRIOR TO BREAKFAST ON EMPTY STOMACH WITH WATER ONLY) 90 tablet 3   lisinopril (ZESTRIL) 2.5 MG tablet TAKE 1 TABLET BY MOUTH ONCE DAILY FOR BLOOD PRESSURE AND KIDNEY PROTECTION 90 tablet 0   Magnesium 500 MG CAPS Take 500 mg by mouth daily.      Omega-3 Fatty Acids (FISH OIL) 1200 MG CAPS Take 1 capsule (1,200 mg total) by mouth 2 (two) times daily.     Polyethylene Glycol 3350 (MIRALAX PO) Take by mouth daily.     No current facility-administered medications on file prior to visit.   Allergies:  No Known Allergies Medical History:  She has Essential hypertension; Hyperlipidemia associated with type 2 diabetes mellitus (HCC); LADA (latent autoimmune diabetes in adults), managed as type 1 (HCC); Hypothyroidism; Vitamin D deficiency; Medication management; Systolic murmur; CKD stage 2 due to type 2 diabetes mellitus (HCC); Osteopenia; Endometrial adenocarcinoma (HCC); Anisocoria; Constipation; and History of adenomatous polyp of colon on  their problem list. Health Maintenance:   Immunization History  Administered Date(s) Administered   Influenza Inj Mdck Quad With Preservative 12/11/2017   Influenza Split 12/06/2013, 12/08/2014   Influenza Whole 01/28/2012   Influenza,inj,Quad PF,6+ Mos 04/27/2020, 12/20/2020   Influenza-Unspecified 12/04/2015, 12/17/2016, 12/03/2018   Moderna Sars-Covid-2 Vaccination 05/09/2020   PFIZER(Purple Top)SARS-COV-2 Vaccination 05/27/2019, 06/17/2019   PPD Test 02/08/2014, 03/15/2015, 04/05/2016   Pneumococcal Polysaccharide-23 12/08/2014   Td 04/05/2016   Tdap 03/04/2006   Zoster Recombinant(Shingrix) 12/11/2020, 04/02/2021   Health Maintenance  Topic Date Due   Medicare Annual Wellness (AWV)  Never done   Lung Cancer Screening  Never done   MAMMOGRAM  10/06/2021   OPHTHALMOLOGY EXAM  01/31/2022   FOOT EXAM  05/25/2022   Pneumonia Vaccine 22+ Years old (2 of 2 - PCV) 09/25/2022   INFLUENZA VACCINE  10/03/2022   COVID-19 Vaccine (4 - 2023-24 season) 11/03/2022   HEMOGLOBIN A1C  11/28/2022   Diabetic kidney evaluation - eGFR measurement  05/28/2023   Diabetic kidney evaluation - Urine ACR  05/28/2023   DTaP/Tdap/Td (3 - Td or Tdap) 04/05/2026   Colonoscopy  12/14/2026   DEXA SCAN   Completed   Hepatitis C Screening  Completed   HIV Screening  Completed   Zoster Vaccines- Shingrix  Completed   HPV VACCINES  Aged Out   Patient Care Team: Lucky Cowboy, MD as PCP - General  Surgical History:  She has a past surgical history that includes Vein ligation and stripping (Left, 2017); Vein ligation and stripping (Right, 2017); Endometrial ablation w/ novasure (2015); Robotic assisted total hysterectomy with bilateral salpingo oophorectomy (Bilateral, 12/13/2019); Sentinel node biopsy (N/A, 12/13/2019); Colonoscopy (12/13/2008); and Colonoscopy with propofol (11/24/2020). Family History:  Herfamily history includes Anxiety disorder in her daughter; Arrhythmia in her mother; Breast cancer (age of onset: 83) in her maternal grandmother; Colitis in her daughter; Colon polyps in her father and sister; Depression in her son; Diabetes in her mother; Fibromyalgia in her mother; Heart attack (age of onset: 52) in her father; Irritable bowel syndrome in her son; Osteoporosis in her mother and sister; Thyroid disease in her father and sister. Social History:  She reports that she quit smoking about 12 years ago. Her smoking use included cigarettes. She started smoking about 31 years ago. She has a 20 pack-year smoking history. She has never used smokeless tobacco. She reports current alcohol use of about 2.0 standard drinks of alcohol per week. She reports that she does not use drugs.  Review of Systems: Review of Systems  Constitutional:  Negative for malaise/fatigue and weight loss.  HENT:  Negative for hearing loss and tinnitus.   Eyes:  Negative for blurred vision and double vision.  Respiratory:  Negative for cough, shortness of breath and wheezing.   Cardiovascular:  Negative for chest pain, palpitations, orthopnea, claudication and leg swelling.  Gastrointestinal:  Negative for abdominal pain, blood in stool, constipation, diarrhea, heartburn, melena, nausea and vomiting.   Genitourinary: Negative.   Musculoskeletal:  Negative for joint pain and myalgias.  Skin:  Negative for rash.  Neurological:  Negative for dizziness, tingling, sensory change, weakness and headaches.  Endo/Heme/Allergies:  Negative for polydipsia.  Psychiatric/Behavioral: Negative.    All other systems reviewed and are negative.   Physical Exam: Estimated body mass index is 26.43 kg/m as calculated from the following:   Height as of this encounter: 5\' 4"  (1.626 m).   Weight as of this encounter: 154 lb (69.9 kg). BP 112/68   Pulse  64   Temp (!) 97.5 F (36.4 C)   Ht 5\' 4"  (1.626 m)   Wt 154 lb (69.9 kg)   LMP 08/11/2013   SpO2 98%   BMI 26.43 kg/m  General Appearance: Well nourished, in no apparent distress.  Eyes: Anisocoria L pupil > R, stable, otherwise similarly reactive and accommodates, EOMs symmetrical, conjunctiva no swelling or erythema Sinuses: No Frontal/maxillary tenderness  ENT/Mouth: Ext aud canals clear, normal light reflex with TMs without erythema, bulging. Good dentition. No erythema, swelling, or exudate on post pharynx. Tonsils not swollen or erythematous. Hearing normal.  Neck: Supple, thyroid normal. No bruits  Respiratory: Respiratory effort normal, BS equal bilaterally without rales, rhonchi, wheezing or stridor.  Cardio: RRR without murmur, no rubs or gallops. Brisk peripheral pulses without edema.  Chest: symmetric, with normal excursions and percussion.  Breasts: Defer to GYN  Abdomen: Soft, nontender, no guarding, rebound, hernias, masses, or organomegaly. Well healed surgical scars.  Lymphatics: Non tender without lymphadenopathy.  Genitourinary: Defer to GYN Musculoskeletal: Full ROM all peripheral extremities,5/5 strength, and normal gait.  Skin: Warm, dry without rashes, lesions, ecchymosis. She has multiple very small 1-2 mm uniform non-raised nevi to back and upper extremities. Neuro: Cranial nerves intact, reflexes equal bilaterally. Normal  muscle tone, no cerebellar symptoms. Sensation intact bil to monofilament 10/10  Psych: Awake and oriented X 3, normal affect, Insight and Judgment appropriate.   Adela Glimpse, DNP, AGNP-C 12:16 PM [facility]

## 2022-12-02 NOTE — Patient Instructions (Signed)

## 2022-12-03 LAB — CBC WITH DIFFERENTIAL/PLATELET
Absolute Monocytes: 458 {cells}/uL (ref 200–950)
Basophils Absolute: 49 {cells}/uL (ref 0–200)
Basophils Relative: 0.8 %
Eosinophils Absolute: 128 {cells}/uL (ref 15–500)
Eosinophils Relative: 2.1 %
HCT: 45.7 % — ABNORMAL HIGH (ref 35.0–45.0)
Hemoglobin: 14.7 g/dL (ref 11.7–15.5)
Lymphs Abs: 1879 {cells}/uL (ref 850–3900)
MCH: 27.9 pg (ref 27.0–33.0)
MCHC: 32.2 g/dL (ref 32.0–36.0)
MCV: 86.9 fL (ref 80.0–100.0)
MPV: 11.1 fL (ref 7.5–12.5)
Monocytes Relative: 7.5 %
Neutro Abs: 3587 {cells}/uL (ref 1500–7800)
Neutrophils Relative %: 58.8 %
Platelets: 259 10*3/uL (ref 140–400)
RBC: 5.26 10*6/uL — ABNORMAL HIGH (ref 3.80–5.10)
RDW: 11.8 % (ref 11.0–15.0)
Total Lymphocyte: 30.8 %
WBC: 6.1 10*3/uL (ref 3.8–10.8)

## 2022-12-03 LAB — LIPID PANEL
Cholesterol: 179 mg/dL (ref <200)
HDL: 82 mg/dL (ref >=50)
LDL Cholesterol (Calc): 81 mg/dL
Non-HDL Cholesterol (Calc): 97 mg/dL (ref <130)
Total CHOL/HDL Ratio: 2.2 (calc) (ref <5.0)
Triglycerides: 75 mg/dL (ref <150)

## 2022-12-03 LAB — COMPLETE METABOLIC PANEL WITH GFR
AG Ratio: 1.8 (calc) (ref 1.0–2.5)
ALT: 32 U/L — ABNORMAL HIGH (ref 6–29)
AST: 23 U/L (ref 10–35)
Albumin: 4.2 g/dL (ref 3.6–5.1)
Alkaline phosphatase (APISO): 73 U/L (ref 37–153)
BUN: 24 mg/dL (ref 7–25)
CO2: 28 mmol/L (ref 20–32)
Calcium: 9.9 mg/dL (ref 8.6–10.4)
Chloride: 105 mmol/L (ref 98–110)
Creat: 0.65 mg/dL (ref 0.50–1.05)
Globulin: 2.4 g/dL (ref 1.9–3.7)
Glucose, Bld: 94 mg/dL (ref 65–99)
Potassium: 4.7 mmol/L (ref 3.5–5.3)
Sodium: 140 mmol/L (ref 135–146)
Total Bilirubin: 0.4 mg/dL (ref 0.2–1.2)
Total Protein: 6.6 g/dL (ref 6.1–8.1)
eGFR: 98 mL/min/{1.73_m2} (ref >=60)

## 2022-12-03 LAB — TSH: TSH: 1.15 m[IU]/L (ref 0.40–4.50)

## 2022-12-03 LAB — HEMOGLOBIN A1C
Hgb A1c MFr Bld: 6.4 %{Hb} — ABNORMAL HIGH (ref <5.7)
Mean Plasma Glucose: 137 mg/dL
eAG (mmol/L): 7.6 mmol/L

## 2022-12-03 LAB — VITAMIN B12: Vitamin B-12: 1817 pg/mL — ABNORMAL HIGH (ref 200–1100)

## 2023-02-04 LAB — HEMOGLOBIN A1C: Hemoglobin A1C: 6.1

## 2023-02-04 LAB — COMPREHENSIVE METABOLIC PANEL: eGFR: >90

## 2023-02-04 LAB — HM MAMMOGRAPHY

## 2023-03-04 ENCOUNTER — Other Ambulatory Visit: Payer: Self-pay | Admitting: Nurse Practitioner

## 2023-03-10 LAB — HM DIABETES EYE EXAM

## 2023-05-22 ENCOUNTER — Ambulatory Visit: Payer: 59 | Admitting: General Practice

## 2023-05-22 ENCOUNTER — Encounter: Payer: Self-pay | Admitting: General Practice

## 2023-05-22 VITALS — BP 110/70 | HR 73 | Temp 97.5°F | Ht 63.75 in | Wt 150.0 lb

## 2023-05-22 DIAGNOSIS — E1122 Type 2 diabetes mellitus with diabetic chronic kidney disease: Secondary | ICD-10-CM

## 2023-05-22 DIAGNOSIS — Z7689 Persons encountering health services in other specified circumstances: Secondary | ICD-10-CM | POA: Insufficient documentation

## 2023-05-22 DIAGNOSIS — E1169 Type 2 diabetes mellitus with other specified complication: Secondary | ICD-10-CM | POA: Diagnosis not present

## 2023-05-22 DIAGNOSIS — E139 Other specified diabetes mellitus without complications: Secondary | ICD-10-CM

## 2023-05-22 DIAGNOSIS — C541 Malignant neoplasm of endometrium: Secondary | ICD-10-CM

## 2023-05-22 DIAGNOSIS — N182 Chronic kidney disease, stage 2 (mild): Secondary | ICD-10-CM

## 2023-05-22 DIAGNOSIS — E890 Postprocedural hypothyroidism: Secondary | ICD-10-CM | POA: Diagnosis not present

## 2023-05-22 DIAGNOSIS — Z794 Long term (current) use of insulin: Secondary | ICD-10-CM

## 2023-05-22 DIAGNOSIS — Z23 Encounter for immunization: Secondary | ICD-10-CM

## 2023-05-22 DIAGNOSIS — E785 Hyperlipidemia, unspecified: Secondary | ICD-10-CM

## 2023-05-22 DIAGNOSIS — I10 Essential (primary) hypertension: Secondary | ICD-10-CM | POA: Diagnosis not present

## 2023-05-22 DIAGNOSIS — Z87891 Personal history of nicotine dependence: Secondary | ICD-10-CM

## 2023-05-22 NOTE — Assessment & Plan Note (Signed)
 S/p TAH.  plan to monitor every 6 months.   will see Dr. Caleb Popp. Jennette Kettle alternating.  no adjunct therapy was recommended.  Continue to monitor

## 2023-05-22 NOTE — Assessment & Plan Note (Addendum)
 Controlled. Last hemoglobin A1c was 6.1 in December.  Followed by endocrinology.   Foot exam completed today.   Continue Lantus 8 units once daily and Humalog sliding scale.  Follow up in 6 months.

## 2023-05-22 NOTE — Assessment & Plan Note (Signed)
 Controlled.  Followed by endocrinology.   Reviewed TSH from 02/04/23.  Continue Levothyroxine as prescribed.

## 2023-05-22 NOTE — Progress Notes (Signed)
 New Patient Office Visit  Subjective    Patient ID: Mary Frank, female    DOB: [birth year]  Age: 68 y.o. MRN: [id number]  CC:  Chief Complaint  Patient presents with   Transitions Of Care    HPI Mary Frank is a 68 y.o. female presents to establish care and follow up of chronic conditions.  Last physical/labs/PCP: Dr. Posey Boyer.   Type 1 DM/Lada: diagnosed in 2014. Followed by endocrinology. Managed on Lantus 8 units once daily and Humalog sliding scale. Last A1c was on 02/04/23- 6.1. follows with them every 6 months. She uses a continuous glucose monitoring device to monitor her sugars. Declines any recent hypoglycemic events.   Hypothyroidism: currently managed on Levothyroxine 137 mcg five days a week and 68.5 mcg twice a week. She takes it correctly. Followed by Endocrinology.   Immunizations: -Tetanus: Completed in 2018 -Influenza: due today -Shingles: Completed Shingrix series -Pneumonia: due  Diet: Fair diet.  Exercise: regular exercise. Goes to the gym 3 times a week. Does walk, free weights and cardio at home.   Eye exam: Completes annually  Dental exam: Completes semi-annually    Colonoscopy: Completed in 2023 Mammogram: completed in 2025- need records. Lung Cancer Screening: due   Outpatient Encounter Medications as of 05/22/2023  Medication Sig   aspirin EC 81 MG tablet Take 1 tablet (81 mg total) by mouth daily. Do not take for one week post-op. Swallow whole.   atorvastatin (LIPITOR) 80 MG tablet TAKE 1 TABLET BY MOUTH DAILY FOR CHOLESTEROL   Calcium Citrate-Vitamin D (CALCIUM CITRATE + D3 PO) Take 600 mg by mouth daily.    Cholecalciferol 125 MCG (5000 UT) TABS Take 5,000 Units by mouth daily.   Continuous Blood Gluc Sensor (FREESTYLE LIBRE 2 SENSOR) MISC Inject 1 sensor to the skin every 14 days for continuous glucose monitoring.   estradiol (ESTRACE) 2 MG tablet Take 2 mg by mouth as directed. Takes on Mondays, Wednesdays and fridays   ferrous fumarate  (HEMOCYTE - 106 MG FE) 325 (106 FE) MG TABS Take 1 tablet by mouth daily.   insulin glargine (LANTUS) 100 UNIT/ML Solostar Pen Inject 10-16 Units into the skin daily.    insulin lispro (HUMALOG KWIKPEN) 100 UNIT/ML KwikPen Inject 0-4 Units into the skin 3 (three) times daily before meals. Sliding scale: under 120 = 0 units  120 - 170 = 1 units  171 - 220 = 2 units  221 - 270 = 3 units  271 - 320 = 4 units  over 320 = 4 units   levothyroxine (SYNTHROID) 137 MCG tablet TAKE 1 TABLET BY MOUTH DAILY EXCEPT TAKE 1/2 TABLET ON SUNDAY...TAKE 30-60 MINUTES PRIOR TO BREAKFAST ON EMPTY STOMACH WITH WATER ONLY (Patient taking differently: TAKE 1 TABLET DAILY EXCEPT ON TUESDAYS AND FRIDAYS, TAKE 1/2 TABLET. TAKE 30-60 MINUTES PRIOR TO BREAKFAST ON EMPTY STOMACH WITH WATER ONLY)   lisinopril (ZESTRIL) 2.5 MG tablet TAKE 1 TABLET BY MOUTH ONCE DAILY FOR BLOOD PRESSURE AND KIDNEY PROTECTION   Magnesium 500 MG CAPS Take 500 mg by mouth daily.   Omega-3 Fatty Acids (FISH OIL) 1200 MG CAPS Take 1 capsule (1,200 mg total) by mouth 2 (two) times daily.   Polyethylene Glycol 3350 (MIRALAX PO) Take by mouth daily.   No facility-administered encounter medications on file as of 05/22/2023.    Past Medical History:  Diagnosis Date   Diabetes mellitus without complication (HCC)    Endometrial adenocarcinoma (HCC)    Hyperlipidemia  Hypertension    Hypothyroidism    Iron deficiency anemia 01/17/2011   Menopause    Osteopenia    Peripheral vascular disease (HCC) 1990   stripping    Thyroid disease    Type II or unspecified type diabetes mellitus without mention of complication, not stated as uncontrolled 02/04/2013   Varicose veins    Varicose veins of lower extremities with complications 01/11/2014   Vitamin D deficiency     Past Surgical History:  Procedure Laterality Date   COLONOSCOPY  12/13/2008   Dr.Brodie   COLONOSCOPY WITH PROPOFOL  11/24/2020   Pyrtle   ENDOMETRIAL ABLATION W/ NOVASURE  2015    Dr. Jennette Kettle   ROBOTIC ASSISTED TOTAL HYSTERECTOMY WITH BILATERAL SALPINGO OOPHERECTOMY Bilateral 12/13/2019   Procedure: XI ROBOTIC ASSISTED TOTAL HYSTERECTOMY WITH BILATERAL SALPINGO OOPHORECTOMY (SPECIMEN WEIGHING >250 GRAMS), MINI LAPAROTOMY;  Surgeon: Adolphus Birchwood, MD;  Location: [facility];  Service: Gynecology;  Laterality: Bilateral;   SENTINEL NODE BIOPSY N/A 12/13/2019   Procedure: SENTINEL LYMPH NODE BIOPSY;  Surgeon: Adolphus Birchwood, MD;  Location: [facility];  Service: Gynecology;  Laterality: N/A;   VEIN LIGATION AND STRIPPING Left 2017   VEIN LIGATION AND STRIPPING Right 2017    Family History  Problem Relation Age of Onset   Diabetes Mother    Fibromyalgia Mother    Arrhythmia Mother        Required chest compressions -   Osteoporosis Mother    Arthritis Mother    Hearing loss Mother    Hypertension Mother    Kidney disease Mother    Colon polyps Father    Thyroid disease Father        hypothyroid   Heart attack Father 58   Colon polyps Sister    Thyroid disease Sister    Osteoporosis Sister    Breast cancer Maternal Grandmother 44   Cancer Maternal Grandmother    Anxiety disorder Daughter    Colitis Daughter    Depression Son    Irritable bowel syndrome Son    Colon cancer Neg Hx    Esophageal cancer Neg Hx    Rectal cancer Neg Hx    Stomach cancer Neg Hx    Ovarian cancer Neg Hx    Endometrial cancer Neg Hx    Pancreatic cancer Neg Hx    Prostate cancer Neg Hx     Social History   Socioeconomic History   Marital status: Married    Spouse name: Kesia Dalto   Number of children: 2   Years of education: 14   Highest education level: 12th grade  Occupational History   Not on file  Tobacco Use   Smoking status: Former    Current packs/day: 0.00    Average packs/day: 1 pack/day for 20.0 years (20.0 ttl pk-yrs)    Types: Cigarettes    Start date: 03/21/1991    Quit date: 03/20/2010    Years since quitting: 13.1    Smokeless tobacco: Never  Vaping Use   Vaping status: Never Used  Substance and Sexual Activity   Alcohol use: Yes    Alcohol/week: 2.0 standard drinks of alcohol    Types: 2 Glasses of wine per week    Comment: 2 glasses a wine once a week   Drug use: No   Sexual activity: Yes    Partners: Male    Birth control/protection: Post-menopausal, Surgical  Other Topics Concern   Not on file  Social History Narrative   Not  on file   Social Drivers of Health   Financial Resource Strain: Low Risk  (05/19/2023)   Overall Financial Resource Strain (CARDIA)    Difficulty of Paying Living Expenses: Not hard at all  Food Insecurity: No Food Insecurity (05/19/2023)   Hunger Vital Sign    Worried About Running Out of Food in the Last Year: Never true    Ran Out of Food in the Last Year: Never true  Transportation Needs: No Transportation Needs (05/19/2023)   PRAPARE - Administrator, Civil Service (Medical): No    Lack of Transportation (Non-Medical): No  Physical Activity: Sufficiently Active (05/19/2023)   Exercise Vital Sign    Days of Exercise per Week: 6 days    Minutes of Exercise per Session: 60 min  Stress: No Stress Concern Present (05/19/2023)   [employer] of Occupational Health - Occupational Stress Questionnaire    Feeling of Stress : Not at all  Social Connections: Moderately Integrated (05/19/2023)   Social Connection and Isolation Panel [NHANES]    Frequency of Communication with Friends and Family: More than three times a week    Frequency of Social Gatherings with Friends and Family: More than three times a week    Attends Religious Services: More than 4 times per year    Active Member of [employer] or Organizations: No    Attends Engineer, structural: Not on file    Marital Status: Married  Catering manager Violence: Not on file    Review of Systems  Constitutional:  Negative for chills and fever.  Respiratory:  Negative for shortness of breath.    Cardiovascular:  Negative for chest pain.  Gastrointestinal:  Negative for abdominal pain, constipation, diarrhea, heartburn, nausea and vomiting.  Genitourinary:  Negative for dysuria, frequency and urgency.  Neurological:  Negative for dizziness and headaches.  Endo/Heme/Allergies:  Negative for polydipsia.  Psychiatric/Behavioral:  Negative for depression and suicidal ideas. The patient is not nervous/anxious.         Objective    BP 110/70 (BP Location: Left Arm, Patient Position: Sitting, Cuff Size: Normal)   Pulse 73   Temp (!) 97.5 F (36.4 C) (Oral)   Ht 5' 3.75" (1.619 m)   Wt 150 lb (68 kg)   LMP 08/11/2013   SpO2 96%   BMI 25.95 kg/m   Physical Exam Vitals and nursing note reviewed.  Constitutional:      Appearance: Normal appearance.  Cardiovascular:     Rate and Rhythm: Normal rate and regular rhythm.     Pulses: Normal pulses.     Heart sounds: Normal heart sounds.  Pulmonary:     Effort: Pulmonary effort is normal.     Breath sounds: Normal breath sounds.  Neurological:     Mental Status: She is alert and oriented to person, place, and time.  Psychiatric:        Mood and Affect: Mood normal.        Behavior: Behavior normal.        Thought Content: Thought content normal.        Judgment: Judgment normal.         Assessment & Plan:  Establishing care with new doctor, encounter for Assessment & Plan: Reviewed EMR briefly.   Encounter for screening for malignant neoplasm of lung in former smoker who quit in past 15 years with 30 pack year history or greater -     Ambulatory Referral for Lung Cancer Scre  Need for influenza  vaccination -     Flu Vaccine QUAD High Dose(Fluad)  Need for pneumococcal 20-valent conjugate vaccination -     Pneumococcal conjugate vaccine 20-valent  Essential hypertension Assessment & Plan: BP at goal today.   Continue lisinopril 2.5 mg once daily.   Reviewed CMP from December.    Hyperlipidemia  associated with type 2 diabetes mellitus (HCC) Assessment & Plan: Controlled. Reviewed lipid panel from 12/02/22.   Continue Atorvastatin 80 mg once daily.    Postablative hypothyroidism Assessment & Plan: Controlled.  Followed by endocrinology.   Reviewed TSH from 02/04/23.  Continue Levothyroxine as prescribed.     CKD stage 2 due to type 2 diabetes mellitus (HCC) Assessment & Plan: Controlled.   Reviewed CMP from December.    LADA (latent autoimmune diabetes in adults), managed as type 1 (HCC) Assessment & Plan: Controlled. Last hemoglobin A1c was 6.1 in December.  Followed by endocrinology.   Foot exam completed today.   Continue Lantus 8 units once daily and Humalog sliding scale.  Follow up in 6 months.   Endometrial adenocarcinoma [facility]) Assessment & Plan: S/p TAH.  plan to monitor every 6 months.   will see Dr. Caleb Popp. Jennette Kettle alternating.  no adjunct therapy was recommended.  Continue to monitor     Return in about 6 months (around 11/22/2023) for chronic management.   [place] Charon, NP

## 2023-05-22 NOTE — Assessment & Plan Note (Addendum)
 BP at goal today.   Continue lisinopril 2.5 mg once daily.   Reviewed CMP from December.

## 2023-05-22 NOTE — Assessment & Plan Note (Signed)
 Controlled.   Reviewed CMP from December.

## 2023-05-22 NOTE — Patient Instructions (Signed)
 Follow up in 6 months.   It was a pleasure meeting you!

## 2023-05-22 NOTE — Assessment & Plan Note (Signed)
 Controlled. Reviewed lipid panel from 12/02/22.   Continue Atorvastatin 80 mg once daily.

## 2023-05-22 NOTE — Assessment & Plan Note (Signed)
 Reviewed EMR briefly.

## 2023-05-28 ENCOUNTER — Encounter: Payer: Medicare Other | Admitting: Nurse Practitioner

## 2023-06-02 ENCOUNTER — Other Ambulatory Visit: Payer: Self-pay | Admitting: General Practice

## 2023-06-02 MED ORDER — LISINOPRIL 2.5 MG PO TABS: ORAL_TABLET | ORAL | 1 refills | Status: DC

## 2023-06-02 NOTE — Telephone Encounter (Signed)
 Copied from CRM [id number]. Topic: Clinical - Medication Refill >> Jun 02, 2023  9:37 AM Marica Otter wrote: Most Recent Primary Care Visit:  Provider: [place] Charon  Department: [facility]  Visit Type: NEW PT - OFFICE VISIT  Date: 05/22/2023  Medication: lisinopril (ZESTRIL) 2.5 MG tablet  Has the patient contacted their pharmacy? Yes, Kathlene November with [facility] calling (Agent: If no, request that the patient contact the pharmacy for the refill. If patient does not wish to contact the pharmacy document the reason why and proceed with request.) (Agent: If yes, when and what did the pharmacy advise?)  Is this the correct pharmacy for this prescription? Yes If no, delete pharmacy and type the correct one.  This is the patient's preferred pharmacy:  [facility] [address] ([address] [place] - [address] [place] ([place]) [place] [address] Phone: [phone] Fax: [phone]   Has the prescription been filled recently? No  Is the patient out of the medication? Yes  Has the patient been seen for an appointment in the last year OR does the patient have an upcoming appointment? Yes  Can we respond through MyChart? No  Agent: Please be advised that Rx refills may take up to 3 business days. We ask that you follow-up with your pharmacy.

## 2023-06-12 ENCOUNTER — Telehealth: Payer: Self-pay | Admitting: *Deleted

## 2023-06-12 ENCOUNTER — Other Ambulatory Visit: Payer: Self-pay | Admitting: *Deleted

## 2023-06-12 DIAGNOSIS — Z87891 Personal history of nicotine dependence: Secondary | ICD-10-CM

## 2023-06-12 DIAGNOSIS — Z122 Encounter for screening for malignant neoplasm of respiratory organs: Secondary | ICD-10-CM

## 2023-06-12 NOTE — Telephone Encounter (Signed)
 Lung Cancer Screening Narrative/Criteria Questionnaire (Cigarette Smokers Only- No Cigars/Pipes/vapes)   [name]   SDMV:07/04/23 9:30- [name]                                           1955/09/27              LDCT: 07/07/23 10:00- [facility]    67 y.o.   Phone: [phone]  Lung Screening Narrative (confirm age 43-77 yrs Medicare / 50-80 yrs Private pay insurance)   Insurance information:UHC   Referring Provider:Kaur   This screening involves an initial phone call with a team member from our program. It is called a shared decision making visit. The initial meeting is required by insurance and Medicare to make sure you understand the program. This appointment takes about 15-20 minutes to complete. The CT scan will completed at a separate date/time. This scan takes about 5-10 minutes to complete and you may eat and drink before and after the scan.  Criteria questions for Lung Cancer Screening:   Are you a current or former smoker? Former Age began smoking: 42   If you are a former smoker, what year did you quit smoking? 2012 (within 15 yrs)   To calculate your smoking history, I need an accurate estimate of how many packs of cigarettes you smoked per day and for how many years. (Not just the number of PPD you are now smoking)   Years smoking 15 x Packs per day 1-1.5 = Pack years 20   (at least 20 pack yrs)   (Make sure they understand that we need to know how much they have smoked in the past, not just the number of PPD they are smoking now)  Do you have a personal history of cancer?  Yes - (type and when diagnosed - 5 yrs cancer free) endometrial (surgery)    Do you have a family history of cancer? Yes  (cancer type and and relative) MGM (breast)  Are you coughing up blood?  No  Have you had unexplained weight loss of 15 lbs or more in the last 6 months? No  It looks like you meet all criteria.     Additional information: N/A

## 2023-07-04 ENCOUNTER — Ambulatory Visit (INDEPENDENT_AMBULATORY_CARE_PROVIDER_SITE_OTHER): Admitting: Acute Care

## 2023-07-04 DIAGNOSIS — Z87891 Personal history of nicotine dependence: Secondary | ICD-10-CM

## 2023-07-04 NOTE — Patient Instructions (Signed)

## 2023-07-04 NOTE — Progress Notes (Addendum)
 Provider Attestation I agree with the documentation of the Shared Decision Making visit,  smoking cessation counseling if appropriate, and verification or eligibility for lung cancer screening as documented by the RN Nurse Navigator.   Lauraine PHEBE Lites, MSN, AGACNP-BC [place] [facility] See Amion for personal pager PCCM on call pager [phone]      Virtual Visit via Telephone Note  I connected with [name] on 07/04/23 at  9:30 AM EDT by telephone and verified that I am speaking with the correct person using two identifiers.  Location: Patient: Mary Frank Provider: Laneta Speaks, RN   I discussed the limitations, risks, security and privacy concerns of performing an evaluation and management service by telephone and the availability of in person appointments. I also discussed with the patient that there may be a patient responsible charge related to this service. The patient expressed understanding and agreed to proceed.   Shared Decision Making Visit Lung Cancer Screening Program [phone])   Eligibility: Age 68 y.o. Pack Years Smoking History Calculation 20 (# packs/per year x # years smoked) Recent History of coughing up blood  no Unexplained weight loss? no ( >Than 15 pounds within the last 6 months ) Prior History Lung / other cancer no (Diagnosis within the last 5 years already requiring surveillance chest CT Scans). Smoking Status Former Smoker Former Smokers: Years since quit: 13 years  Quit Date: 2012  Visit Components: Discussion included one or more decision making aids. yes Discussion included risk/benefits of screening. yes Discussion included potential follow up diagnostic testing for abnormal scans. yes Discussion included meaning and risk of over diagnosis. yes Discussion included meaning and risk of False Positives. yes Discussion included meaning of total radiation exposure. yes  Counseling Included: Importance of adherence  to annual lung cancer LDCT screening. yes Impact of comorbidities on ability to participate in the program. yes Ability and willingness to under diagnostic treatment. yes  Smoking Cessation Counseling: Current Smokers:  Discussed importance of smoking cessation. yes Information about tobacco cessation classes and interventions provided to patient. yes Patient provided with ticket for LDCT Scan. no Symptomatic Patient. no  Counseling(Intermediate counseling: > three minutes) 99406 Diagnosis Code: Tobacco Use Z72.0 Asymptomatic Patient yes  Counseling (Intermediate counseling: > three minutes counseling) [id number] Former Smokers:  Discussed the importance of maintaining cigarette abstinence. yes Diagnosis Code: Personal History of Nicotine Dependence. S12.108 Information about tobacco cessation classes and interventions provided to patient. Yes Patient provided with ticket for LDCT Scan. no Written Order for Lung Cancer Screening with LDCT placed in Epic. Yes (CT Chest Lung Cancer Screening Low Dose W/O CM) [id number] Z12.2-Screening of respiratory organs Z87.891-Personal history of nicotine dependence   Laneta Speaks, RN

## 2023-07-07 ENCOUNTER — Ambulatory Visit (HOSPITAL_COMMUNITY)
Admission: RE | Admit: 2023-07-07 | Discharge: 2023-07-07 | Disposition: A | Discharge status: Home / Self Care | Source: Ambulatory Visit | Attending: Acute Care | Admitting: Acute Care

## 2023-07-07 DIAGNOSIS — Z122 Encounter for screening for malignant neoplasm of respiratory organs: Secondary | ICD-10-CM | POA: Diagnosis present

## 2023-07-07 DIAGNOSIS — Z87891 Personal history of nicotine dependence: Secondary | ICD-10-CM | POA: Diagnosis present

## 2023-07-29 ENCOUNTER — Telehealth: Payer: Self-pay | Admitting: *Deleted

## 2023-07-29 DIAGNOSIS — R911 Solitary pulmonary nodule: Secondary | ICD-10-CM

## 2023-07-29 NOTE — Telephone Encounter (Signed)
 Call report from [facility]:   IMPRESSION: 1. Lung-RADS 3, probably benign findings. Short-term follow-up in 6 months is recommended with repeat low-dose chest CT without contrast (please use the following order, "CT CHEST LCS NODULE FOLLOW-UP W/O CM"). 2.  Emphysema (ICD10-J43.9).

## 2023-07-30 NOTE — Telephone Encounter (Signed)
 Called and spoke to pt. Informed her of the results of her first LDCT. We discussed the nodules noted and the emphysema. Patient verbalized understanding and is agreeable to 6 month follow up scan. Order placed for follow up scan. Result and plan sent to PCP.

## 2023-08-18 LAB — PROTEIN / CREATININE RATIO, URINE
Albumin, U: <7
Creatinine, Urine: 66

## 2023-08-18 LAB — HEMOGLOBIN A1C: Hemoglobin A1C: 6.3

## 2023-10-14 ENCOUNTER — Encounter: Admitting: General Practice

## 2023-10-24 ENCOUNTER — Ambulatory Visit (INDEPENDENT_AMBULATORY_CARE_PROVIDER_SITE_OTHER)

## 2023-10-24 VITALS — Ht 63.75 in | Wt 150.0 lb

## 2023-10-24 DIAGNOSIS — Z0000 Encounter for general adult medical examination without abnormal findings: Secondary | ICD-10-CM | POA: Diagnosis not present

## 2023-10-24 NOTE — Progress Notes (Signed)
 Subjective:   Mary Frank is a 68 y.o. who presents for a Medicare Wellness preventive visit.  As a reminder, Annual Wellness Visits don't include a physical exam, and some assessments may be limited, especially if this visit is performed virtually. We may recommend an in-person follow-up visit with your provider if needed.  Visit Complete: Virtual I connected with  Mary Frank on 10/24/23 by a audio enabled telemedicine application and verified that I am speaking with the correct person using two identifiers.  Patient Location: Home  Provider Location: Office/Clinic  I discussed the limitations of evaluation and management by telemedicine. The patient expressed understanding and agreed to proceed.  Vital Signs: Because this visit was a virtual/telehealth visit, some criteria may be missing or patient reported. Any vitals not documented were not able to be obtained and vitals that have been documented are patient reported.  VideoDeclined- This patient declined Librarian, academic. Therefore the visit was completed with audio only.  Persons Participating in Visit: Patient.  AWV Questionnaire: No: Patient Medicare AWV questionnaire was not completed prior to this visit.  Cardiac Risk Factors include: advanced age (>75men, >42 women);diabetes mellitus;dyslipidemia;hypertension     Objective:    Today's Vitals   10/24/23 1519  Weight: 150 lb (68 kg)  Height: 5' 3.75 (1.619 m)   Body mass index is 25.95 kg/m.     10/24/2023    3:30 PM 08/14/2021    2:33 PM 07/24/2020    2:51 PM 01/07/2020    3:27 PM 12/31/2019    8:57 AM 12/28/2019   11:03 AM 12/13/2019    7:02 AM  Advanced Directives  Does Patient Have a Medical Advance Directive? No No No No No No No  Does patient want to make changes to medical advance directive?  No - Patient declined  Yes (MAU/Ambulatory/Procedural Areas - Information given) Yes (MAU/Ambulatory/Procedural Areas - Information  given) Yes (MAU/Ambulatory/Procedural Areas - Information given) Yes (MAU/Ambulatory/Procedural Areas - Information given)  Would patient like information on creating a medical advance directive?  No - Patient declined No - Patient declined        Current Medications (verified) Outpatient Encounter Medications as of 10/24/2023  Medication Sig   aspirin  EC 81 MG tablet Take 1 tablet (81 mg total) by mouth daily. Do not take for one week post-op. Swallow whole.   atorvastatin  (LIPITOR) 80 MG tablet TAKE 1 TABLET BY MOUTH DAILY FOR CHOLESTEROL   Calcium  Citrate-Vitamin D  (CALCIUM  CITRATE + D3 PO) Take 600 mg by mouth daily.    Cholecalciferol 125 MCG (5000 UT) TABS Take 5,000 Units by mouth daily.   Continuous Blood Gluc Sensor (FREESTYLE LIBRE 2 SENSOR) MISC Inject 1 sensor to the skin every 14 days for continuous glucose monitoring.   ferrous fumarate (HEMOCYTE - 106 MG FE) 325 (106 FE) MG TABS Take 1 tablet by mouth daily.   insulin  glargine (LANTUS) 100 UNIT/ML Solostar Pen Inject 10-16 Units into the skin daily.    insulin  lispro (HUMALOG KWIKPEN) 100 UNIT/ML KwikPen Inject 0-4 Units into the skin 3 (three) times daily before meals. Sliding scale: under 120 = 0 units  120 - 170 = 1 units  171 - 220 = 2 units  221 - 270 = 3 units  271 - 320 = 4 units  over 320 = 4 units   levothyroxine  (SYNTHROID ) 137 MCG tablet TAKE 1 TABLET BY MOUTH DAILY EXCEPT TAKE 1/2 TABLET ON SUNDAY...TAKE 30-60 MINUTES PRIOR TO BREAKFAST ON EMPTY  STOMACH WITH WATER  ONLY (Patient taking differently: TAKE 1 TABLET DAILY EXCEPT ON TUESDAYS AND FRIDAYS, TAKE 1/2 TABLET. TAKE 30-60 MINUTES PRIOR TO BREAKFAST ON EMPTY STOMACH WITH WATER  ONLY)   lisinopril  (ZESTRIL ) 2.5 MG tablet TAKE 1 TABLET BY MOUTH ONCE DAILY FOR BLOOD PRESSURE AND KIDNEY PROTECTION   Magnesium 500 MG CAPS Take 500 mg by mouth daily.   Omega-3 Fatty Acids (FISH OIL ) 1200 MG CAPS Take 1 capsule (1,200 mg total) by mouth 2 (two) times daily.    Polyethylene Glycol 3350  (MIRALAX PO) Take by mouth daily.   estradiol (ESTRACE) 2 MG tablet Take 2 mg by mouth as directed. Takes on Mondays, Wednesdays and fridays   No facility-administered encounter medications on file as of 10/24/2023.    Allergies (verified) Patient has no known allergies.   History: Past Medical History:  Diagnosis Date   Diabetes mellitus without complication (HCC)    Endometrial adenocarcinoma (HCC)    Hyperlipidemia    Hypertension    Hypothyroidism    Iron deficiency anemia 01/17/2011   Menopause    Osteopenia    Peripheral vascular disease (HCC) 1990   stripping    Thyroid disease    Type II or unspecified type diabetes mellitus without mention of complication, not stated as uncontrolled 02/04/2013   Varicose veins    Varicose veins of lower extremities with complications 01/11/2014   Vitamin D  deficiency    Past Surgical History:  Procedure Laterality Date   COLONOSCOPY  12/13/2008   Dr.Brodie   COLONOSCOPY WITH PROPOFOL   11/24/2020   Pyrtle   ENDOMETRIAL ABLATION W/ NOVASURE  2015   Dr. Rosalynn   ROBOTIC ASSISTED TOTAL HYSTERECTOMY WITH BILATERAL SALPINGO OOPHERECTOMY Bilateral 12/13/2019   Procedure: XI ROBOTIC ASSISTED TOTAL HYSTERECTOMY WITH BILATERAL SALPINGO OOPHORECTOMY (SPECIMEN WEIGHING >250 GRAMS), MINI LAPAROTOMY;  Surgeon: Eloy Herring, MD;  Location: [facility];  Service: Gynecology;  Laterality: Bilateral;   SENTINEL NODE BIOPSY N/A 12/13/2019   Procedure: SENTINEL LYMPH NODE BIOPSY;  Surgeon: Eloy Herring, MD;  Location: [facility];  Service: Gynecology;  Laterality: N/A;   VEIN LIGATION AND STRIPPING Left 2017   VEIN LIGATION AND STRIPPING Right 2017   Family History  Problem Relation Age of Onset   Diabetes Mother    Fibromyalgia Mother    Arrhythmia Mother        Required chest compressions -   Osteoporosis Mother    Arthritis Mother    Hearing loss Mother    Hypertension Mother    Kidney  disease Mother    Colon polyps Father    Thyroid disease Father        hypothyroid   Heart attack Father 36   Colon polyps Sister    Thyroid disease Sister    Osteoporosis Sister    Breast cancer Maternal Grandmother 80   Cancer Maternal Grandmother    Anxiety disorder Daughter    Colitis Daughter    Depression Son    Irritable bowel syndrome Son    Colon cancer Neg Hx    Esophageal cancer Neg Hx    Rectal cancer Neg Hx    Stomach cancer Neg Hx    Ovarian cancer Neg Hx    Endometrial cancer Neg Hx    Pancreatic cancer Neg Hx    Prostate cancer Neg Hx    Social History   Socioeconomic History   Marital status: Married    Spouse name: Shaelyn Decarli   Number of children: 2  Years of education: 15   Highest education level: 12th grade  Occupational History   Not on file  Tobacco Use   Smoking status: Former    Current packs/day: 0.00    Average packs/day: 1 pack/day for 19.0 years (19.0 ttl pk-yrs)    Types: Cigarettes    Start date: 03/21/1991    Quit date: 03/20/2010    Years since quitting: 13.6   Smokeless tobacco: Never  Vaping Use   Vaping status: Never Used  Substance and Sexual Activity   Alcohol use: Yes    Alcohol/week: 2.0 standard drinks of alcohol    Types: 2 Glasses of wine per week    Comment: 2 glasses a wine once a week   Drug use: No   Sexual activity: Yes    Partners: Male    Birth control/protection: Post-menopausal, Surgical  Other Topics Concern   Not on file  Social History Narrative   Not on file   Social Drivers of Health   Financial Resource Strain: Low Risk  (10/24/2023)   Overall Financial Resource Strain (CARDIA)    Difficulty of Paying Living Expenses: Not hard at all  Food Insecurity: No Food Insecurity (10/24/2023)   Hunger Vital Sign    Worried About Running Out of Food in the Last Year: Never true    Ran Out of Food in the Last Year: Never true  Transportation Needs: No Transportation Needs (10/24/2023)   PRAPARE -  Transportation    Lack of Transportation (Medical): No    Lack of Transportation (Non-Medical): No  Physical Activity: Sufficiently Active (10/24/2023)   Exercise Vital Sign    Days of Exercise per Week: 6 days    Minutes of Exercise per Session: 60 min  Stress: No Stress Concern Present (10/24/2023)   [employer] of Occupational Health - Occupational Stress Questionnaire    Feeling of Stress: Not at all  Social Connections: Moderately Integrated (10/24/2023)   Social Connection and Isolation Panel    Frequency of Communication with Friends and Family: More than three times a week    Frequency of Social Gatherings with Friends and Family: More than three times a week    Attends Religious Services: More than 4 times per year    Active Member of [employer] or Organizations: No    Attends Engineer, structural: Never    Marital Status: Married    Tobacco Counseling Counseling given: Not Answered    Clinical Intake:  Pre-visit preparation completed: Yes  Pain : No/denies pain     BMI - recorded: 25.95 Nutritional Status: BMI 25 -29 Overweight Nutritional Risks: None Diabetes: Yes CBG done?: No Did pt. bring in CBG monitor from home?: No  Lab Results  Component Value Date   HGBA1C 6.1 02/04/2023   HGBA1C 6.4 (H) 12/02/2022   HGBA1C 6.2 (H) 05/28/2022     How often do you need to have someone help you when you read instructions, pamphlets, or other written materials from your doctor or pharmacy?: 1 - Never  Interpreter Needed?: No  Comments: lives alone Information entered by :: B.Peace Jost,LPN   Activities of Daily Living     10/24/2023    3:30 PM  In your present state of health, do you have any difficulty performing the following activities:  Hearing? 0  Vision? 0  Difficulty concentrating or making decisions? 0  Walking or climbing stairs? 0  Dressing or bathing? 0  Doing errands, shopping? 0  Preparing Food and eating ? N  Using the Toilet? N  In  the past six months, have you accidently leaked urine? N  Do you have problems with loss of bowel control? N  Managing your Medications? N  Managing your Finances? N  Housekeeping or managing your Housekeeping? N    Patient Care Team: Vincente Shivers, NP as PCP - General (General Practice) Lequita Evalene LABOR, MD as Consulting Physician (Obstetrics and Gynecology) Leila Bound, OD (Optometry)  I have updated your Care Teams any recent Medical Services you may have received from other providers in the past year.     Assessment:   This is a routine wellness examination for Angeni.  Hearing/Vision screen Hearing Screening - Comments:: Patient denies any hearing difficulties.   Vision Screening - Comments:: Pt says their vision is good with glasses &amp; contacts Dr  Scarlet   Goals Addressed             This Visit's Progress    COMPLETED: HEMOGLOBIN A1C < 7       COMPLETED: LDL CALC < 70       Patient Stated       I want to be healthier and spend more time with familty       Depression Screen     10/24/2023    3:27 PM 05/22/2023   10:42 AM 05/24/2021   10:10 AM 05/23/2020    9:10 AM 05/21/2019    9:54 AM 05/15/2018    9:39 AM 05/09/2017    9:43 AM  PHQ 2/9 Scores  PHQ - 2 Score 0 0 0 0 0 0 0  PHQ- 9 Score  0         Fall Risk     10/24/2023    3:22 PM 05/22/2023   10:42 AM 10/11/2016    7:14 PM 04/05/2016    7:04 PM 10/05/2015    8:50 AM  Fall Risk   Falls in the past year? 0 0 No  No  No   Number falls in past yr: 0 0     Injury with Fall? 0 0     Risk for fall due to : No Fall Risks No Fall Risks     Follow up Education provided;Falls prevention discussed Falls evaluation completed        Data saved with a previous flowsheet row definition    MEDICARE RISK AT HOME:  Medicare Risk at Home Any stairs in or around the home?: Yes If so, are there any without handrails?: Yes Home free of loose throw rugs in walkways, pet beds, electrical cords, etc?: Yes Adequate  lighting in your home to reduce risk of falls?: Yes Life alert?: No Use of a cane, walker or w/c?: No Grab bars in the bathroom?: Yes Shower chair or bench in shower?: Yes Elevated toilet seat or a handicapped toilet?: Yes  TIMED UP AND GO:  Was the test performed?  No  Cognitive Function: 6CIT completed        10/24/2023    3:31 PM  6CIT Screen  What Year? 0 points  What month? 0 points  What time? 0 points  Count back from 20 0 points  Months in reverse 0 points  Repeat phrase 0 points  Total Score 0 points    Immunizations Immunization History  Administered Date(s) Administered   Fluad Quad(high Dose 65+) 05/22/2023   Influenza Inj Mdck Quad With Preservative 12/11/2017   Influenza Split 12/06/2013, 12/08/2014   Influenza Whole 01/28/2012   Influenza,inj,Quad PF,6+ Mos  04/27/2020, 12/20/2020   Influenza-Unspecified 12/04/2015, 12/17/2016, 12/03/2018   Moderna Sars-Covid-2 Vaccination 05/09/2020   PFIZER(Purple Top)SARS-COV-2 Vaccination 05/27/2019, 06/17/2019   PNEUMOCOCCAL CONJUGATE-20 05/22/2023   PPD Test 02/08/2014, 03/15/2015, 04/05/2016   Pneumococcal Polysaccharide-23 12/08/2014   Td 04/05/2016   Tdap 03/04/2006   Zoster Recombinant(Shingrix) 12/11/2020, 04/02/2021    Screening Tests Health Maintenance  Topic Date Due   Diabetic kidney evaluation - Urine ACR  05/28/2023   HEMOGLOBIN A1C  08/05/2023   INFLUENZA VACCINE  10/03/2023   COVID-19 Vaccine (4 - 2024-25 season) 06/06/2027 (Originally 11/03/2022)   Diabetic kidney evaluation - eGFR measurement  02/04/2024   OPHTHALMOLOGY EXAM  03/09/2024   FOOT EXAM  05/21/2024   DEXA SCAN  06/18/2024   Medicare Annual Wellness (AWV)  10/23/2024   MAMMOGRAM  02/03/2025   DTaP/Tdap/Td (3 - Td or Tdap) 04/05/2026   Colonoscopy  12/14/2026   Pneumococcal Vaccine: 50+ Years  Completed   Hepatitis C Screening  Completed   Zoster Vaccines- Shingrix  Completed   HPV VACCINES  Aged Out   Meningococcal B Vaccine   Aged Out   Lung Cancer Screening  Discontinued    Health Maintenance  Health Maintenance Due  Topic Date Due   Diabetic kidney evaluation - Urine ACR  05/28/2023   HEMOGLOBIN A1C  08/05/2023   INFLUENZA VACCINE  10/03/2023   Health Maintenance Items Addressed: Urine ACR and HgbA1C to be done at Pcp at scheduled visit in Sept 2025   Additional Screening:  Vision Screening: Recommended annual ophthalmology exams for early detection of glaucoma and other disorders of the eye. Would you like a referral to an eye doctor? No    Dental Screening: Recommended annual dental exams for proper oral hygiene  Community Resource Referral / Chronic Care Management: CRR required this visit?  No   CCM required this visit?  No   Plan:    I have personally reviewed and noted the following in the patient's chart:   Medical and social history Use of alcohol, tobacco or illicit drugs  Current medications and supplements including opioid prescriptions. Patient is not currently taking opioid prescriptions. Functional ability and status Nutritional status Physical activity Advanced directives List of other physicians Hospitalizations, surgeries, and ER visits in previous 12 months Vitals Screenings to include cognitive, depression, and falls Referrals and appointments  In addition, I have reviewed and discussed with patient certain preventive protocols, quality metrics, and best practice recommendations. A written personalized care plan for preventive services as well as general preventive health recommendations were provided to patient.   Erminio LITTIE Saris, LPN   [id number]   After Visit Summary: (MyChart) Due to this being a telephonic visit, the after visit summary with patients personalized plan was offered to patient via MyChart   Notes: Nothing significant to report at this time.

## 2023-10-24 NOTE — Patient Instructions (Signed)
 Mary Frank , Thank you for taking time out of your busy schedule to complete your Annual Wellness Visit with me. I enjoyed our conversation and look forward to speaking with you again next year. I, as well as your care team,  appreciate your ongoing commitment to your health goals. Please review the following plan we discussed and let me know if I can assist you in the future. Your Game plan/ To Do List    Referrals: If you haven't heard from the office you've been referred to, please reach out to them at the phone provided.   Follow up Visits: We will see or speak with you next year for your Next Medicare AWV with our clinical staff-10/26/24 @ 3pm televisit Have you seen your provider in the last 6 months (3 months if uncontrolled diabetes)? Yes  Clinician Recommendations:  Aim for 30 minutes of exercise or brisk walking, 6-8 glasses of water , and 5 servings of fruits and vegetables each day. .      This is a list of the screenings recommended for you:  Health Maintenance  Topic Date Due   Yearly kidney health urinalysis for diabetes  05/28/2023   Hemoglobin A1C  08/05/2023   Flu Shot  10/03/2023   COVID-19 Vaccine (4 - 2024-25 season) 06/06/2027*   Yearly kidney function blood test for diabetes  02/04/2024   Eye exam for diabetics  03/09/2024   Complete foot exam   05/21/2024   DEXA scan (bone density measurement)  06/18/2024   Medicare Annual Wellness Visit  10/23/2024   Mammogram  02/03/2025   DTaP/Tdap/Td vaccine (3 - Td or Tdap) 04/05/2026   Colon Cancer Screening  12/14/2026   Pneumococcal Vaccine for age over 60  Completed   Hepatitis C Screening  Completed   Zoster (Shingles) Vaccine  Completed   HPV Vaccine  Aged Out   Meningitis B Vaccine  Aged Out   Screening for Lung Cancer  Discontinued  *Topic was postponed. The date shown is not the original due date.    Advanced directives: (Declined) Advance directive discussed with you today. Even though you declined this today,  please call our office should you change your mind, and we can give you the proper paperwork for you to fill out. Advance Care Planning is important because it:  [x]  Makes sure you receive the medical care that is consistent with your values, goals, and preferences  [x]  It provides guidance to your family and loved ones and reduces their decisional burden about whether or not they are making the right decisions based on your wishes.  Follow the link provided in your after visit summary or read over the paperwork we have mailed to you to help you started getting your Advance Directives in place. If you need assistance in completing these, please reach out to us  so that we can help you!

## 2023-11-24 ENCOUNTER — Ambulatory Visit: Admitting: General Practice

## 2023-11-25 ENCOUNTER — Encounter: Payer: Self-pay | Admitting: General Practice

## 2023-11-25 ENCOUNTER — Ambulatory Visit (INDEPENDENT_AMBULATORY_CARE_PROVIDER_SITE_OTHER): Admitting: General Practice

## 2023-11-25 VITALS — BP 122/72 | HR 65 | Temp 98.1°F | Ht 63.75 in | Wt 153.0 lb

## 2023-11-25 DIAGNOSIS — N182 Chronic kidney disease, stage 2 (mild): Secondary | ICD-10-CM

## 2023-11-25 DIAGNOSIS — I10 Essential (primary) hypertension: Secondary | ICD-10-CM | POA: Diagnosis not present

## 2023-11-25 DIAGNOSIS — Z23 Encounter for immunization: Secondary | ICD-10-CM | POA: Diagnosis not present

## 2023-11-25 DIAGNOSIS — E1169 Type 2 diabetes mellitus with other specified complication: Secondary | ICD-10-CM | POA: Diagnosis not present

## 2023-11-25 DIAGNOSIS — E559 Vitamin D deficiency, unspecified: Secondary | ICD-10-CM

## 2023-11-25 DIAGNOSIS — E139 Other specified diabetes mellitus without complications: Secondary | ICD-10-CM

## 2023-11-25 DIAGNOSIS — E1122 Type 2 diabetes mellitus with diabetic chronic kidney disease: Secondary | ICD-10-CM | POA: Diagnosis not present

## 2023-11-25 DIAGNOSIS — E785 Hyperlipidemia, unspecified: Secondary | ICD-10-CM

## 2023-11-25 MED ORDER — ATORVASTATIN CALCIUM 80 MG PO TABS
ORAL_TABLET | ORAL | 1 refills | Status: AC
Start: 2023-11-25 — End: ?

## 2023-11-25 NOTE — Patient Instructions (Addendum)
 Schedule lab only appt on your way out. Come fasting 4 hours prior. Only black coffee or water  is allowed.   Call and discuss Lung Cancer screening CT Scan - Phone: [phone]  Follow up in 6 months.   It was a pleasure to see you today!

## 2023-11-25 NOTE — Addendum Note (Signed)
 Addended by: TENNIE RAISIN B on: 11/25/2023 11:17 AM   Modules accepted: Orders

## 2023-11-25 NOTE — Progress Notes (Signed)
 Established Patient Office Visit  Subjective   Patient ID: Mary Frank, female    DOB: 28-Mar-1955  Age: 68 y.o. MRN: [id number]  Chief Complaint  Patient presents with   Diabetes    Patient here today to follow up on DM; taking lantus and humalog. Sees endo every 6 months and last A1c was in June.    Hypertension    Patient following up on BP; taking lisinopril      Diabetes Pertinent negatives for hypoglycemia include no dizziness, headaches or nervousness/anxiousness. Pertinent negatives for diabetes include no chest pain and no polydipsia.  Hypertension Pertinent negatives include no chest pain, headaches or shortness of breath.    Mary Frank is a 68 year old female with past medical history of HTN, HLD, LADA, hypothyroidism, CKD, osteopenia, endometrial adenocarcinoma, presents today for chronic care management.  Discussed the use of AI scribe software for clinical note transcription with the patient, who gave verbal consent to proceed.  History of Present Illness She has type 1 diabetes and is managed by endocrinology. Her last A1c was 6.3 in June, up from 6.1. She has not had an A1c test since December. Her blood sugar levels fluctuate, with morning readings as high as 195 mg/dL. She attributes these fluctuations to being taken off estradiol by her oncologist due to her cancer history, leading to continuous hot flashes since August.She is currently on Lantus, taking 7 to 9 units, and Humalog, taking 4 units before meals with additional doses as needed. She uses a Libre 3 for continuous glucose monitoring.   She also takes lisinopril  2.5 mg for blood pressure management. No concerns.  She has a history of hypothyroidism and is on levothyroxine  137 mcg, with a half tablet taken twice a week.   She also has a history of vitamin D  deficiency, though it is unclear when it was last checked.  She has a history of HLD, currently managed on Atorvastatin . She needs refill.      Patient Active Problem List   Diagnosis Date Noted   Establishing care with new doctor, encounter for 05/22/2023   History of adenomatous polyp of colon 05/24/2021   Constipation 07/25/2020   Anisocoria 05/23/2020   Endometrial adenocarcinoma (HCC) 11/24/2019   Osteopenia 11/16/2018   CKD stage 2 due to type 2 diabetes mellitus (HCC) 05/15/2018   Systolic murmur 05/09/2017   Vitamin D  deficiency 12/05/2013   Medication management 12/05/2013   Essential hypertension 02/04/2013   Hyperlipidemia associated with type 2 diabetes mellitus (HCC) 02/04/2013   LADA (latent autoimmune diabetes in adults), managed as type 1 (HCC) 02/04/2013   Hypothyroidism 02/04/2013   Past Medical History:  Diagnosis Date   Diabetes mellitus without complication (HCC)    Endometrial adenocarcinoma (HCC)    Hyperlipidemia    Hypertension    Hypothyroidism    Iron deficiency anemia 01/17/2011   Menopause    Osteopenia    Peripheral vascular disease 1990   stripping    Thyroid disease    Type II or unspecified type diabetes mellitus without mention of complication, not stated as uncontrolled 02/04/2013   Varicose veins    Varicose veins of lower extremities with complications 01/11/2014   Vitamin D  deficiency    Past Surgical History:  Procedure Laterality Date   COLONOSCOPY  12/13/2008   Dr.Brodie   COLONOSCOPY WITH PROPOFOL   11/24/2020   Pyrtle   ENDOMETRIAL ABLATION W/ NOVASURE  2015   Dr. Rosalynn   ROBOTIC ASSISTED TOTAL HYSTERECTOMY WITH BILATERAL SALPINGO  OOPHERECTOMY Bilateral 12/13/2019   Procedure: XI ROBOTIC ASSISTED TOTAL HYSTERECTOMY WITH BILATERAL SALPINGO OOPHORECTOMY (SPECIMEN WEIGHING >250 GRAMS), MINI LAPAROTOMY;  Surgeon: Eloy Herring, MD;  Location: [facility];  Service: Gynecology;  Laterality: Bilateral;   SENTINEL NODE BIOPSY N/A 12/13/2019   Procedure: SENTINEL LYMPH NODE BIOPSY;  Surgeon: Eloy Herring, MD;  Location: [facility];  Service:  Gynecology;  Laterality: N/A;   VEIN LIGATION AND STRIPPING Left 2017   VEIN LIGATION AND STRIPPING Right 2017   No Known Allergies       10/24/2023    3:27 PM 05/22/2023   10:42 AM 05/24/2021   10:10 AM  Depression screen PHQ 2/9  Decreased Interest 0 0 0  Down, Depressed, Hopeless 0 0 0  PHQ - 2 Score 0 0 0  Altered sleeping  0   Tired, decreased energy  0   Change in appetite  0   Feeling bad or failure about yourself   0   Trouble concentrating  0   Moving slowly or fidgety/restless  0   Suicidal thoughts  0   PHQ-9 Score  0   Difficult doing work/chores  Not difficult at all        05/22/2023   10:43 AM  GAD 7 : Generalized Anxiety Score  Nervous, Anxious, on Edge 0  Control/stop worrying 0  Worry too much - different things 0  Trouble relaxing 0  Restless 0  Easily annoyed or irritable 0  Afraid - awful might happen 0  Total GAD 7 Score 0  Anxiety Difficulty Not difficult at all      Review of Systems  Constitutional:  Negative for chills and fever.  Respiratory:  Negative for shortness of breath.   Cardiovascular:  Negative for chest pain.  Gastrointestinal:  Negative for abdominal pain, constipation, diarrhea, heartburn, nausea and vomiting.  Genitourinary:  Negative for dysuria, frequency and urgency.  Neurological:  Negative for dizziness and headaches.  Endo/Heme/Allergies:  Negative for polydipsia.  Psychiatric/Behavioral:  Negative for depression and suicidal ideas. The patient is not nervous/anxious.       Objective:     BP 122/72   Pulse 65   Temp 98.1 F (36.7 C) (Oral)   Ht 5' 3.75 (1.619 m)   Wt 153 lb (69.4 kg)   LMP 08/11/2013   SpO2 99%   BMI 26.47 kg/m  BP Readings from Last 3 Encounters:  11/25/23 122/72  05/22/23 110/70  12/02/22 112/68   Wt Readings from Last 3 Encounters:  11/25/23 153 lb (69.4 kg)  10/24/23 150 lb (68 kg)  05/22/23 150 lb (68 kg)      Physical Exam Vitals and nursing note reviewed.   Constitutional:      Appearance: Normal appearance.  Cardiovascular:     Rate and Rhythm: Normal rate and regular rhythm.     Pulses: Normal pulses.     Heart sounds: Normal heart sounds.  Pulmonary:     Effort: Pulmonary effort is normal.     Breath sounds: Normal breath sounds.  Neurological:     Mental Status: She is alert and oriented to person, place, and time.  Psychiatric:        Mood and Affect: Mood normal.        Behavior: Behavior normal.        Thought Content: Thought content normal.        Judgment: Judgment normal.      Results for orders placed or performed in  visit on 11/25/23  Protein / creatinine ratio, urine  Result Value Ref Range   Creatinine, Urine 66   Hemoglobin A1c  Result Value Ref Range   Hemoglobin A1C 6.3   Protein / creatinine ratio, urine  Result Value Ref Range   Albumin, U <7        The 10-year ASCVD risk score (Arnett DK, et al., 2019) is: 11.6%    Assessment & Plan:  Essential hypertension  Hyperlipidemia associated with type 2 diabetes mellitus (HCC) -     Atorvastatin  Calcium ; TAKE 1 TABLET BY MOUTH DAILY FOR CHOLESTEROL  Dispense: 90 tablet; Refill: 1 -     Lipid panel; Future  CKD stage 2 due to type 2 diabetes mellitus (HCC)  LADA (latent autoimmune diabetes in adults), managed as type 1 (HCC) -     Microalbumin / creatinine urine ratio; Future  Vitamin D  deficiency -     VITAMIN D  25 Hydroxy (Vit-D Deficiency, Fractures); Future    Assessment and Plan Assessment & Plan Type 1 diabetes mellitus Recent increase in fasting blood glucose likely due to estradiol discontinuation. A1c increased from 6.1 to 6.3. Managed with insulin  glargine and lispro under endocrinology care. - Continue insulin  glargine 7-9 units daily per endocrinology.  - Continue insulin  lispro 4 units before meals, with additional doses as needed for hyperglycemia  per endocrinology.  - Reviewed labs in care everywhere.  - Urine ACR pending.  -  follow up in 6 months.  Chronic kidney disease stage 2 CKD stage 2 due to diabetes, managed with lisinopril  for renal protection. - Urine ACR pending.  Essential hypertension Blood pressure well-controlled on current regimen. - Continue lisinopril  2.5 mg daily.  Hyperlipidemia Due for cholesterol check. Managed with atorvastatin . - lipid panel pending. - Continue atorvastatin  80 mg daily.  Hypothyroidism Postablative hypothyroidism managed with levothyroxine . - Continue current levothyroxine  regimen per endocrinology.  Vitamin D  deficiency - Vitamin d  level pending.  Pulmonary nodules and emphysema - Contact [facility] to schedule low dose CT scan for November 6th. Phone number provided.    Return in about 6 months (around 05/24/2024) for physical..    Carrol Aurora, NP

## 2023-11-27 ENCOUNTER — Ambulatory Visit

## 2023-11-27 ENCOUNTER — Other Ambulatory Visit: Payer: Self-pay | Admitting: General Practice

## 2023-11-27 ENCOUNTER — Other Ambulatory Visit (INDEPENDENT_AMBULATORY_CARE_PROVIDER_SITE_OTHER)

## 2023-11-27 DIAGNOSIS — E1169 Type 2 diabetes mellitus with other specified complication: Secondary | ICD-10-CM

## 2023-11-27 DIAGNOSIS — E559 Vitamin D deficiency, unspecified: Secondary | ICD-10-CM | POA: Diagnosis not present

## 2023-11-27 DIAGNOSIS — E139 Other specified diabetes mellitus without complications: Secondary | ICD-10-CM

## 2023-11-27 DIAGNOSIS — E785 Hyperlipidemia, unspecified: Secondary | ICD-10-CM

## 2023-11-27 LAB — LIPID PANEL
Cholesterol: 157 mg/dL (ref 0–200)
HDL: 62.4 mg/dL (ref >39.00)
LDL Cholesterol: 84 mg/dL (ref 0–99)
NonHDL: 94.14
Total CHOL/HDL Ratio: 3
Triglycerides: 50 mg/dL (ref 0.0–149.0)
VLDL: 10 mg/dL (ref 0.0–40.0)

## 2023-11-27 LAB — VITAMIN D 25 HYDROXY (VIT D DEFICIENCY, FRACTURES): VITD: 64.05 ng/mL (ref 30.00–100.00)

## 2023-11-27 LAB — MICROALBUMIN / CREATININE URINE RATIO
Creatinine,U: 55.9 mg/dL
Microalb Creat Ratio: UNDETERMINED mg/g (ref 0.0–30.0)
Microalb, Ur: <0.7 mg/dL

## 2023-11-28 ENCOUNTER — Ambulatory Visit: Payer: Self-pay | Admitting: General Practice

## 2024-01-08 ENCOUNTER — Ambulatory Visit (HOSPITAL_COMMUNITY)
Admission: RE | Admit: 2024-01-08 | Discharge: 2024-01-08 | Disposition: A | Discharge status: Home / Self Care | Source: Ambulatory Visit | Attending: Acute Care | Admitting: Acute Care

## 2024-01-08 DIAGNOSIS — R911 Solitary pulmonary nodule: Secondary | ICD-10-CM | POA: Insufficient documentation

## 2024-01-15 ENCOUNTER — Telehealth: Payer: Self-pay | Admitting: Acute Care

## 2024-01-15 ENCOUNTER — Other Ambulatory Visit: Payer: Self-pay

## 2024-01-15 DIAGNOSIS — R911 Solitary pulmonary nodule: Secondary | ICD-10-CM

## 2024-01-15 NOTE — Telephone Encounter (Signed)
 LR 3 in May 2025, 6.8 mm LR 2 in November 2025>> Growth of 7.5 mm in 6 months.  At this rate of growth, may be big enough to PET scan in 6 months. She is a former smoker , quit 2012 , with a 20 pack year smoking history.  Lets do a 6 month follow up. Ask if she has had any unintentional weigh loss or hemoptysis.  Please fax results to PCP and let them know plan. Thanks so much.

## 2024-01-15 NOTE — Telephone Encounter (Signed)
 Spoke with patient by phone to review results of recent LDCT.  Our pulmonary provider reviewed the results and found the nodule to have grown since the last scan.  Recommendation to follow the nodule at another 6 months interval as precaution, due to growth.  Patient in agreement.  She has not had any symptoms of illness or weight loss.  Order placed for follow up LDCT and results/plan faxed to PCP.  LDCT follow up has been scheduled for 07/07/24

## 2024-01-15 NOTE — Telephone Encounter (Signed)
 Attempted to call patient and review results. No answer, LVM to call the office.

## 2024-05-27 ENCOUNTER — Ambulatory Visit: Admitting: General Practice

## 2024-07-07 ENCOUNTER — Encounter (HOSPITAL_COMMUNITY)

## 2024-10-26 ENCOUNTER — Ambulatory Visit
